# Patient Record
Sex: Female | Born: 1960 | Race: Black or African American | Hispanic: No | Marital: Single | Smoking: Current some day smoker
Health system: Southern US, Community
[De-identification: ages and names within clinical notes are randomized; demographics above are authoritative.]

## PROBLEM LIST (undated history)

## (undated) DIAGNOSIS — K589 Irritable bowel syndrome without diarrhea: Secondary | ICD-10-CM

## (undated) DIAGNOSIS — F32A Depression, unspecified: Secondary | ICD-10-CM

## (undated) DIAGNOSIS — I10 Essential (primary) hypertension: Secondary | ICD-10-CM

## (undated) DIAGNOSIS — F419 Anxiety disorder, unspecified: Secondary | ICD-10-CM

## (undated) DIAGNOSIS — F329 Major depressive disorder, single episode, unspecified: Secondary | ICD-10-CM

## (undated) DIAGNOSIS — F4310 Post-traumatic stress disorder, unspecified: Secondary | ICD-10-CM

## (undated) DIAGNOSIS — C801 Malignant (primary) neoplasm, unspecified: Secondary | ICD-10-CM

## (undated) DIAGNOSIS — E559 Vitamin D deficiency, unspecified: Secondary | ICD-10-CM

## (undated) HISTORY — DX: Irritable bowel syndrome, unspecified: K58.9

## (undated) HISTORY — DX: Post-traumatic stress disorder, unspecified: F43.10

## (undated) HISTORY — DX: Vitamin D deficiency, unspecified: E55.9

## (undated) HISTORY — PX: BREAST SURGERY: SHX581

## (undated) HISTORY — PX: CHOLECYSTECTOMY: SHX55

---

## 1997-12-15 ENCOUNTER — Inpatient Hospital Stay (HOSPITAL_COMMUNITY): Admission: EM | Admit: 1997-12-15 | Discharge: 1997-12-23 | Payer: Self-pay | Admitting: Emergency Medicine

## 2000-12-09 ENCOUNTER — Other Ambulatory Visit: Admission: RE | Admit: 2000-12-09 | Discharge: 2000-12-09 | Payer: Self-pay | Admitting: Obstetrics and Gynecology

## 2001-03-01 ENCOUNTER — Inpatient Hospital Stay (HOSPITAL_COMMUNITY): Admission: EM | Admit: 2001-03-01 | Discharge: 2001-03-08 | Payer: Self-pay | Admitting: *Deleted

## 2002-12-05 ENCOUNTER — Encounter: Payer: Self-pay | Admitting: Emergency Medicine

## 2002-12-05 ENCOUNTER — Emergency Department (HOSPITAL_COMMUNITY): Admission: EM | Admit: 2002-12-05 | Discharge: 2002-12-05 | Payer: Self-pay | Admitting: *Deleted

## 2003-01-16 ENCOUNTER — Ambulatory Visit (HOSPITAL_COMMUNITY): Admission: RE | Admit: 2003-01-16 | Discharge: 2003-01-16 | Payer: Self-pay | Admitting: *Deleted

## 2003-01-16 ENCOUNTER — Encounter: Payer: Self-pay | Admitting: *Deleted

## 2004-04-17 ENCOUNTER — Other Ambulatory Visit: Payer: Self-pay

## 2004-04-17 ENCOUNTER — Emergency Department: Payer: Self-pay | Admitting: Emergency Medicine

## 2004-06-19 ENCOUNTER — Emergency Department: Payer: Self-pay | Admitting: Emergency Medicine

## 2004-06-26 ENCOUNTER — Emergency Department: Payer: Self-pay | Admitting: Unknown Physician Specialty

## 2005-01-15 ENCOUNTER — Emergency Department: Payer: Self-pay | Admitting: Emergency Medicine

## 2005-07-06 ENCOUNTER — Emergency Department: Payer: Self-pay | Admitting: Unknown Physician Specialty

## 2007-06-19 ENCOUNTER — Emergency Department: Payer: Self-pay

## 2007-08-02 ENCOUNTER — Ambulatory Visit: Payer: Self-pay

## 2009-02-06 ENCOUNTER — Ambulatory Visit: Payer: Self-pay | Admitting: Pain Medicine

## 2011-09-24 ENCOUNTER — Emergency Department: Payer: Self-pay | Admitting: Emergency Medicine

## 2011-09-24 LAB — COMPREHENSIVE METABOLIC PANEL
Albumin: 4.1 g/dL (ref 3.4–5.0)
BUN: 15 mg/dL (ref 7–18)
Calcium, Total: 9.6 mg/dL (ref 8.5–10.1)
Chloride: 106 mmol/L (ref 98–107)
Co2: 25 mmol/L (ref 21–32)
EGFR (African American): >60
EGFR (Non-African Amer.): 59 — ABNORMAL LOW
Glucose: 87 mg/dL (ref 65–99)
Osmolality: 278 (ref 275–301)
Total Protein: 8.6 g/dL — ABNORMAL HIGH (ref 6.4–8.2)

## 2011-09-24 LAB — CBC
HCT: 40.8 % (ref 35.0–47.0)
HGB: 12.7 g/dL (ref 12.0–16.0)
MCV: 73 fL — ABNORMAL LOW (ref 80–100)

## 2011-09-24 LAB — SALICYLATE LEVEL: Salicylates, Serum: 1.9 mg/dL

## 2011-09-24 LAB — TSH: Thyroid Stimulating Horm: 12.3 u[IU]/mL — ABNORMAL HIGH

## 2011-09-24 LAB — ETHANOL: Ethanol %: <0.003 % (ref 0.000–0.080)

## 2011-09-24 LAB — ACETAMINOPHEN LEVEL: Acetaminophen: <2 ug/mL

## 2011-09-25 LAB — T4, FREE: Free Thyroxine: 0.84 ng/dL

## 2011-09-26 LAB — URINALYSIS, COMPLETE
Bacteria: NONE SEEN
Blood: NEGATIVE
Glucose,UR: NEGATIVE mg/dL (ref 0–75)
Ketone: NEGATIVE
Protein: NEGATIVE
Specific Gravity: 1.021 (ref 1.003–1.030)
Squamous Epithelial: 36
WBC UR: 8 /HPF (ref 0–5)

## 2011-09-26 LAB — DRUG SCREEN, URINE
Amphetamines, Ur Screen: NEGATIVE (ref ?–1000)
Benzodiazepine, Ur Scrn: NEGATIVE (ref ?–200)
Methadone, Ur Screen: NEGATIVE (ref ?–300)

## 2012-10-15 ENCOUNTER — Emergency Department: Payer: Self-pay | Admitting: Emergency Medicine

## 2013-06-10 ENCOUNTER — Emergency Department: Payer: Self-pay | Admitting: Internal Medicine

## 2013-08-30 ENCOUNTER — Emergency Department: Payer: Self-pay | Admitting: Emergency Medicine

## 2013-08-30 LAB — DRUG SCREEN, URINE
Amphetamines, Ur Screen: NEGATIVE (ref ?–1000)
Barbiturates, Ur Screen: NEGATIVE (ref ?–200)
Benzodiazepine, Ur Scrn: NEGATIVE (ref ?–200)
CANNABINOID 50 NG, UR ~~LOC~~: POSITIVE (ref ?–50)
Cocaine Metabolite,Ur ~~LOC~~: POSITIVE (ref ?–300)
MDMA (Ecstasy)Ur Screen: NEGATIVE (ref ?–500)
Methadone, Ur Screen: NEGATIVE (ref ?–300)
Opiate, Ur Screen: NEGATIVE (ref ?–300)
Phencyclidine (PCP) Ur S: NEGATIVE (ref ?–25)
Tricyclic, Ur Screen: NEGATIVE (ref ?–1000)

## 2013-08-30 LAB — URINALYSIS, COMPLETE
Bacteria: NONE SEEN
Bilirubin,UR: NEGATIVE
Blood: NEGATIVE
Glucose,UR: NEGATIVE mg/dL (ref 0–75)
Leukocyte Esterase: NEGATIVE
NITRITE: NEGATIVE
PH: 5 (ref 4.5–8.0)
Protein: NEGATIVE
RBC,UR: NONE SEEN /HPF (ref 0–5)
SPECIFIC GRAVITY: 1.028 (ref 1.003–1.030)
Squamous Epithelial: 4
WBC UR: <1 /HPF (ref 0–5)

## 2013-08-30 LAB — ACETAMINOPHEN LEVEL: Acetaminophen: <2 ug/mL

## 2013-08-30 LAB — COMPREHENSIVE METABOLIC PANEL
AST: 38 U/L — AB (ref 15–37)
Albumin: 3.9 g/dL (ref 3.4–5.0)
Alkaline Phosphatase: 59 U/L
Anion Gap: 3 — ABNORMAL LOW (ref 7–16)
BUN: 16 mg/dL (ref 7–18)
Bilirubin,Total: 0.5 mg/dL (ref 0.2–1.0)
CALCIUM: 9.3 mg/dL (ref 8.5–10.1)
CREATININE: 1.13 mg/dL (ref 0.60–1.30)
Chloride: 108 mmol/L — ABNORMAL HIGH (ref 98–107)
Co2: 28 mmol/L (ref 21–32)
EGFR (African American): >60
GFR CALC NON AF AMER: 55 — AB
Glucose: 92 mg/dL (ref 65–99)
Osmolality: 278 (ref 275–301)
Potassium: 4.3 mmol/L (ref 3.5–5.1)
SGPT (ALT): 29 U/L (ref 12–78)
Sodium: 139 mmol/L (ref 136–145)
Total Protein: 8.2 g/dL (ref 6.4–8.2)

## 2013-08-30 LAB — CBC
HCT: 37.6 % (ref 35.0–47.0)
HGB: 12 g/dL (ref 12.0–16.0)
MCH: 23.3 pg — ABNORMAL LOW (ref 26.0–34.0)
MCHC: 31.9 g/dL — AB (ref 32.0–36.0)
MCV: 73 fL — AB (ref 80–100)
PLATELETS: 233 10*3/uL (ref 150–440)
RBC: 5.15 10*6/uL (ref 3.80–5.20)
RDW: 15.5 % — AB (ref 11.5–14.5)
WBC: 6.3 10*3/uL (ref 3.6–11.0)

## 2013-08-30 LAB — CK TOTAL AND CKMB (NOT AT ARMC)
CK, Total: 511 U/L — ABNORMAL HIGH
CK-MB: 6.1 ng/mL — ABNORMAL HIGH (ref 0.5–3.6)

## 2013-08-30 LAB — ETHANOL

## 2013-08-30 LAB — D-DIMER(ARMC): D-DIMER: 725 ng/mL

## 2013-08-30 LAB — TSH: Thyroid Stimulating Horm: 5.5 u[IU]/mL — ABNORMAL HIGH

## 2013-08-30 LAB — LIPASE, BLOOD: Lipase: 105 U/L (ref 73–393)

## 2013-08-30 LAB — TROPONIN I: Troponin-I: <0.02 ng/mL

## 2013-08-30 LAB — SALICYLATE LEVEL: Salicylates, Serum: 2.5 mg/dL

## 2013-08-31 LAB — TROPONIN I

## 2013-12-19 LAB — CBC
HCT: 38.9 % (ref 35.0–47.0)
HGB: 12.2 g/dL (ref 12.0–16.0)
MCH: 22.5 pg — ABNORMAL LOW (ref 26.0–34.0)
MCHC: 31.3 g/dL — AB (ref 32.0–36.0)
MCV: 72 fL — ABNORMAL LOW (ref 80–100)
Platelet: 225 10*3/uL (ref 150–440)
RBC: 5.4 10*6/uL — AB (ref 3.80–5.20)
RDW: 14.1 % (ref 11.5–14.5)
WBC: 4.9 10*3/uL (ref 3.6–11.0)

## 2013-12-19 LAB — COMPREHENSIVE METABOLIC PANEL
ALBUMIN: 3.4 g/dL (ref 3.4–5.0)
ALT: 21 U/L
ANION GAP: 7 (ref 7–16)
AST: 27 U/L (ref 15–37)
Alkaline Phosphatase: 61 U/L
BUN: 14 mg/dL (ref 7–18)
Bilirubin,Total: 0.3 mg/dL (ref 0.2–1.0)
CO2: 26 mmol/L (ref 21–32)
CREATININE: 1.23 mg/dL (ref 0.60–1.30)
Calcium, Total: 9.2 mg/dL (ref 8.5–10.1)
Chloride: 108 mmol/L — ABNORMAL HIGH (ref 98–107)
EGFR (Non-African Amer.): 49 — ABNORMAL LOW
GFR CALC AF AMER: 57 — AB
GLUCOSE: 70 mg/dL (ref 65–99)
OSMOLALITY: 280 (ref 275–301)
Potassium: 3.7 mmol/L (ref 3.5–5.1)
Sodium: 141 mmol/L (ref 136–145)
Total Protein: 7.7 g/dL (ref 6.4–8.2)

## 2013-12-19 LAB — DRUG SCREEN, URINE
Amphetamines, Ur Screen: NEGATIVE (ref ?–1000)
Barbiturates, Ur Screen: NEGATIVE (ref ?–200)
Benzodiazepine, Ur Scrn: NEGATIVE (ref ?–200)
Cannabinoid 50 Ng, Ur ~~LOC~~: POSITIVE (ref ?–50)
Cocaine Metabolite,Ur ~~LOC~~: POSITIVE (ref ?–300)
MDMA (Ecstasy)Ur Screen: NEGATIVE (ref ?–500)
Methadone, Ur Screen: NEGATIVE (ref ?–300)
OPIATE, UR SCREEN: NEGATIVE (ref ?–300)
PHENCYCLIDINE (PCP) UR S: NEGATIVE (ref ?–25)
Tricyclic, Ur Screen: NEGATIVE (ref ?–1000)

## 2013-12-19 LAB — URINALYSIS, COMPLETE
BLOOD: NEGATIVE
Bilirubin,UR: NEGATIVE
Glucose,UR: 50 mg/dL (ref 0–75)
Ketone: NEGATIVE
Leukocyte Esterase: NEGATIVE
Nitrite: NEGATIVE
PROTEIN: NEGATIVE
Ph: 6 (ref 4.5–8.0)
RBC,UR: 4 /HPF (ref 0–5)
Specific Gravity: 1.021 (ref 1.003–1.030)
Squamous Epithelial: 2
WBC UR: 1 /HPF (ref 0–5)

## 2013-12-19 LAB — ETHANOL

## 2013-12-19 LAB — SALICYLATE LEVEL: Salicylates, Serum: 1.9 mg/dL

## 2013-12-19 LAB — ACETAMINOPHEN LEVEL

## 2013-12-20 ENCOUNTER — Inpatient Hospital Stay: Payer: Self-pay | Admitting: Psychiatry

## 2013-12-22 LAB — TSH: THYROID STIMULATING HORM: 2.97 u[IU]/mL

## 2013-12-22 LAB — HEMOGLOBIN A1C: Hemoglobin A1C: 7 % — ABNORMAL HIGH (ref 4.2–6.3)

## 2013-12-22 LAB — LIPID PANEL
Cholesterol: 160 mg/dL (ref 0–200)
HDL Cholesterol: 57 mg/dL (ref 40–60)
LDL CHOLESTEROL, CALC: 88 mg/dL (ref 0–100)
Triglycerides: 76 mg/dL (ref 0–200)
VLDL Cholesterol, Calc: 15 mg/dL (ref 5–40)

## 2014-02-21 ENCOUNTER — Emergency Department: Payer: Self-pay | Admitting: Emergency Medicine

## 2014-07-10 ENCOUNTER — Emergency Department: Payer: Self-pay | Admitting: Student

## 2014-09-07 NOTE — Consult Note (Signed)
Brief Consult Note: Diagnosis: PTSD.   Patient was seen by consultant.   Consult note dictated.   Discussed with Attending MD.   Comments: Psychiatry: PAtient seen. Chart reviewed. Denies any thought of hurting self Or of hurting boyfriend. Calm and lucid and appropriate current behavior. Boyfriend reports not believing she will hurt hhim . Has outpt treatment. Will release IVC.  Electronic Signatures: Clapacs, Madie Reno (MD)  (Signed 17-Apr-15 15:05)  Authored: Brief Consult Note   Last Updated: 17-Apr-15 15:05 by Gonzella Lex (MD)

## 2014-09-07 NOTE — Consult Note (Signed)
PATIENT NAME:  Helen Reid, FESTA MR#:  [id number] DATE OF BIRTH:  09-May-1961  DATE OF CONSULTATION:  12/20/2013  REFERRING PHYSICIAN:   CONSULTING PHYSICIAN:  Gonzella Lex, MD  IDENTIFYING INFORMATION AND REASON FOR CONSULT: This is a 54 year old woman who came into the Emergency Room reporting suicidal ideation and also having taken an overdose of an unknown number of pills. Consultation for appropriate disposition.   HISTORY OF PRESENT ILLNESS: History obtained from the patient and the chart and the collateral information from her home health aide. The patient tells me that she has been feeling terribly depressed for at least 5 days or so, been having suicidal ideation. She claims now that she tried to kill herself by taking a bunch of pills, but does not remember what they are. She apparently had not reported this before. She identifies multiple stresses, feeling like she is all alone, nobody cares about her, her children do not pay attention to her. It also sounds like she probably had a break-up with her boyfriend with whom she has a chronically tumultuous relationship. She complains about her chronic pain, which has been a long-standing issue. Claims that she is still taking all of her medication as prescribed. Still gets in home care on a regular basis.   PAST PSYCHIATRIC HISTORY: Long-standing psychiatric illness. Multiple hospitalizations in the past, but it has several years since then. She has had a couple of evaluations at our Emergency Room, however. Her diagnoses include multiple personality disorder and posttraumatic stress disorder, apparently. She does have a history of suicide attempts in the past. Has a history of aggressive behavior and threatening behavior in the past as well. Currently, she is taking trazodone 150 mg at night, Abilify 10 mg once a day, etodolac 400 mg once a day, fenofibrate 135 mg once a day, gabapentin 400 mg 3 times a day, levothyroxine 200 mcg per day.    ALLERGIES: ASPIRIN, DILANTIN, ERYTHROMYCIN AND PHENOBARBITAL.  HOME CARE: She is followed by a peer support and in-home caretaker with care coordinated by [facility].   PAST MEDICAL HISTORY: Hypothyroid and chronic pain. It looks like her chronic pain has been attributed vaguely to fibromyalgia or osteoarthritis in the past. She is not currently getting any pain medicine besides the etodolac.   SOCIAL HISTORY: The patient is not married, just broke up with her boyfriend. She has had a chaotic relationship with him in the past that has at times involved threatening behavior. She does have 4 children, but they do not stay in touch with her. She is living independently, but has in home care regularly, not working.   FAMILY HISTORY: None identified.   SUBSTANCE ABUSE HISTORY: The patient admits to frequent use of marijuana, minimizes this, does not seem to think it is a problem. Drinks relatively infrequently and denies other drug abuse.   REVIEW OF SYSTEMS: Depressed mood, hopelessness, suicidal ideation with ongoing wish to die. Denies hallucinations. Has diffuse pain all over, especially in her hands and knees.   CURRENT MEDICATIONS: Venlafaxine 75 mg once a day, Amitiza 24 mcg twice a day, trazodone 150 mg at night, Abilify 10 mg a day, etodolac 400 mg once a day, fenofibrate 135 mg once a day, gabapentin 400 mg 3 times a day, levothyroxine 200 mcg per day.   MENTAL STATUS EXAMINATION: Adequately groomed woman, looks her stated age. Today she was initially hostile again but warmed up and was gradually able to give some history. Speech is decreased in total amount and quiet,  but became more normal as we conversed. Affect tearful throughout, very sad. Psychomotor activity very slow and sluggish. Prefers to stay in bed curled up. Mood is depressed. Thoughts are lucid. No obvious delusions. Denies hallucinations. Endorses suicidal ideation. No homicidal intent right now, although she talks a lot about  hating people. The patient is alert and oriented x4. Short-and long term memory appear grossly intact. Normal intelligence.   LABORATORY RESULTS: Labs were done yesterday in the Emergency Room and included drug screen positive for cocaine and cannabis. Chemistry panel: No significant abnormalities. Alcohol level negative. CBC: Low MCV at 72, otherwise fairly unremarkable. Urinalysis borderline, but probably not infected.   ASSESSMENT: A 54 year old woman with post-traumatic stress disorder, multiple personality disorder, borderline and antisocial features. Currently very sad and depressed, tearful and endorsing suicidal ideation and in need of hospital treatment. I saw her yesterday in the ER and she was very angry and agitated then, but today she is able to cooperate with treatment planning.   TREATMENT PLAN: Admit to psychiatry. Reviewed medications. Continue current medicines as ordered. Reviewed lab studies. Her Tylenol level was negative so whatever it was that she supposedly took in overdose did not include acetaminophen. Encouraged the patient to engage herself in groups on the unit and talk with her treatment team downstairs.   DIAGNOSIS, PRINCIPAL AND PRIMARY:  AXIS I: Major depression, recurrent, severe.   SECONDARY DIAGNOSES: AXIS I:  1.  Posttraumatic stress disorder. 2.  Multiple personality disorder by history.  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  AXIS III: Hypothyroid, chronic pain, diffuse arthritis.  ____________________________ Gonzella Lex, MD jtc:sb D: 12/20/2013 15:54:12 ET T: 12/20/2013 16:13:47 ET JOB#: [id number]  cc: Gonzella Lex, MD, <Dictator> Gonzella Lex MD ELECTRONICALLY SIGNED 01/25/2014 16:54

## 2014-09-07 NOTE — H&P (Signed)
PATIENT NAME:  Helen Reid, Helen Reid MR#:  [id number] DATE OF BIRTH:  Sep 22, 1960  DATE OF ADMISSION:  12/20/2013  DATE OF EVALUATION: 12/21/2013  REFERRING PHYSICIAN: Emergency Room MD   ATTENDING PHYSICIAN: Rye Decoste B. Bary Leriche, MD   IDENTIFYING DATA: Helen Reid is a 54 year old female with history of bipolar depression.   CHIEF COMPLAINT: "I'm suicidal."   HISTORY OF PRESENT ILLNESS: Helen Reid has a long history of mental illness with multiple hospitalizations. She has been stable on her medications prescribed by Dr. Jacqualine Code at [facility]. However, in the past week, with new stressors, she became increasingly depressed, anxious, and suicidal. She was thinking of hanging herself. She works with [facility] support team and they recommended that she comes to the hospital. She struggles with multiple life problems, is separated from her 4 children and does not see them very much, but most importantly she terminated a relationship with no good boyfriend after 8 years. It was harder than she anticipated. She complains of poor sleep, decreased appetite, anhedonia, feeling of guilt, hopelessness, worthlessness, poor memory and concentration, low energy, social isolation, crying spells and now suicidal ideation. She denies excessive drinking or illicit substance use. She denies psychotic symptoms, denies symptoms suggestive of bipolar mania. Her anxiety is a little worse, but no panic attacks.   PAST PSYCHIATRIC HISTORY: She has been hospitalized multiple times since she was a child. There are several suicide attempts by overdose and cutting. No hanging so far. She has been tried on multiple medications and feels that the current combination has been working well, but maybe it needs to be adjusted given recent events.   FAMILY PSYCHIATRIC HISTORY: None reported.   PAST MEDICAL HISTORY: Obesity, constipation, osteoarthritis, dyslipidemia, hypothyroidism, diabetes, restless leg syndrome.   ALLERGIES: ASPIRIN,  DILANTIN, ERYTHROMYCIN, PENICILLIN, PHENOBARBITAL, PEANUTS, STRAWBERRIES.  MEDICATIONS ON ADMISSION: Colace 100 mg daily, etodolac 400 mg daily, fenofibrate 145 mg daily, Neurontin 400 mg 3 times daily, Synthroid 0.2 mg daily, senna 1 tablet daily, Abilify 10 mg daily, Amitiza 24 mcg daily, Effexor-XR 75 mg daily.   SOCIAL HISTORY: She lives independently. She has community support and a home health nurse coming to her house for 3 hours a day to help her with her ADLs. She is disabled. She has insurance.   REVIEW OF SYSTEMS: CONSTITUTIONAL: No fevers or chills. Positive for fatigue.  EYES: No double or blurred vision.  ENT: No hearing loss.  RESPIRATORY: No shortness of breath or cough.  CARDIOVASCULAR: No chest pain or orthopnea.  GASTROINTESTINAL: No abdominal pain, nausea, vomiting, or diarrhea.  GENITOURINARY: No incontinence or frequency.  ENDOCRINE: No heat or cold intolerance.  LYMPHATIC: No anemia or easy bruising.  INTEGUMENTARY: No acne or rash.  MUSCULOSKELETAL: No muscle or joint pain.  NEUROLOGIC: No tingling or weakness.  PSYCHIATRIC: See history of present illness for details.   PHYSICAL EXAMINATION: VITAL SIGNS: Blood pressure 108/55, pulse 54, respirations 16, temperature 98.3.  GENERAL: This is an obese female in no acute distress.  HEENT: The pupils are equal, round and reactive to light. Sclerae anicteric.  NECK: Supple. No thyromegaly.  LUNGS: Clear to auscultation. No dullness to percussion.  HEART: Regular rhythm and rate. No murmurs, rubs, or gallops.  ABDOMEN: Soft, nontender, nondistended. Positive bowel sounds.  MUSCULOSKELETAL: Normal muscle strength in all extremities.  SKIN: No rashes or bruises.  LYMPHATIC: No cervical adenopathy.  NEUROLOGIC: Cranial nerves II through XII are intact.   LABORATORY DATA: Chemistries are within normal limits. Blood alcohol level is zero. LFTs  within normal limits. Urine tox screen positive for cocaine and cannabinoids.  CBC within normal limits with MCV of 72. Urinalysis is not suggestive of urinary tract infection. Serum acetaminophen and salicylates are low.   MENTAL STATUS EXAMINATION ON ADMISSION: The patient is alert and oriented to person, place, time and situation. She is pleasant, polite and marginally cooperative. There is severe psychomotor retardation. No eye contact. She claims that she has multiple personality disorder, but allows me to speak to [place]. Her grooming is questionable, wearing hospital scrubs. Her speech is slow, poverty of speech. Mood is depressed with flat affect. Thought process is logical with its own logic. She endorses suicidal ideation, but is able to contract for safety. In the hospital, there is no homicidal ideation. She denies delusions or paranoia. There are no auditory or visual hallucinations. Her cognition is not easy to assess but seems normal. Registration and recall are normal. She is a fair historian. Long and short-term memory intact. She is of average intelligence and fund of knowledge. She has fair insight and judgment.   SUICIDE RISK ASSESSMENT: This is a patient with history of mood instability and multiple suicide attempts who came to the hospital suicidal again in the context of multiple social stressors and relationship problems.   INITIAL DIAGNOSES:  AXIS I:  1.  Bipolar I disorder, depressed. 2.  Cocaine abuse.  3.  Cannabis abuse. 4.  Nicotine dependence.  AXIS II: Personality disorder, not otherwise specified.  AXIS III: Diabetes, restless leg syndrome, osteoarthritis, constipation, hypothyroidism, dyslipidemia.  AXIS IV: Mental illness, primary support, relationship, family conflict.  AXIS V: Global assessment of functioning 25.   PLAN: The patient was admitted to [facility] unit for safety, stabilization and medication management. She was initially placed on suicide precautions and was closely monitored for any  unsafe behavior. She underwent full psychiatric and risk assessment. She received pharmacotherapy, individual and group psychotherapy, substance abuse counseling, and support from therapeutic milieu.  1.  Suicidal ideation: She is able to contract for safety.  2.  Mood: We will increase Abilify to 20 mg daily and add Tegretol for mood stabilization. Will increase Effexor-XR to 150, maybe to 25.  3.  Medical: We will continue all other medicines as in the community.  4.  Insomnia: We will add Ambien. 5.  Restless leg: We will start Requip.  6.  Disposition: She will be discharged back to her apartment with [facility] at follow-up.  ____________________________ Wardell Honour. Bary Leriche, MD jbp:sb D: 12/21/2013 16:10:43 ET T: 12/21/2013 16:47:03 ET JOB#: [id number]  cc: Emryn Flanery B. Bary Leriche, MD, <Dictator> Clovis Fredrickson MD ELECTRONICALLY SIGNED 12/29/2013 2:32

## 2014-09-07 NOTE — Consult Note (Signed)
PATIENT NAME:  Helen Reid, BIDDLE MR#:  [id number] DATE OF BIRTH:  1961/02/20  DATE OF CONSULTATION:  08/31/2013  REFERRING PHYSICIAN:   CONSULTING PHYSICIAN:  Gonzella Lex, MD  IDENTIFYING INFORMATION AND REASON FOR CONSULTATION: A 54 year old woman with chronic mental health issues who came to the Emergency Room initially complaining of chest pain, but then was put on IVC because of allegations that she had threatened her boyfriend. Consultation for appropriate treatment.   HISTORY OF PRESENT ILLNESS: Information obtained from the patient and the chart. The patient is said to have allegedly threatened to shoot her boyfriend yesterday when she went to see her therapist at [facility]. She has consistently denied making this comment since she has been here in the Emergency Room. To me she absolutely denied ever making any such complaint. She said she has been angry at him because she thinks that he stole her gun, but denies having any thought of wanting to hurt him. She says that her mood generally has been pretty good. Not overly depressed, not manic or enraged. She sleeps fine with her current medicine. She does not have any new physical symptoms. She denies hallucinations. Denies suicidal ideation. The patient gets follow-up treatment for her multiple personality disorder and chronic anxiety disorder at [facility] and takes medication.   PAST PSYCHIATRIC HISTORY: The patient has had a long history of psychiatric problem going back many years. She has had hospitalizations in the past, but it has been years since her last one. She denies any recent suicide attempts. She says she has been aggressive in the past to people who have been violent to her first, but has not been violent in a long time. She is currently being seen at [facility] by a therapist and also takes by her report Abilify, Neurontin, gabapentin, and Effexor prescribed by Dr. Jacqualine Code. She was seen in our Emergency Room once previously a couple of years ago and was  released at that time.   SUBSTANCE ABUSE HISTORY: She admits that she drinks occasionally but says that alcohol is not a regular problem. Uses marijuana on a fairly regular basis, which has been chronic. Denies any other substance abuse.   SOCIAL HISTORY: She lives with her boyfriend. She is on disability. Does not have very much else going on in her life most of the time.   FAMILY HISTORY: Positive for substance abuse.   PAST MEDICAL HISTORY: States that she has hypothyroidism. Does not identify any other medical problems.   REVIEW OF SYSTEMS: Currently states that her mood is a little bit irritated but not enraged. Denies depression. Denies suicidal or homicidal ideation. Denies hallucinations. Denies any new acute pain or gut symptoms. The whole rest of the 10 point review of systems is negative.   MENTAL STATUS EXAMINATION: Slightly disheveled woman, looks her stated age or younger, cooperative with the interview. Eye contact good. Psychomotor activity a little fidgety. Speech is loud at times but not pressured. Affect irritated but not hostile or threatening. Appropriately modulated. Mood stated as being good. Thoughts are lucid with no loosening of associations. No sign of delusional thinking. Denies auditory or visual hallucinations. Denies suicidal or homicidal ideation. Says that she understands the trouble she can get into from being violent with someone and says that she is not stupid enough to do something that would get her in jail. She is alert and oriented x4. Short and long-term memory intact to testing. Normal fund of knowledge.   LABORATORY RESULTS: Labs performed yesterday include elevated  TSH of 5.5. Lipase normal. Drug screen positive for cocaine and cannabis. Chemistry panel: Chloride elevated at 108, total CK elevated 511. Hematology panel: Low MCV but otherwise normal indices for the most part. Troponin is negative.   ASSESSMENT: A 54 year old woman with a history of chronic  anxiety symptoms, diagnosis of multiple personality disorder and PTSD, went to see her therapist today, made a comment that was interpreted as threatening to her boyfriend. Since then she has consistently denied it. Right now she does not appear to be manic or psychotic. Absolutely denies any intention to hurt anyone. Nursing contacted the boyfriend yesterday and he said he did not feel afraid of her and was not concerned that he was in any danger. At this point, she appears to be at her baseline mental state. She has been educated about the importance of following up with continued outpatient treatment, which she is already aware of. She no longer meets commitment criteria and can be discharged from the Emergency Room with follow-up at [facility].   DIAGNOSIS, PRINCIPAL AND PRIMARY:  AXIS I: Posttraumatic stress disorder.   SECONDARY DIAGNOSES: AXIS I: Dissociative identity disorder.  AXIS II: Deferred.  AXIS III: Hypothyroidism. AXIS IV: Moderate, chronic from illness.  AXIS V: Functioning at time of evaluation is 54. ____________________________ Gonzella Lex, MD jtc:sb D: 08/31/2013 15:12:32 ET T: 08/31/2013 17:19:01 ET JOB#: [id number]  cc: Gonzella Lex, MD, <Dictator> Gonzella Lex MD ELECTRONICALLY SIGNED 09/02/2013 13:18

## 2014-09-07 NOTE — Consult Note (Signed)
Brief Consult Note: Diagnosis: ptsd.   Patient was seen by consultant.   Recommend further assessment or treatment.   Comments: Psychiatry: Attempted to see patient. She at first would not talk and then abruptly became hotile and threatening. Not possible to safely evvaluate patient at this time. Will reevaluate once she is calmer. Will make sure outpt meds ordered.  Electronic Signatures: Gonzella Lex (MD)  (Signed 05-Aug-15 15:11)  Authored: Brief Consult Note   Last Updated: 05-Aug-15 15:11 by Gonzella Lex (MD)
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
## 2014-10-09 ENCOUNTER — Ambulatory Visit: Payer: Medicaid Other | Admitting: Anesthesiology

## 2014-10-17 ENCOUNTER — Ambulatory Visit (INDEPENDENT_AMBULATORY_CARE_PROVIDER_SITE_OTHER): Payer: Medicaid Other | Admitting: Family Medicine

## 2014-10-17 ENCOUNTER — Other Ambulatory Visit: Payer: Self-pay | Admitting: Family Medicine

## 2014-10-17 ENCOUNTER — Encounter: Payer: Self-pay | Admitting: Family Medicine

## 2014-10-17 VITALS — BP 118/70 | HR 69 | Temp 98.0°F | Resp 16 | Ht 64.0 in | Wt 161.6 lb

## 2014-10-17 DIAGNOSIS — K581 Irritable bowel syndrome with constipation: Secondary | ICD-10-CM | POA: Insufficient documentation

## 2014-10-17 DIAGNOSIS — R21 Rash and other nonspecific skin eruption: Secondary | ICD-10-CM | POA: Diagnosis not present

## 2014-10-17 DIAGNOSIS — J449 Chronic obstructive pulmonary disease, unspecified: Secondary | ICD-10-CM | POA: Insufficient documentation

## 2014-10-17 DIAGNOSIS — M2550 Pain in unspecified joint: Secondary | ICD-10-CM | POA: Insufficient documentation

## 2014-10-17 DIAGNOSIS — N182 Chronic kidney disease, stage 2 (mild): Secondary | ICD-10-CM | POA: Insufficient documentation

## 2014-10-17 DIAGNOSIS — E039 Hypothyroidism, unspecified: Secondary | ICD-10-CM | POA: Insufficient documentation

## 2014-10-17 DIAGNOSIS — E781 Pure hyperglyceridemia: Secondary | ICD-10-CM | POA: Insufficient documentation

## 2014-10-17 DIAGNOSIS — I351 Nonrheumatic aortic (valve) insufficiency: Secondary | ICD-10-CM | POA: Insufficient documentation

## 2014-10-17 MED ORDER — BACITRACIN-NEOMYCIN-POLYMYXIN 400-5-5000 EX OINT: 1.0000 "application " | TOPICAL_OINTMENT | Freq: Two times a day (BID) | CUTANEOUS | Status: DC
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
## 2014-10-28 ENCOUNTER — Telehealth: Payer: Self-pay | Admitting: Family Medicine

## 2014-10-28 DIAGNOSIS — R21 Rash and other nonspecific skin eruption: Secondary | ICD-10-CM

## 2014-10-28 MED ORDER — DOXYCYCLINE MONOHYDRATE 100 MG PO TABS: 100.0000 mg | ORAL_TABLET | Freq: Two times a day (BID) | ORAL | Status: DC

## 2014-10-28 MED ORDER — BACITRACIN-NEOMYCIN-POLYMYXIN 400-5-5000 EX OINT: 1.0000 "application " | TOPICAL_OINTMENT | Freq: Two times a day (BID) | CUTANEOUS | Status: DC

## 2014-10-28 NOTE — Telephone Encounter (Signed)
Patient has been notified about her prescriptions.

## 2014-10-28 NOTE — Telephone Encounter (Signed)
Sent in Doxycycline and Neosporin Rx but her insurance may not cover OTC medications like Neosporin and Benadryl which can be found OTC.

## 2014-12-09 ENCOUNTER — Telehealth: Payer: Self-pay | Admitting: Family Medicine

## 2014-12-09 NOTE — Telephone Encounter (Signed)
Referral to pain management placed 09/25/2014 in [employer] EMR. Advise patient to contact pain clinic directly to inquire about appointment status.

## 2014-12-09 NOTE — Telephone Encounter (Signed)
Checking status on referral for Pain Clinic. The pain clinic called the patient about 70months ago stating that the doctor had not completed his training and that someone would call her back. As of today she has not heard anything. Please advise

## 2014-12-16 ENCOUNTER — Telehealth: Payer: Self-pay | Admitting: Family Medicine

## 2015-01-01 NOTE — Telephone Encounter (Signed)
Erroneous Entry  

## 2015-02-04 ENCOUNTER — Encounter: Payer: Self-pay | Admitting: Emergency Medicine

## 2015-02-04 ENCOUNTER — Emergency Department
Admission: EM | Admit: 2015-02-04 | Discharge: 2015-02-04 | Disposition: A | Discharge status: Home / Self Care | Payer: Medicaid Other | Attending: Emergency Medicine | Admitting: Emergency Medicine

## 2015-02-04 DIAGNOSIS — Z79899 Other long term (current) drug therapy: Secondary | ICD-10-CM | POA: Insufficient documentation

## 2015-02-04 DIAGNOSIS — F419 Anxiety disorder, unspecified: Secondary | ICD-10-CM | POA: Insufficient documentation

## 2015-02-04 DIAGNOSIS — R51 Headache: Secondary | ICD-10-CM | POA: Insufficient documentation

## 2015-02-04 DIAGNOSIS — Z7952 Long term (current) use of systemic steroids: Secondary | ICD-10-CM | POA: Insufficient documentation

## 2015-02-04 DIAGNOSIS — K2970 Gastritis, unspecified, without bleeding: Secondary | ICD-10-CM | POA: Insufficient documentation

## 2015-02-04 DIAGNOSIS — Z880 Allergy status to penicillin: Secondary | ICD-10-CM | POA: Insufficient documentation

## 2015-02-04 DIAGNOSIS — Z720 Tobacco use: Secondary | ICD-10-CM | POA: Insufficient documentation

## 2015-02-04 DIAGNOSIS — R112 Nausea with vomiting, unspecified: Secondary | ICD-10-CM | POA: Diagnosis present

## 2015-02-04 DIAGNOSIS — I129 Hypertensive chronic kidney disease with stage 1 through stage 4 chronic kidney disease, or unspecified chronic kidney disease: Secondary | ICD-10-CM | POA: Diagnosis not present

## 2015-02-04 DIAGNOSIS — N182 Chronic kidney disease, stage 2 (mild): Secondary | ICD-10-CM | POA: Diagnosis not present

## 2015-02-04 HISTORY — DX: Depression, unspecified: F32.A

## 2015-02-04 HISTORY — DX: Essential (primary) hypertension: I10

## 2015-02-04 HISTORY — DX: Major depressive disorder, single episode, unspecified: F32.9

## 2015-02-04 HISTORY — DX: Anxiety disorder, unspecified: F41.9

## 2015-02-04 HISTORY — DX: Malignant (primary) neoplasm, unspecified: C80.1

## 2015-02-04 LAB — LIPASE, BLOOD: Lipase: 33 U/L (ref 22–51)

## 2015-02-04 LAB — BASIC METABOLIC PANEL
Anion gap: 6 (ref 5–15)
BUN: 14 mg/dL (ref 6–20)
CHLORIDE: 106 mmol/L (ref 101–111)
CO2: 30 mmol/L (ref 22–32)
CREATININE: 1.44 mg/dL — AB (ref 0.44–1.00)
Calcium: 9.3 mg/dL (ref 8.9–10.3)
GFR calc Af Amer: 46 mL/min — ABNORMAL LOW (ref >60)
GFR calc non Af Amer: 40 mL/min — ABNORMAL LOW (ref >60)
Glucose, Bld: 76 mg/dL (ref 65–99)
Potassium: 4.2 mmol/L (ref 3.5–5.1)
SODIUM: 142 mmol/L (ref 135–145)

## 2015-02-04 LAB — URINALYSIS COMPLETE WITH MICROSCOPIC (ARMC ONLY)
BILIRUBIN URINE: NEGATIVE
GLUCOSE, UA: NEGATIVE mg/dL
Hgb urine dipstick: NEGATIVE
KETONES UR: NEGATIVE mg/dL
Nitrite: NEGATIVE
Protein, ur: NEGATIVE mg/dL
Specific Gravity, Urine: 1.016 (ref 1.005–1.030)
pH: 6 (ref 5.0–8.0)

## 2015-02-04 LAB — CBC
HCT: 35.6 % (ref 35.0–47.0)
Hemoglobin: 11.3 g/dL — ABNORMAL LOW (ref 12.0–16.0)
MCH: 22.9 pg — AB (ref 26.0–34.0)
MCHC: 31.6 g/dL — ABNORMAL LOW (ref 32.0–36.0)
MCV: 72.6 fL — AB (ref 80.0–100.0)
PLATELETS: 176 10*3/uL (ref 150–440)
RBC: 4.91 MIL/uL (ref 3.80–5.20)
RDW: 16.9 % — AB (ref 11.5–14.5)
WBC: 3.9 10*3/uL (ref 3.6–11.0)

## 2015-02-04 MED ORDER — ONDANSETRON 4 MG PO TBDP
4.0000 mg | ORAL_TABLET | Freq: Once | ORAL | Status: AC | PRN
  Administered 2015-02-04: 4 mg via ORAL
  Filled 2015-02-04: qty 1

## 2015-02-04 MED ORDER — ONDANSETRON HCL 4 MG PO TABS: 4.0000 mg | ORAL_TABLET | Freq: Every day | ORAL | Status: DC | PRN

## 2015-02-04 NOTE — ED Notes (Signed)
Pt reports dizziness and vomiting today; reports hasn't been on medications for 3 months (anxiety, depression, hypertension).

## 2015-02-04 NOTE — ED Notes (Signed)
States she has had headaches body aches with nausea and vomiting for a "while" last time vomited was in lobby ..states her MD is at   [facility] but does not have transportation . Also has not take her meds for 2-3 mos. Also having some chest discomfort

## 2015-02-04 NOTE — ED Provider Notes (Signed)
[facility] Emergency Department Provider Note  ____________________________________________  Time seen: 12 PM  I have reviewed the triage vital signs and the nursing notes.   HISTORY  Chief Complaint Emesis and Dizziness    HPI Helen Reid is a 54 y.o. female who complains of "all over pain "anxiety and intermittent headache for 3 months and she has not been taking her medications for 3 months because she says she develops hypersensitivity to them. She denies fevers chills. She has had nausea today but reports she has nausea every day. Apparently she vomited in the waiting room. She asks me to figure out what is wrong with her. She denies chest pain she denies short of breath. It appears that most of her concerns are chronic unfortunately     Past Medical History  Diagnosis Date  . Hypertension   . Anxiety   . Depression   . Cancer     lymphoma, gallbladder, breast    Patient Active Problem List   Diagnosis Date Noted  . CAFL (chronic airflow limitation) 10/17/2014  . Chronic kidney disease (CKD), stage II (mild) 10/17/2014  . Hypertriglyceridemia 10/17/2014  . Adult hypothyroidism 10/17/2014  . Insomnia, persistent 10/17/2014  . Anemia, iron deficiency 10/17/2014  . Irritable bowel syndrome with constipation 10/17/2014  . Major depressive disorder, recurrent, with catatonic features 10/17/2014  . AI (aortic incompetence) 10/17/2014  . Arthralgia of multiple joints 10/17/2014  . Cutaneous eruption 10/17/2014    Past Surgical History  Procedure Laterality Date  . Cholecystectomy    . Breast surgery      Current Outpatient Rx  Name  Route  Sig  Dispense  Refill  . doxycycline (ADOXA) 100 MG tablet   Oral   Take 1 tablet (100 mg total) by mouth 2 (two) times daily.   14 tablet   0   . etodolac (LODINE) 400 MG tablet   Oral   Take 1 tablet by mouth every 6 (six) hours as needed.         . fenofibrate micronized (LOFIBRA) 134 MG  capsule   Oral   Take 1 capsule by mouth daily.         . ferrous sulfate 324 (65 FE) MG TBEC   Oral   Take 1 tablet by mouth daily.         Marland Kitchen gabapentin (NEURONTIN) 400 MG capsule   Oral   Take 1 capsule by mouth 3 (three) times daily.         . hydrocortisone cream 1 %   Topical   Apply 1 application topically 2 (two) times daily.         Marland Kitchen levothyroxine (SYNTHROID, LEVOTHROID) 200 MCG tablet   Oral   Take 1 tablet by mouth daily before breakfast.         . lubiprostone (AMITIZA) 24 MCG capsule   Oral   Take 1 tablet by mouth daily.         Marland Kitchen neomycin-bacitracin-polymyxin (NEOSPORIN) ointment   Topical   Apply 1 application topically every 12 (twelve) hours.   15 g   1   . venlafaxine XR (EFFEXOR-XR) 75 MG 24 hr capsule   Oral   Take 1 capsule by mouth daily.           Allergies Aspirin; Dilantin ; Macrolides and ketolides; Phenobarbital; and Penicillins  No family history on file.  Social History Social History  Substance Use Topics  . Smoking status: Current Some Day Smoker  Types: Cigarettes  . Smokeless tobacco: None  . Alcohol Use: No    Review of Systems  Constitutional: Negative for fever. Eyes: Negative for visual changes. ENT: Negative for sore throat Cardiovascular: Negative for chest pain. Respiratory: Negative for shortness of breath. Gastrointestinal: Positive for nausea vomiting Genitourinary: Negative for dysuria. Musculoskeletal: Negative for back pain. Positive for joint pain Skin: Negative for rash. Neurological: Positive for intermittent headaches Psychiatric: Anxiety, denies suicidal ideation    ____________________________________________   PHYSICAL EXAM:  VITAL SIGNS: ED Triage Vitals  Enc Vitals Group     BP 02/04/15 1101 130/63 mmHg     Pulse Rate 02/04/15 1101 50     Resp 02/04/15 1101 20     Temp 02/04/15 1101 97.6 F (36.4 C)     Temp Source 02/04/15 1101 Oral     SpO2 02/04/15 1101 100 %      Weight 02/04/15 1101 160 lb (72.576 kg)     Height 02/04/15 1101 5\' 6"  (1.676 m)     Head Cir --      Peak Flow --      Pain Score 02/04/15 1102 10     Pain Loc --      Pain Edu? --      Excl. in [place]? --      Constitutional: Alert and oriented. Well appearing and in no distress. Eyes: Conjunctivae are normal.  ENT   Head: Normocephalic and atraumatic.   Mouth/Throat: Mucous membranes are moist. Cardiovascular: Normal rate, bradycardia rhythm. Normal and symmetric distal pulses are present in all extremities. No murmurs, rubs, or gallops. Respiratory: Normal respiratory effort without tachypnea nor retractions. Breath sounds are clear and equal bilaterally.  Gastrointestinal: Soft and non-tender in all quadrants. No distention. There is no CVA tenderness. Genitourinary: deferred Musculoskeletal: Nontender with normal range of motion in all extremities. No lower extremity tenderness nor edema. Neurologic:  Normal speech and language. No gross focal neurologic deficits are appreciated. Skin:  Skin is warm, dry and intact. No rash noted. Psychiatric: Mood and affect are normal. Patient exhibits appropriate insight and judgment.  ____________________________________________    LABS (pertinent positives/negatives)  Labs Reviewed  BASIC METABOLIC PANEL - Abnormal; Notable for the following:    Creatinine, Ser 1.44 (*)    GFR calc non Af Amer 40 (*)    GFR calc Af Amer 46 (*)    All other components within normal limits  CBC - Abnormal; Notable for the following:    Hemoglobin 11.3 (*)    MCV 72.6 (*)    MCH 22.9 (*)    MCHC 31.6 (*)    RDW 16.9 (*)    All other components within normal limits  LIPASE, BLOOD  URINALYSIS COMPLETEWITH MICROSCOPIC ([facility] ONLY)    ____________________________________________   EKG  ED ECG REPORT I, Lavonia Drafts, the attending physician, personally viewed and interpreted this ECG.   Date: 02/04/2015  EKG Time: 11:09 AM  Rate: 53   Rhythm: sinus bradycardia  Axis: Normal  Intervals:none  ST&T Change: Normal   ____________________________________________    RADIOLOGY I have personally reviewed any xrays that were ordered on this patient: None  ____________________________________________   PROCEDURES  Procedure(s) performed: none  Critical Care performed: none  ____________________________________________   INITIAL IMPRESSION / ASSESSMENT AND PLAN / ED COURSE  Pertinent labs & imaging results that were available during my care of the patient were reviewed by me and considered in my medical decision making (see chart for details).  Patient overall  well-appearing benign exam. I suspect large component of anxiety to today's presentation to the emergency department however she is having nausea and vomiting so we will send blood work and give Zofran and reevaluate  ----------------------------------------- 1:14 PM on 02/04/2015 -----------------------------------------  Lab work is unremarkable except for mildly elevated creatinine. Patient is no longer vomiting after Zofran. I suspect she has a gastritis I have asked her to follow up with her primary physician. Discussed return precautions as well  ____________________________________________   FINAL CLINICAL IMPRESSION(S) / ED DIAGNOSES  Final diagnoses:  Gastritis     Lavonia Drafts, MD 02/04/15 1324

## 2015-02-04 NOTE — Discharge Instructions (Signed)
Gastritis, Adult °Gastritis is soreness and puffiness (inflammation) of the lining of the stomach. If you do not get help, gastritis can cause bleeding and sores (ulcers) in the stomach. °HOME CARE  °· Only take medicine as told by your doctor. °· If you were given antibiotic medicines, take them as told. Finish the medicines even if you start to feel better. °· Drink enough fluids to keep your pee (urine) clear or pale yellow. °· Avoid foods and drinks that make your problems worse. Foods you may want to avoid include: °¨ Caffeine or alcohol. °¨ Chocolate. °¨ Mint. °¨ Garlic and onions. °¨ Spicy foods. °¨ Citrus fruits, including oranges, lemons, or limes. °¨ Food containing tomatoes, including sauce, chili, salsa, and pizza. °¨ Fried and fatty foods. °· Eat small meals throughout the day instead of large meals. °GET HELP RIGHT AWAY IF:  °· You have black or dark red poop (stools). °· You throw up (vomit) blood. It may look like coffee grounds. °· You cannot keep fluids down. °· Your belly (abdominal) pain gets worse. °· You have a fever. °· You do not feel better after 1 week. °· You have any other questions or concerns. °MAKE SURE YOU:  °· Understand these instructions. °· Will watch your condition. °· Will get help right away if you are not doing well or get worse. °Document Released: 10/20/2007 Document Revised: 07/26/2011 Document Reviewed: 06/16/2011 °[employer]® Patient Information ©2015 [employer]. This information is not intended to replace advice given to you by your health care provider. Make sure you discuss any questions you have with your health care provider. ° °

## 2015-02-06 ENCOUNTER — Emergency Department
Admission: EM | Admit: 2015-02-06 | Discharge: 2015-02-06 | Disposition: A | Discharge status: Home / Self Care | Payer: Medicaid Other | Attending: Emergency Medicine | Admitting: Emergency Medicine

## 2015-02-06 ENCOUNTER — Encounter: Payer: Self-pay | Admitting: *Deleted

## 2015-02-06 ENCOUNTER — Emergency Department: Payer: Medicaid Other

## 2015-02-06 DIAGNOSIS — G8929 Other chronic pain: Secondary | ICD-10-CM | POA: Diagnosis not present

## 2015-02-06 DIAGNOSIS — Z792 Long term (current) use of antibiotics: Secondary | ICD-10-CM | POA: Diagnosis not present

## 2015-02-06 DIAGNOSIS — J069 Acute upper respiratory infection, unspecified: Secondary | ICD-10-CM | POA: Diagnosis not present

## 2015-02-06 DIAGNOSIS — M545 Low back pain: Secondary | ICD-10-CM | POA: Insufficient documentation

## 2015-02-06 DIAGNOSIS — J45901 Unspecified asthma with (acute) exacerbation: Secondary | ICD-10-CM | POA: Diagnosis not present

## 2015-02-06 DIAGNOSIS — E039 Hypothyroidism, unspecified: Secondary | ICD-10-CM | POA: Insufficient documentation

## 2015-02-06 DIAGNOSIS — M25551 Pain in right hip: Secondary | ICD-10-CM | POA: Insufficient documentation

## 2015-02-06 DIAGNOSIS — Z720 Tobacco use: Secondary | ICD-10-CM | POA: Insufficient documentation

## 2015-02-06 DIAGNOSIS — Z79899 Other long term (current) drug therapy: Secondary | ICD-10-CM | POA: Insufficient documentation

## 2015-02-06 DIAGNOSIS — I129 Hypertensive chronic kidney disease with stage 1 through stage 4 chronic kidney disease, or unspecified chronic kidney disease: Secondary | ICD-10-CM | POA: Insufficient documentation

## 2015-02-06 DIAGNOSIS — J40 Bronchitis, not specified as acute or chronic: Secondary | ICD-10-CM

## 2015-02-06 DIAGNOSIS — R05 Cough: Secondary | ICD-10-CM | POA: Diagnosis present

## 2015-02-06 DIAGNOSIS — N182 Chronic kidney disease, stage 2 (mild): Secondary | ICD-10-CM | POA: Insufficient documentation

## 2015-02-06 DIAGNOSIS — Z880 Allergy status to penicillin: Secondary | ICD-10-CM | POA: Diagnosis not present

## 2015-02-06 DIAGNOSIS — M25552 Pain in left hip: Secondary | ICD-10-CM | POA: Insufficient documentation

## 2015-02-06 LAB — CBC WITH DIFFERENTIAL/PLATELET
BASOS ABS: 0.1 10*3/uL (ref 0–0.1)
Basophils Relative: 1 %
Eosinophils Absolute: 0.1 10*3/uL (ref 0–0.7)
Eosinophils Relative: 1 %
HEMATOCRIT: 37.5 % (ref 35.0–47.0)
HEMOGLOBIN: 11.9 g/dL — AB (ref 12.0–16.0)
LYMPHS PCT: 16 %
Lymphs Abs: 1.1 10*3/uL (ref 1.0–3.6)
MCH: 22.5 pg — ABNORMAL LOW (ref 26.0–34.0)
MCHC: 31.8 g/dL — ABNORMAL LOW (ref 32.0–36.0)
MCV: 70.8 fL — AB (ref 80.0–100.0)
Monocytes Absolute: 0.3 10*3/uL (ref 0.2–0.9)
Monocytes Relative: 5 %
NEUTROS ABS: 5.1 10*3/uL (ref 1.4–6.5)
NEUTROS PCT: 77 %
PLATELETS: 188 10*3/uL (ref 150–440)
RBC: 5.3 MIL/uL — AB (ref 3.80–5.20)
RDW: 16.5 % — ABNORMAL HIGH (ref 11.5–14.5)
WBC: 6.6 10*3/uL (ref 3.6–11.0)

## 2015-02-06 LAB — BASIC METABOLIC PANEL
ANION GAP: 7 (ref 5–15)
BUN: 14 mg/dL (ref 6–20)
CO2: 29 mmol/L (ref 22–32)
Calcium: 9.5 mg/dL (ref 8.9–10.3)
Chloride: 103 mmol/L (ref 101–111)
Creatinine, Ser: 1.37 mg/dL — ABNORMAL HIGH (ref 0.44–1.00)
GFR calc Af Amer: 49 mL/min — ABNORMAL LOW (ref >60)
GFR, EST NON AFRICAN AMERICAN: 42 mL/min — AB (ref >60)
Glucose, Bld: 89 mg/dL (ref 65–99)
POTASSIUM: 4.2 mmol/L (ref 3.5–5.1)
SODIUM: 139 mmol/L (ref 135–145)

## 2015-02-06 LAB — TSH: TSH: 63.837 u[IU]/mL — ABNORMAL HIGH (ref 0.350–4.500)

## 2015-02-06 LAB — T4, FREE: Free T4: <0.25 ng/dL — ABNORMAL LOW (ref 0.61–1.12)

## 2015-02-06 MED ORDER — LEVOTHYROXINE SODIUM 200 MCG PO TABS: 200.0000 ug | ORAL_TABLET | Freq: Every day | ORAL | Status: DC

## 2015-02-06 MED ORDER — IPRATROPIUM-ALBUTEROL 0.5-2.5 (3) MG/3ML IN SOLN
3.0000 mL | Freq: Once | RESPIRATORY_TRACT | Status: AC
  Administered 2015-02-06: 3 mL via RESPIRATORY_TRACT
  Filled 2015-02-06 (×2): qty 3

## 2015-02-06 MED ORDER — GUAIFENESIN ER 600 MG PO TB12
600.0000 mg | ORAL_TABLET | Freq: Two times a day (BID) | ORAL | Status: DC
  Administered 2015-02-06: 600 mg via ORAL
  Filled 2015-02-06 (×2): qty 1

## 2015-02-06 MED ORDER — ACETAMINOPHEN 325 MG PO TABS
650.0000 mg | ORAL_TABLET | Freq: Once | ORAL | Status: AC
  Administered 2015-02-06: 650 mg via ORAL
  Filled 2015-02-06: qty 2

## 2015-02-06 NOTE — ED Provider Notes (Addendum)
[facility] Emergency Department Provider Note   ____________________________________________  Time seen: On arrival I have reviewed the triage vital signs and the triage nursing note.  HISTORY  Chief Complaint Generalized Body Aches; Cough; and Headache   Historian Patient  HPI Helen Reid is a 54 y.o. female who has a history of hypothyroid, from bronchitic asthma, chronic pain, hypertension, who is presenting to the emergency department for upper respiratory congestionassociated with cough with yellow phlegm, wheezing, sinus congestion, nasal congestion, mild global headache, for approximately 3 days. Patient states she was seen in the emergency department 2 days ago with similar symptoms but her upper respiratory symptoms have become much more pronounced. At that time she is complaining more about epigastric discomfort. The patient also has chronic hip pain. The patient has not been taking her medications recently says she says that she is "sensitive" to it. She's asking for check of her thyroid functions that she's been off of her thyroid medication.    Past Medical History  Diagnosis Date  . Hypertension   . Anxiety   . Depression   . Cancer     lymphoma, gallbladder, breast    Patient Active Problem List   Diagnosis Date Noted  . CAFL (chronic airflow limitation) 10/17/2014  . Chronic kidney disease (CKD), stage II (mild) 10/17/2014  . Hypertriglyceridemia 10/17/2014  . Adult hypothyroidism 10/17/2014  . Insomnia, persistent 10/17/2014  . Anemia, iron deficiency 10/17/2014  . Irritable bowel syndrome with constipation 10/17/2014  . Major depressive disorder, recurrent, with catatonic features 10/17/2014  . AI (aortic incompetence) 10/17/2014  . Arthralgia of multiple joints 10/17/2014  . Cutaneous eruption 10/17/2014    Past Surgical History  Procedure Laterality Date  . Cholecystectomy    . Breast surgery      Current Outpatient Rx   Name  Route  Sig  Dispense  Refill  . doxycycline (ADOXA) 100 MG tablet   Oral   Take 1 tablet (100 mg total) by mouth 2 (two) times daily.   14 tablet   0   . etodolac (LODINE) 400 MG tablet   Oral   Take 1 tablet by mouth every 6 (six) hours as needed.         . fenofibrate micronized (LOFIBRA) 134 MG capsule   Oral   Take 1 capsule by mouth daily.         . ferrous sulfate 324 (65 FE) MG TBEC   Oral   Take 1 tablet by mouth daily.         Marland Kitchen gabapentin (NEURONTIN) 400 MG capsule   Oral   Take 1 capsule by mouth 3 (three) times daily.         . hydrocortisone cream 1 %   Topical   Apply 1 application topically 2 (two) times daily.         Marland Kitchen levothyroxine (SYNTHROID, LEVOTHROID) 200 MCG tablet   Oral   Take 1 tablet (200 mcg total) by mouth daily before breakfast.   30 tablet   0   . lubiprostone (AMITIZA) 24 MCG capsule   Oral   Take 1 tablet by mouth daily.         Marland Kitchen neomycin-bacitracin-polymyxin (NEOSPORIN) ointment   Topical   Apply 1 application topically every 12 (twelve) hours.   15 g   1   . ondansetron (ZOFRAN) 4 MG tablet   Oral   Take 1 tablet (4 mg total) by mouth daily as needed for nausea or  vomiting.   20 tablet   1   . venlafaxine XR (EFFEXOR-XR) 75 MG 24 hr capsule   Oral   Take 1 capsule by mouth daily.           Allergies Aspirin; Dilantin ; Macrolides and ketolides; Phenobarbital; and Penicillins  No family history on file.  Social History Social History  Substance Use Topics  . Smoking status: Current Some Day Smoker    Types: Cigarettes  . Smokeless tobacco: None  . Alcohol Use: No    Review of Systems  Constitutional: Negative for fever. Eyes: Negative for visual changes. ENT: Positive for sinus and nasal congestion.. Cardiovascular: Negative for chest pain. Respiratory: Positive for shortness of breath. Gastrointestinal: Negative for vomiting and diarrhea. Genitourinary: Negative for  dysuria. Musculoskeletal: Positive for chronic back pain, lower. Positive for chronic hip pain bilaterally. Skin: Negative for rash. Neurological: Positive for headache. 10 point Review of Systems otherwise negative ____________________________________________   PHYSICAL EXAM:  VITAL SIGNS: ED Triage Vitals  Enc Vitals Group     BP 02/06/15 1551 109/98 mmHg     Pulse --      Resp 02/06/15 1551 18     Temp 02/06/15 1551 98.3 F (36.8 C)     Temp Source 02/06/15 1551 Oral     SpO2 02/06/15 1551 99 %     Weight 02/06/15 1610 160 lb (72.576 kg)     Height 02/06/15 1551 5\' 8"  (1.727 m)     Head Cir --      Peak Flow --      Pain Score --      Pain Loc --      Pain Edu? --      Excl. in [place]? --      Constitutional: Alert and oriented. Well appearing and in no distress. Patient at times tearful. Eyes: Conjunctivae are normal. PERRL. Normal extraocular movements. ENT   Head: Normocephalic and atraumatic.   Nose: Moderate congestion and rhinorrhea which is clear.   Mouth/Throat: Mucous membranes are moist.   Neck: No stridor. Cardiovascular/Chest: Normal rate, regular rhythm.  No murmurs, rubs, or gallops. Respiratory: Normal respiratory effort without tachypnea or retractions. Moderate wheezing on an exhalation bilateral posteriorly. Gastrointestinal: Soft. No distention, no guarding, no rebound. Nontender   Genitourinary/rectal:Deferred Musculoskeletal: Normal range of motion in all extremities. No joint effusions.  No lower extremity tenderness.  No edema. Nontender tenderness to range of motion in both hips. Neurologic:  Normal speech and language. No gross or focal neurologic deficits are appreciated. Skin:  Skin is warm, dry and intact. No rash noted. Psychiatric: Patient does seem to have a bit of a labile mood, at times crying. However she is having no acute psychosis. She is denying depression, or any suicidal or homicidal  thoughts.  ____________________________________________   EKG I, Lisa Roca, MD, the attending physician have personally viewed and interpreted all ECGs.  77 bpm. Sinus rhythm with occasional PVC. Narrow QRS. Normal axis. Nonspecific ST-T wave. ____________________________________________  LABS (pertinent positives/negatives)  Basic metabolic panel significant for creatinine 1.37 White blood cell count 6.6 with no left shift. Hemoglobin 11.9, platelet count 188 TSH 63.837  ____________________________________________  RADIOLOGY All Xrays were viewed by me. Imaging interpreted by Radiologist.  Chest x-ray PA and lateral: No active disease in the chest __________________________________________  PROCEDURES  Procedure(s) performed: None  Critical Care performed: None  ____________________________________________   ED COURSE / ASSESSMENT AND PLAN  CONSULTATIONS: None  Pertinent labs & imaging results that  were available during my care of the patient were reviewed by me and considered in my medical decision making (see chart for details).   At this time the patient has clinically an upper respiratory infection which I suspect is viral. She has stable vital signs. DuoNeb did help with the wheezing, nontender prescribe her an albuterol inhaler. She's not having symptoms of myxedema coma, however she does appear to be hypothyroid based on her elevated TSH.  She is to start back her thyroid replacement, and a refill prescription of her prior dose was provided.. She is to follow with primary care doctor within 1 week.  Her chest x-ray is clear, and I do not suspect an acute bacterial infection.  Patient was also somewhat agitated that I did not take x-rays of her hips. Hip pain seems to be chronic, and there is no trauma. I do not suspect an acute bony abnormality and I do not see the utility in x-ray imaging today.   Patient / Family / Caregiver informed of clinical  course, medical decision-making process, and agree with plan.   I discussed return precautions, follow-up instructions, and discharged instructions with patient and/or family.  ___________________________________________   FINAL CLINICAL IMPRESSION(S) / ED DIAGNOSES   Final diagnoses:  Upper respiratory infection  Bronchitis  Hypothyroidism, unspecified hypothyroidism type       Lisa Roca, MD 02/06/15 1901  Lisa Roca, MD 02/06/15 7026

## 2015-02-06 NOTE — Discharge Instructions (Signed)
You were evaluated for body aches, upper respiratory and sinus congestion, coughing and wheezing, which I suspect is due to an upper respiratory virus/bronchitis. Take over-the-counter Tylenol and ibuprofen for muscle aches, over-the-counter Y Fenesin for cough, and you're being prescribed an albuterol inhaler for wheezing.  Drink plenty of fluids.  Return to the emergency room for any worsening condition including confusion or altered mental status, fever, trouble breathing or shortness of breath, chest pain, vomiting, or concern for dehydration such as not making urine or dry mouth. You need to follow up with a primary care provider, and you're referred to follow-up with the [facility], or your own primary care provider.   Upper Respiratory Infection, Adult An upper respiratory infection (URI) is also known as the common cold. It is often caused by a type of germ (virus). Colds are easily spread (contagious). You can pass it to others by kissing, coughing, sneezing, or drinking out of the same glass. Usually, you get better in 1 or 2 weeks.  HOME CARE   Only take medicine as told by your doctor.  Use a warm mist humidifier or breathe in steam from a hot shower.  Drink enough water and fluids to keep your pee (urine) clear or pale yellow.  Get plenty of rest.  Return to work when your temperature is back to normal or as told by your doctor. You may use a face mask and wash your hands to stop your cold from spreading. GET HELP RIGHT AWAY IF:   After the first few days, you feel you are getting worse.  You have questions about your medicine.  You have chills, shortness of breath, or brown or red spit (mucus).  You have yellow or brown snot (nasal discharge) or pain in the face, especially when you bend forward.  You have a fever, puffy (swollen) neck, pain when you swallow, or white spots in the back of your throat.  You have a bad headache, ear pain, sinus pain, or chest  pain.  You have a high-pitched whistling sound when you breathe in and out (wheezing).  You have a lasting cough or cough up blood.  You have sore muscles or a stiff neck. MAKE SURE YOU:   Understand these instructions.  Will watch your condition.  Will get help right away if you are not doing well or get worse. Document Released: 10/20/2007 Document Revised: 07/26/2011 Document Reviewed: 08/08/2013 [facility] Patient Information 2015 [place], [place]. This information is not intended to replace advice given to you by your health care provider. Make sure you discuss any questions you have with your health care provider.   Acute Bronchitis Bronchitis is inflammation of the airways that extend from the windpipe into the lungs (bronchi). The inflammation often causes mucus to develop. This leads to a cough, which is the most common symptom of bronchitis.  In acute bronchitis, the condition usually develops suddenly and goes away over time, usually in a couple weeks. Smoking, allergies, and asthma can make bronchitis worse. Repeated episodes of bronchitis may cause further lung problems.  CAUSES Acute bronchitis is most often caused by the same virus that causes a cold. The virus can spread from person to person (contagious) through coughing, sneezing, and touching contaminated objects. SIGNS AND SYMPTOMS   Cough.   Fever.   Coughing up mucus.   Body aches.   Chest congestion.   Chills.   Shortness of breath.   Sore throat.  DIAGNOSIS  Acute bronchitis is usually diagnosed through  a physical exam. Your health care provider will also ask you questions about your medical history. Tests, such as chest X-rays, are sometimes done to rule out other conditions.  TREATMENT  Acute bronchitis usually goes away in a couple weeks. Oftentimes, no medical treatment is necessary. Medicines are sometimes given for relief of fever or cough. Antibiotic medicines are usually not needed but  may be prescribed in certain situations. In some cases, an inhaler may be recommended to help reduce shortness of breath and control the cough. A cool mist vaporizer may also be used to help thin bronchial secretions and make it easier to clear the chest.  HOME CARE INSTRUCTIONS  Get plenty of rest.   Drink enough fluids to keep your urine clear or pale yellow (unless you have a medical condition that requires fluid restriction). Increasing fluids may help thin your respiratory secretions (sputum) and reduce chest congestion, and it will prevent dehydration.   Take medicines only as directed by your health care provider.  If you were prescribed an antibiotic medicine, finish it all even if you start to feel better.  Avoid smoking and secondhand smoke. Exposure to cigarette smoke or irritating chemicals will make bronchitis worse. If you are a smoker, consider using nicotine gum or skin patches to help control withdrawal symptoms. Quitting smoking will help your lungs heal faster.   Reduce the chances of another bout of acute bronchitis by washing your hands frequently, avoiding people with cold symptoms, and trying not to touch your hands to your mouth, nose, or eyes.   Keep all follow-up visits as directed by your health care provider.  SEEK MEDICAL CARE IF: Your symptoms do not improve after 1 week of treatment.  SEEK IMMEDIATE MEDICAL CARE IF:  You develop an increased fever or chills.   You have chest pain.   You have severe shortness of breath.  You have bloody sputum.   You develop dehydration.  You faint or repeatedly feel like you are going to pass out.  You develop repeated vomiting.  You develop a severe headache. MAKE SURE YOU:   Understand these instructions.  Will watch your condition.  Will get help right away if you are not doing well or get worse. Document Released: 06/10/2004 Document Revised: 09/17/2013 Document Reviewed: 10/24/2012 [facility]  Patient Information 2015 [place], [place]. This information is not intended to replace advice given to you by your health care provider. Make sure you discuss any questions you have with your health care provider.

## 2015-02-06 NOTE — ED Notes (Signed)
Pt has called for her ride home and has asked to stay in exam room until her ride arrives to ED.

## 2015-02-06 NOTE — ED Notes (Signed)
Pt seen this week for same, diagnosed with gastritis. States continues to have generalized body aches with cough, congestion, headache.

## 2015-02-10 ENCOUNTER — Telehealth: Payer: Self-pay

## 2015-02-10 NOTE — Telephone Encounter (Signed)
Tried to contact [place] from [employer] but she was not in, so I spoke with [place]. She was asking for a current medication list for this patient. The requested information was printed and faxed to [phone].

## 2015-02-24 ENCOUNTER — Telehealth: Payer: Self-pay

## 2015-02-24 NOTE — Telephone Encounter (Signed)
Patient called stating she was returning our call. I informed her that i looked into her filed and I could not see where anyone from our office tried to reach her and that it was probably the automated system calling to confirm her appt on Thursday. She said ok.

## 2015-02-27 ENCOUNTER — Ambulatory Visit (INDEPENDENT_AMBULATORY_CARE_PROVIDER_SITE_OTHER): Payer: Medicaid Other | Admitting: Family Medicine

## 2015-02-27 ENCOUNTER — Telehealth: Payer: Self-pay | Admitting: Family Medicine

## 2015-02-27 ENCOUNTER — Encounter: Payer: Self-pay | Admitting: Family Medicine

## 2015-02-27 VITALS — BP 112/88 | HR 62 | Temp 98.2°F | Resp 14 | Wt 151.1 lb

## 2015-02-27 DIAGNOSIS — R21 Rash and other nonspecific skin eruption: Secondary | ICD-10-CM

## 2015-02-27 DIAGNOSIS — E038 Other specified hypothyroidism: Secondary | ICD-10-CM

## 2015-02-27 DIAGNOSIS — F3342 Major depressive disorder, recurrent, in full remission: Secondary | ICD-10-CM

## 2015-02-27 DIAGNOSIS — E034 Atrophy of thyroid (acquired): Secondary | ICD-10-CM | POA: Diagnosis not present

## 2015-02-27 MED ORDER — LEVOTHYROXINE SODIUM 200 MCG PO TABS: 200.0000 ug | ORAL_TABLET | Freq: Every day | ORAL | Status: DC

## 2015-02-27 NOTE — Telephone Encounter (Signed)
Pt would like a call back

## 2015-02-27 NOTE — Progress Notes (Signed)
Name: Helen Reid   MRN: [id number]    DOB: 1961/01/19   Date:02/27/2015       Progress Note  Subjective  Chief Complaint  Chief Complaint  Patient presents with  . Follow-up    patient is here for an ER follow-up   . Medication Refill    patient is not taking any medication but the levothyroxine.   . Skin Problem    patient has a questionable area in the center of her back that is painful.    HPI  ER follow up, diagnosed with having URI. Was out of her thyroid medication at the time but now is back on it. Feeling better. Has recurrent skin lesions which has subsided in frequency, just has one lesion on her back she wants me to look at. Did see dermatologist, was allergic reaction, maybe to her medications. She has self discontinued all medications except thyroid. Requesting refill today. Requesting letter stating that she is capable of managing her finances. Denies manic episodes, illegal or illicit drug use.  Past Medical History  Diagnosis Date  . Hypertension   . Anxiety   . Depression   . Cancer [facility])     lymphoma, gallbladder, breast    Patient Active Problem List   Diagnosis Date Noted  . CAFL (chronic airflow limitation) ([place]) 10/17/2014  . Chronic kidney disease (CKD), stage II (mild) 10/17/2014  . Hypertriglyceridemia 10/17/2014  . Adult hypothyroidism 10/17/2014  . Insomnia, persistent 10/17/2014  . Anemia, iron deficiency 10/17/2014  . Irritable bowel syndrome with constipation 10/17/2014  . Major depressive disorder, recurrent, with catatonic features ([place]) 10/17/2014  . AI (aortic incompetence) 10/17/2014  . Arthralgia of multiple joints 10/17/2014  . Cutaneous eruption 10/17/2014    Social History  Substance Use Topics  . Smoking status: Current Some Day Smoker    Types: Cigarettes  . Smokeless tobacco: Not on file  . Alcohol Use: No     Current outpatient prescriptions:  .  levothyroxine (SYNTHROID, LEVOTHROID) 200 MCG tablet, Take 1 tablet (200  mcg total) by mouth daily before breakfast., Disp: 30 tablet, Rfl: 0 .  etodolac (LODINE) 400 MG tablet, Take 1 tablet by mouth every 6 (six) hours as needed., Disp: , Rfl:  .  fenofibrate micronized (LOFIBRA) 134 MG capsule, Take 1 capsule by mouth daily., Disp: , Rfl:  .  ferrous sulfate 324 (65 FE) MG TBEC, Take 1 tablet by mouth daily., Disp: , Rfl:  .  gabapentin (NEURONTIN) 400 MG capsule, Take 1 capsule by mouth 3 (three) times daily., Disp: , Rfl:  .  hydrocortisone cream 1 %, Apply 1 application topically 2 (two) times daily., Disp: , Rfl:  .  lubiprostone (AMITIZA) 24 MCG capsule, Take 1 tablet by mouth daily., Disp: , Rfl:  .  neomycin-bacitracin-polymyxin (NEOSPORIN) ointment, Apply 1 application topically every 12 (twelve) hours. (Patient not taking: Reported on 02/27/2015), Disp: 15 g, Rfl: 1 .  ondansetron (ZOFRAN) 4 MG tablet, Take 1 tablet (4 mg total) by mouth daily as needed for nausea or vomiting. (Patient not taking: Reported on 02/27/2015), Disp: 20 tablet, Rfl: 1 .  venlafaxine XR (EFFEXOR-XR) 75 MG 24 hr capsule, Take 1 capsule by mouth daily., Disp: , Rfl:   Allergies  Allergen Reactions  . Aspirin Swelling  . Dilantin  [Phenytoin] Nausea And Vomiting  . Macrolides And Ketolides Swelling  . Phenobarbital Nausea And Vomiting  . Penicillins Rash    Review of Systems  Positive for nearly resolved rash as mentioned  in HPI, otherwise all systems reviewed and are negative.  Objective  BP 112/88 mmHg  Pulse 62  Temp(Src) 98.2 F (36.8 C) (Oral)  Resp 14  Wt 151 lb 1.6 oz (68.539 kg)  SpO2 98%  Body mass index is 22.98 kg/(m^2).   Physical Exam  Constitutional: Patient appears well-developed and well-nourished, well dressed. In no distress.   Cardiovascular: Normal rate, regular rhythm and normal heart sounds.  No murmur heard.  Pulmonary/Chest: Effort normal and breath sounds normal. No respiratory distress. Musculoskeletal: Normal range of motion bilateral  UE and LE, no joint effusions. Peripheral vascular: Bilateral LE no edema. Neurological: CN II-XII grossly intact with no focal deficits. Alert and oriented to person, place, and time. Coordination, balance, strength, speech and gait are normal.  Skin: Skin is warm and dry. Well healed flesh colored 8mm lesion on right midline back with good granulation tissue, no erythema or discharge. Psychiatric: Patient has a stable mood and affect. Behavior is normal in office today. Judgment and thought content normal in office today.    Assessment & Plan  1. Hypothyroidism due to acquired atrophy of thyroid TSH in 01/2015 high, but she wasn't taking her meds at the time.  - levothyroxine (SYNTHROID, LEVOTHROID) 200 MCG tablet; Take 1 tablet (200 mcg total) by mouth daily before breakfast.  Dispense: 90 tablet; Refill: 1  2. Cutaneous eruption Healing.   3. Major depressive disorder, recurrent, in full remission ([place]) Stable.

## 2015-02-27 NOTE — Telephone Encounter (Signed)
Tried to contact this patient to see how we can be of assistance to her but there was no answer. I was not able to leave a message due to there was no voicemail.

## 2015-05-01 ENCOUNTER — Emergency Department: Payer: Medicaid Other

## 2015-05-01 ENCOUNTER — Telehealth: Payer: Self-pay

## 2015-05-01 ENCOUNTER — Encounter: Payer: Self-pay | Admitting: Emergency Medicine

## 2015-05-01 ENCOUNTER — Emergency Department
Admission: EM | Admit: 2015-05-01 | Discharge: 2015-05-01 | Disposition: A | Discharge status: Home / Self Care | Payer: Medicaid Other | Attending: Emergency Medicine | Admitting: Emergency Medicine

## 2015-05-01 DIAGNOSIS — N182 Chronic kidney disease, stage 2 (mild): Secondary | ICD-10-CM | POA: Diagnosis not present

## 2015-05-01 DIAGNOSIS — F17210 Nicotine dependence, cigarettes, uncomplicated: Secondary | ICD-10-CM | POA: Diagnosis not present

## 2015-05-01 DIAGNOSIS — Z79899 Other long term (current) drug therapy: Secondary | ICD-10-CM | POA: Diagnosis not present

## 2015-05-01 DIAGNOSIS — Z880 Allergy status to penicillin: Secondary | ICD-10-CM | POA: Diagnosis not present

## 2015-05-01 DIAGNOSIS — I129 Hypertensive chronic kidney disease with stage 1 through stage 4 chronic kidney disease, or unspecified chronic kidney disease: Secondary | ICD-10-CM | POA: Insufficient documentation

## 2015-05-01 DIAGNOSIS — M25561 Pain in right knee: Secondary | ICD-10-CM | POA: Diagnosis not present

## 2015-05-01 MED ORDER — KETOROLAC TROMETHAMINE 60 MG/2ML IM SOLN
60.0000 mg | Freq: Once | INTRAMUSCULAR | Status: AC
  Administered 2015-05-01: 60 mg via INTRAMUSCULAR
  Filled 2015-05-01: qty 2

## 2015-05-01 MED ORDER — HYDROCODONE-ACETAMINOPHEN 5-325 MG PO TABS
2.0000 | ORAL_TABLET | Freq: Once | ORAL | Status: AC
Start: 2015-05-01 — End: 2015-05-01
  Administered 2015-05-01: 2 via ORAL
  Filled 2015-05-01: qty 2

## 2015-05-01 MED ORDER — OXYCODONE-ACETAMINOPHEN 5-325 MG PO TABS: 1.0000 | ORAL_TABLET | ORAL | Status: DC | PRN

## 2015-05-01 NOTE — ED Provider Notes (Signed)
[facility] Emergency Department Provider Note  ____________________________________________  Time seen: Approximately 11:02 AM  I have reviewed the triage vital signs and the nursing notes.   HISTORY  Chief Complaint Knee Pain    HPI Griffin Takata is a 54 y.o. female presents for evaluation of right knee pain for years. Patient states that she desires referral to orthopedic/pain management clinic. Currently taking over-the-counter medications with no relief.   Past Medical History  Diagnosis Date  . Hypertension   . Anxiety   . Depression   . Cancer [facility])     lymphoma, gallbladder, breast    Patient Active Problem List   Diagnosis Date Noted  . CAFL (chronic airflow limitation) ([place]) 10/17/2014  . Chronic kidney disease (CKD), stage II (mild) 10/17/2014  . Hypertriglyceridemia 10/17/2014  . Adult hypothyroidism 10/17/2014  . Insomnia, persistent 10/17/2014  . Anemia, iron deficiency 10/17/2014  . Irritable bowel syndrome with constipation 10/17/2014  . Major depressive disorder, recurrent, with catatonic features ([place]) 10/17/2014  . AI (aortic incompetence) 10/17/2014  . Arthralgia of multiple joints 10/17/2014  . Cutaneous eruption 10/17/2014    Past Surgical History  Procedure Laterality Date  . Cholecystectomy    . Breast surgery      Current Outpatient Rx  Name  Route  Sig  Dispense  Refill  . hydrocortisone cream 1 %   Topical   Apply 1 application topically 2 (two) times daily.         Marland Kitchen levothyroxine (SYNTHROID, LEVOTHROID) 200 MCG tablet   Oral   Take 1 tablet (200 mcg total) by mouth daily before breakfast.   90 tablet   1     Patient would like medication mailed to her home.   . neomycin-bacitracin-polymyxin (NEOSPORIN) ointment   Topical   Apply 1 application topically every 12 (twelve) hours. Patient not taking: Reported on 02/27/2015   15 g   1     Allergies Aspirin; Dilantin ; Macrolides and ketolides;  Phenobarbital; and Penicillins  No family history on file.  Social History Social History  Substance Use Topics  . Smoking status: Current Some Day Smoker    Types: Cigarettes  . Smokeless tobacco: None  . Alcohol Use: No    Review of Systems Constitutional: No fever/chills Eyes: No visual changes. ENT: No sore throat. Cardiovascular: Denies chest pain. Respiratory: Denies shortness of breath. Gastrointestinal: No abdominal pain.  No nausea, no vomiting.  No diarrhea.  No constipation. Genitourinary: Negative for dysuria. Musculoskeletal: Positive for pain to the right knee. Skin: Negative for rash. Neurological: Negative for headaches, focal weakness or numbness.  10-point ROS otherwise negative.  ____________________________________________   PHYSICAL EXAM:  VITAL SIGNS: ED Triage Vitals  Enc Vitals Group     BP 05/01/15 1056 119/63 mmHg     Pulse Rate 05/01/15 1056 53     Resp 05/01/15 1056 16     Temp 05/01/15 1056 97.5 F (36.4 C)     Temp Source 05/01/15 1056 Oral     SpO2 05/01/15 1056 100 %     Weight --      Height 05/01/15 1056 5\' 5"  (1.651 m)     Head Cir --      Peak Flow --      Pain Score 05/01/15 1057 10     Pain Loc --      Pain Edu? --      Excl. in [place]? --     Constitutional: Alert and oriented. Well appearing  and in no acute distress. Cardiovascular: Normal rate, regular rhythm. Grossly normal heart sounds.  Good peripheral circulation. Respiratory: Normal respiratory effort.  No retractions. Lungs CTAB. Musculoskeletal: No lower extremity tenderness nor edema.  No joint effusions. Neurologic:  Normal speech and language. No gross focal neurologic deficits are appreciated. No gait instability. Skin:  Skin is warm, dry and intact. No rash noted. Psychiatric: Mood and affect are normal. Speech and behavior are normal.  ____________________________________________   LABS (all labs ordered are listed, but only abnormal results are  displayed)  Labs Reviewed - No data to display ____________________________________________  RADIOLOGY  FINDINGS: There is no evidence of fracture, dislocation, or joint effusion. There is no evidence of arthropathy or other focal bone abnormality. Soft tissues are unremarkable.  IMPRESSION: No acute abnormality noted. No change from the prior exam. ____________________________________________   PROCEDURES  Procedure(s) performed: None  Critical Care performed: No  ____________________________________________   INITIAL IMPRESSION / ASSESSMENT AND PLAN / ED COURSE  Pertinent labs & imaging results that were available during my care of the patient were reviewed by me and considered in my medical decision making (see chart for details).  Review of [place] controlled substance registry reviews no prior prescriptions. Rx given for Percocet 5/25 #15 patient follow-up PCP orthopedics or pain management as requested. ____________________________________________   FINAL CLINICAL IMPRESSION(S) / ED DIAGNOSES  Final diagnoses:  Knee pain, acute, right      Arlyss Repress, PA-C 05/01/15 1220  Carrie Mew, MD 05/01/15 [phone]

## 2015-05-01 NOTE — Telephone Encounter (Signed)
Pharmacist Marisue Ivan) called stating that this patient came through the drive through window wanting to know why she has not gotten her Levothyroxine. She demanded to speak to a pharmacist and when [place] went outside to speak to her, she left. Izora Gala informed me that they have tried to deliver her meds on several occasions but there was no answer at the door and no one picked up the phone.   Izora Gala, stated that she wanted to let us know of her actions and that she has not been taking her medications as she should. She stated that her medication will not be filled until she has actually requested all of her meds and after she speaks directly to her.   I thanked her for informing us of this and stated that I will pass this information along to Dr. Nadine Counts.

## 2015-05-01 NOTE — Discharge Instructions (Signed)
Heat Therapy Heat therapy can help ease sore, stiff, injured, and tight muscles and joints. Heat relaxes your muscles, which may help ease your pain.  RISKS AND COMPLICATIONS If you have any of the following conditions, do not use heat therapy unless your health care provider has approved:  Poor circulation.  Healing wounds or scarred skin in the area being treated.  Diabetes, heart disease, or high blood pressure.  Not being able to feel (numbness) the area being treated.  Unusual swelling of the area being treated.  Active infections.  Blood clots.  Cancer.  Inability to communicate pain. This may include young children and people who have problems with their brain function (dementia).  Pregnancy. Heat therapy should only be used on old, pre-existing, or long-lasting (chronic) injuries. Do not use heat therapy on new injuries unless directed by your health care provider. HOW TO USE HEAT THERAPY There are several different kinds of heat therapy, including:  Moist heat pack.  Warm water bath.  Hot water bottle.  Electric heating pad.  Heated gel pack.  Heated wrap.  Electric heating pad. Use the heat therapy method suggested by your health care provider. Follow your health care provider's instructions on when and how to use heat therapy. GENERAL HEAT THERAPY RECOMMENDATIONS  Do not sleep while using heat therapy. Only use heat therapy while you are awake.  Your skin may turn pink while using heat therapy. Do not use heat therapy if your skin turns red.  Do not use heat therapy if you have new pain.  High heat or long exposure to heat can cause burns. Be careful when using heat therapy to avoid burning your skin.  Do not use heat therapy on areas of your skin that are already irritated, such as with a rash or sunburn. SEEK MEDICAL CARE IF:  You have blisters, redness, swelling, or numbness.  You have new pain.  Your pain is worse. MAKE SURE  YOU:  Understand these instructions.  Will watch your condition.  Will get help right away if you are not doing well or get worse.   This information is not intended to replace advice given to you by your health care provider. Make sure you discuss any questions you have with your health care provider.   Document Released: 07/26/2011 Document Revised: 05/24/2014 Document Reviewed: 06/26/2013 Elsevier Interactive Patient Education 2016 [place].  Cryotherapy Cryotherapy means treatment with cold. Ice or gel packs can be used to reduce both pain and swelling. Ice is the most helpful within the first 24 to 48 hours after an injury or flare-up from overusing a muscle or joint. Sprains, strains, spasms, burning pain, shooting pain, and aches can all be eased with ice. Ice can also be used when recovering from surgery. Ice is effective, has very few side effects, and is safe for most people to use. PRECAUTIONS  Ice is not a safe treatment option for people with:  Raynaud phenomenon. This is a condition affecting small blood vessels in the extremities. Exposure to cold may cause your problems to return.  Cold hypersensitivity. There are many forms of cold hypersensitivity, including:  Cold urticaria. Red, itchy hives appear on the skin when the tissues begin to warm after being iced.  Cold erythema. This is a red, itchy rash caused by exposure to cold.  Cold hemoglobinuria. Red blood cells break down when the tissues begin to warm after being iced. The hemoglobin that carry oxygen are passed into the urine because they cannot  combine with blood proteins fast enough.  Numbness or altered sensitivity in the area being iced. If you have any of the following conditions, do not use ice until you have discussed cryotherapy with your caregiver:  Heart conditions, such as arrhythmia, angina, or chronic heart disease.  High blood pressure.  Healing wounds or open skin in the area being  iced.  Current infections.  Rheumatoid arthritis.  Poor circulation.  Diabetes. Ice slows the blood flow in the region it is applied. This is beneficial when trying to stop inflamed tissues from spreading irritating chemicals to surrounding tissues. However, if you expose your skin to cold temperatures for too long or without the proper protection, you can damage your skin or nerves. Watch for signs of skin damage due to cold. HOME CARE INSTRUCTIONS Follow these tips to use ice and cold packs safely.  Place a dry or damp towel between the ice and skin. A damp towel will cool the skin more quickly, so you may need to shorten the time that the ice is used.  For a more rapid response, add gentle compression to the ice.  Ice for no more than 10 to 20 minutes at a time. The bonier the area you are icing, the less time it will take to get the benefits of ice.  Check your skin after 5 minutes to make sure there are no signs of a poor response to cold or skin damage.  Rest 20 minutes or more between uses.  Once your skin is numb, you can end your treatment. You can test numbness by very lightly touching your skin. The touch should be so light that you do not see the skin dimple from the pressure of your fingertip. When using ice, most people will feel these normal sensations in this order: cold, burning, aching, and numbness.  Do not use ice on someone who cannot communicate their responses to pain, such as small children or people with dementia. HOW TO MAKE AN ICE PACK Ice packs are the most common way to use ice therapy. Other methods include ice massage, ice baths, and cryosprays. Muscle creams that cause a cold, tingly feeling do not offer the same benefits that ice offers and should not be used as a substitute unless recommended by your caregiver. To make an ice pack, do one of the following:  Place crushed ice or a bag of frozen vegetables in a sealable plastic bag. Squeeze out the excess  air. Place this bag inside another plastic bag. Slide the bag into a pillowcase or place a damp towel between your skin and the bag.  Mix 3 parts water with 1 part rubbing alcohol. Freeze the mixture in a sealable plastic bag. When you remove the mixture from the freezer, it will be slushy. Squeeze out the excess air. Place this bag inside another plastic bag. Slide the bag into a pillowcase or place a damp towel between your skin and the bag. SEEK MEDICAL CARE IF:  You develop white spots on your skin. This may give the skin a blotchy (mottled) appearance.  Your skin turns blue or pale.  Your skin becomes waxy or hard.  Your swelling gets worse. MAKE SURE YOU:   Understand these instructions.  Will watch your condition.  Will get help right away if you are not doing well or get worse.   This information is not intended to replace advice given to you by your health care provider. Make sure you discuss any questions you  have with your health care provider.   Document Released: 12/28/2010 Document Revised: 05/24/2014 Document Reviewed: 12/28/2010 Elsevier Interactive Patient Education 2016 [employer]  Knee Pain Knee pain is a very common symptom and can have many causes. Knee pain often goes away when you follow your health care provider's instructions for relieving pain and discomfort at home. However, knee pain can develop into a condition that needs treatment. Some conditions may include:  Arthritis caused by wear and tear (osteoarthritis).  Arthritis caused by swelling and irritation (rheumatoid arthritis or gout).  A cyst or growth in your knee.  An infection in your knee joint.  An injury that will not heal.  Damage, swelling, or irritation of the tissues that support your knee (torn ligaments or tendinitis). If your knee pain continues, additional tests may be ordered to diagnose your condition. Tests may include X-rays or other imaging studies of your knee. You may  also need to have fluid removed from your knee. Treatment for ongoing knee pain depends on the cause, but treatment may include:  Medicines to relieve pain or swelling.  Steroid injections in your knee.  Physical therapy.  Surgery. HOME CARE INSTRUCTIONS  Take medicines only as directed by your health care provider.  Rest your knee and keep it raised (elevated) while you are resting.  Do not do things that cause or worsen pain.  Avoid high-impact activities or exercises, such as running, jumping rope, or doing jumping jacks.  Apply ice to the knee area:  Put ice in a plastic bag.  Place a towel between your skin and the bag.  Leave the ice on for 20 minutes, 2-3 times a day.  Ask your health care provider if you should wear an elastic knee support.  Keep a pillow under your knee when you sleep.  Lose weight if you are overweight. Extra weight can put pressure on your knee.  Do not use any tobacco products, including cigarettes, chewing tobacco, or electronic cigarettes. If you need help quitting, ask your health care provider. Smoking may slow the healing of any bone and joint problems that you may have. SEEK MEDICAL CARE IF:  Your knee pain continues, changes, or gets worse.  You have a fever along with knee pain.  Your knee buckles or locks up.  Your knee becomes more swollen. SEEK IMMEDIATE MEDICAL CARE IF:   Your knee joint feels hot to the touch.  You have chest pain or trouble breathing.   This information is not intended to replace advice given to you by your health care provider. Make sure you discuss any questions you have with your health care provider.   Document Released: 02/28/2007 Document Revised: 05/24/2014 Document Reviewed: 12/17/2013 Elsevier Interactive Patient Education [employer].

## 2015-05-01 NOTE — ED Notes (Signed)
Pt presents with right knee pain for years. Wants to be referred to pain clinic.

## 2015-05-05 NOTE — Telephone Encounter (Signed)
Noted  

## 2015-05-21 ENCOUNTER — Emergency Department
Admission: EM | Admit: 2015-05-21 | Discharge: 2015-05-23 | Disposition: A | Discharge status: Other Acute Inpatient Hospital | Payer: Medicaid Other | Attending: Emergency Medicine | Admitting: Emergency Medicine

## 2015-05-21 ENCOUNTER — Encounter: Payer: Self-pay | Admitting: Emergency Medicine

## 2015-05-21 DIAGNOSIS — Z880 Allergy status to penicillin: Secondary | ICD-10-CM | POA: Diagnosis not present

## 2015-05-21 DIAGNOSIS — F17210 Nicotine dependence, cigarettes, uncomplicated: Secondary | ICD-10-CM | POA: Insufficient documentation

## 2015-05-21 DIAGNOSIS — I129 Hypertensive chronic kidney disease with stage 1 through stage 4 chronic kidney disease, or unspecified chronic kidney disease: Secondary | ICD-10-CM | POA: Insufficient documentation

## 2015-05-21 DIAGNOSIS — R451 Restlessness and agitation: Secondary | ICD-10-CM | POA: Insufficient documentation

## 2015-05-21 DIAGNOSIS — R45851 Suicidal ideations: Secondary | ICD-10-CM | POA: Diagnosis present

## 2015-05-21 DIAGNOSIS — Z79899 Other long term (current) drug therapy: Secondary | ICD-10-CM | POA: Diagnosis not present

## 2015-05-21 DIAGNOSIS — N182 Chronic kidney disease, stage 2 (mild): Secondary | ICD-10-CM | POA: Insufficient documentation

## 2015-05-21 DIAGNOSIS — Z792 Long term (current) use of antibiotics: Secondary | ICD-10-CM | POA: Diagnosis not present

## 2015-05-21 DIAGNOSIS — F1210 Cannabis abuse, uncomplicated: Secondary | ICD-10-CM | POA: Diagnosis not present

## 2015-05-21 DIAGNOSIS — F3160 Bipolar disorder, current episode mixed, unspecified: Secondary | ICD-10-CM | POA: Diagnosis not present

## 2015-05-21 DIAGNOSIS — F1410 Cocaine abuse, uncomplicated: Secondary | ICD-10-CM | POA: Diagnosis not present

## 2015-05-21 DIAGNOSIS — R400 Somnolence: Secondary | ICD-10-CM | POA: Diagnosis not present

## 2015-05-21 LAB — COMPREHENSIVE METABOLIC PANEL
ALBUMIN: 4.4 g/dL (ref 3.5–5.0)
ALK PHOS: 44 U/L (ref 38–126)
ALT: 16 U/L (ref 14–54)
ANION GAP: 4 — AB (ref 5–15)
AST: 23 U/L (ref 15–41)
BILIRUBIN TOTAL: 1.4 mg/dL — AB (ref 0.3–1.2)
BUN: 13 mg/dL (ref 6–20)
CALCIUM: 9.4 mg/dL (ref 8.9–10.3)
CO2: 30 mmol/L (ref 22–32)
Chloride: 105 mmol/L (ref 101–111)
Creatinine, Ser: 1.38 mg/dL — ABNORMAL HIGH (ref 0.44–1.00)
GFR, EST AFRICAN AMERICAN: 49 mL/min — AB (ref >60)
GFR, EST NON AFRICAN AMERICAN: 42 mL/min — AB (ref >60)
Glucose, Bld: 95 mg/dL (ref 65–99)
POTASSIUM: 3.4 mmol/L — AB (ref 3.5–5.1)
Sodium: 139 mmol/L (ref 135–145)
TOTAL PROTEIN: 7.5 g/dL (ref 6.5–8.1)

## 2015-05-21 LAB — CBC
HEMATOCRIT: 39.7 % (ref 35.0–47.0)
HEMOGLOBIN: 12.6 g/dL (ref 12.0–16.0)
MCH: 22.9 pg — ABNORMAL LOW (ref 26.0–34.0)
MCHC: 31.8 g/dL — ABNORMAL LOW (ref 32.0–36.0)
MCV: 71.8 fL — AB (ref 80.0–100.0)
Platelets: 186 10*3/uL (ref 150–440)
RBC: 5.52 MIL/uL — AB (ref 3.80–5.20)
RDW: 13.4 % (ref 11.5–14.5)
WBC: 5.7 10*3/uL (ref 3.6–11.0)

## 2015-05-21 LAB — SALICYLATE LEVEL: Salicylate Lvl: <4 mg/dL (ref 2.8–30.0)

## 2015-05-21 LAB — ETHANOL

## 2015-05-21 LAB — ACETAMINOPHEN LEVEL

## 2015-05-21 MED ORDER — LORAZEPAM 2 MG/ML IJ SOLN: 1.0000 mg | Freq: Once | INTRAMUSCULAR | Status: DC

## 2015-05-21 MED ORDER — LORAZEPAM 2 MG/ML IJ SOLN
INTRAMUSCULAR | Status: AC
  Filled 2015-05-21: qty 1

## 2015-05-21 MED ORDER — LORAZEPAM 2 MG/ML IJ SOLN
2.0000 mg | Freq: Once | INTRAMUSCULAR | Status: AC
  Administered 2015-05-21: 2 mg via INTRAMUSCULAR

## 2015-05-21 MED ORDER — HALOPERIDOL LACTATE 5 MG/ML IJ SOLN
INTRAMUSCULAR | Status: AC
  Filled 2015-05-21: qty 1

## 2015-05-21 NOTE — ED Notes (Signed)
CSW Jaslynn at bedside at this time

## 2015-05-21 NOTE — ED Notes (Signed)
Pt here with mobile crisis rep today presenting with thoughts of SI. And she wants to be checked for TB, has had a cough with some blood in her sputum.  Pt here voluntary.

## 2015-05-21 NOTE — ED Notes (Addendum)
Pt moved to room 23. Pt very upset and crying. Pt deescalated and changed into behavioral scrubs. Pt refusing to take jewelry out until her sister arrives. Pt still with personal socks on and jewelry. Pt agreed to IM ativan to calm down.
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
## 2015-05-21 NOTE — ED Provider Notes (Addendum)
[facility] Emergency Department Provider Note  Time seen: 2:50 PM  I have reviewed the triage vital signs and the nursing notes.   HISTORY  Chief Complaint Suicidal    HPI Helen Reid is a 55 y.o. female with a past medical history of hypertension, anxiety, depression, who presents the emergency department for suicidal ideation. Patient presented to the waiting room of the emergency department stating that she is going to kill herself by overdosing and jumping into traffic. Patient extremely disruptive, and then attempted to run out of the emergency department. Patient was restrained and immediately involuntarily committed by my colleague Dr. Edd Fabian. Patient brought to the emergency department, patient is extremely agitated cursing, being combative. Patient received IM sedation upon arrival.     Past Medical History  Diagnosis Date  . Hypertension   . Anxiety   . Depression   . Cancer [facility])     lymphoma, gallbladder, breast    Patient Active Problem List   Diagnosis Date Noted  . CAFL (chronic airflow limitation) ([place]) 10/17/2014  . Chronic kidney disease (CKD), stage II (mild) 10/17/2014  . Hypertriglyceridemia 10/17/2014  . Adult hypothyroidism 10/17/2014  . Insomnia, persistent 10/17/2014  . Anemia, iron deficiency 10/17/2014  . Irritable bowel syndrome with constipation 10/17/2014  . 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 AI (aortic incompetence) 10/17/2014  . Arthralgia of multiple joints 10/17/2014  . Cutaneous eruption 10/17/2014    Past Surgical History  Procedure Laterality Date  . Cholecystectomy    . Breast surgery      Current Outpatient Rx  Name  Route  Sig  Dispense  Refill  . hydrocortisone cream 1 %   Topical   Apply 1 application topically 2 (two) times daily.         Marland Kitchen levothyroxine (SYNTHROID, LEVOTHROID) 200 MCG tablet   Oral   Take 1 tablet (200 mcg total) by mouth  daily before breakfast.   90 tablet   1     Patient would like medication mailed to her home.   . neomycin-bacitracin-polymyxin (NEOSPORIN) ointment   Topical   Apply 1 application topically every 12 (twelve) hours. Patient not taking: Reported on 02/27/2015   15 g   1   . oxyCODONE-acetaminophen (ROXICET) 5-325 MG tablet   Oral   Take 1-2 tablets by mouth every 4 (four) hours as needed for severe pain.   15 tablet   0     Allergies Aspirin; Dilantin ; Macrolides and ketolides; Phenobarbital; and Penicillins  No family history on file.  Social History Social History  Substance Use Topics  . Smoking status: Current Some Day Smoker    Types: Cigarettes  . Smokeless tobacco: None  . Alcohol Use: No    Review of Systems Unable to obtain review of systems due to acute agitation, combativeness, patient not answering questions.  ____________________________________________   PHYSICAL EXAM:  VITAL SIGNS: ED Triage Vitals  Enc Vitals Group     BP 05/21/15 1256 154/94 mmHg     Pulse Rate 05/21/15 1256 63     Resp 05/21/15 1256 18     Temp 05/21/15 1256 98.3 F (36.8 C)     Temp Source 05/21/15 1256 Oral     SpO2 05/21/15 1256 100 %     Weight 05/21/15 1256 150 lb (68.04 kg)     Height 05/21/15 1256 5\' 6"  (1.676 m)     Head Cir --      Peak  Flow --      Pain Score --      Pain Loc --      Pain Edu? --      Excl. in [place]? --     Constitutional: Alert, agitated, combative. ENT   Head: Normocephalic and atraumatic.   Mouth/Throat: Mucous membranes are moist. Cardiovascular: Normal rate, regular rhythm. No murmurs, rubs, or gallops. After sedation. Respiratory: Normal respiratory effort without tachypnea nor retractions. Breath sounds are clear (after sedation)  Gastrointestinal: Soft, no distention. Musculoskeletal: Nontender with normal range of motion in all extremities. Atraumatic appearing Neurologic:  Moves all extremities, no gross deficits.  Initially quite agitated now somnolent after sedation. Skin:  Skin is warm, dry  Psychiatric: Mood and affect are normal. Speech and behavior are normal.   ____________________________________________     INITIAL IMPRESSION / ASSESSMENT AND PLAN / ED COURSE  Pertinent labs & imaging results that were available during my care of the patient were reviewed by me and considered in my medical decision making (see chart for details).  Patient presented to the emergency department very agitated, stating she was going to overdose and jump in the traffic to kill herself. Patient involuntarily committed and brought back to the emergency department. Patient received IM sedation. Initially quite agitated, combative, after IM sedation quite somnolent. After sedation was able to examine the patient's heart lungs abdomen and extremities. Patient will awaken to painful stimuli only answer simple questions before falling back asleep. We will have the patient evaluated by psychiatry when she is more alert. Labs are pending at this time.  ____________________________________________   FINAL CLINICAL IMPRESSION(S) / ED DIAGNOSES  Agitation Suicidal ideation   Harvest Dark, MD 05/21/15 1457  Harvest Dark, MD 05/21/15 [phone]

## 2015-05-21 NOTE — Consult Note (Signed)
  Psychiatry: 55 year old woman with a history of psychiatric problems and multiple prior hospitalizations. According to the chart she was making suicidal statements in the waiting room. She has now been placed under involuntary commitment papers. Evidently patient was becoming very agitated and was given 2 mg of Ativan IM.  I came to see the patient just now and found her lying in a stretcher in a hospital room with her eyes closed. She was moving her legs but did not open her eyes or make any indication that she heard me when I spoke her name multiple times loudly in the room and introduced myself.  Possibly sedated possibly simply unwilling to speak with me possibly a combination of those. I will continue to follow-up. Chart reviewed and labs reviewed. Drug screen not back yet.

## 2015-05-21 NOTE — ED Notes (Signed)
Pt resting quietly in room, agreed to giving blood for blood work. Will continue to monitor.

## 2015-05-21 NOTE — ED Notes (Signed)
Pt ambulatory to bathroom at this time with no concerns.

## 2015-05-21 NOTE — Progress Notes (Signed)
TTS has attempted to assess the pt for a second time. Pt was unable to be aroused. TTS did consult with pts nurse, pt received 2 mgs of ativan @ 1330 and appears to be sedated.   05/21/2015 Con Memos, MS, [place], [facility]CA Therapeutic Triage Specialist

## 2015-05-21 NOTE — ED Notes (Signed)
CSW Elmyra Ricks at bedside at this time

## 2015-05-21 NOTE — Progress Notes (Signed)
Per Dr. Weber Cooks' report the pt is unable to be assessed at this time due to presentation and AMS. Pt unresponsive and unable to be stimulated at this time. TTS will attempt to reassess pt at a more appropriate time.   05/21/2015 Con Memos, MS, [place], [facility] Therapeutic Triage Specialist

## 2015-05-21 NOTE — ED Notes (Signed)
Pt sister called at this time, pt speaking to sister now

## 2015-05-21 NOTE — ED Notes (Signed)
Sister present at bedside at this time via [employer] officer, pt gave all jewelry at this time. Sister also requesting all of pt's belongings that pt came in with, two bags of belongings given to sister at this time. Pt made aware and verbalized understanding at this time. Pt calm and cooperative at this time

## 2015-05-21 NOTE — BHH Counselor (Signed)
Patient continues to be either possibly sedated or unwilling to speak with me.  TTS will continue to follow-up
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
## 2015-05-22 DIAGNOSIS — F3160 Bipolar disorder, current episode mixed, unspecified: Secondary | ICD-10-CM | POA: Diagnosis not present

## 2015-05-22 LAB — URINALYSIS COMPLETE WITH MICROSCOPIC (ARMC ONLY)
Bacteria, UA: NONE SEEN
Bilirubin Urine: NEGATIVE
Glucose, UA: NEGATIVE mg/dL
HGB URINE DIPSTICK: NEGATIVE
KETONES UR: NEGATIVE mg/dL
NITRITE: NEGATIVE
PH: 6 (ref 5.0–8.0)
PROTEIN: NEGATIVE mg/dL
SPECIFIC GRAVITY, URINE: 1.004 — AB (ref 1.005–1.030)

## 2015-05-22 LAB — URINE DRUG SCREEN, QUALITATIVE (ARMC ONLY)
Amphetamines, Ur Screen: NOT DETECTED
Barbiturates, Ur Screen: NOT DETECTED
Benzodiazepine, Ur Scrn: NOT DETECTED
COCAINE METABOLITE, UR ~~LOC~~: POSITIVE — AB
Cannabinoid 50 Ng, Ur ~~LOC~~: POSITIVE — AB
MDMA (ECSTASY) UR SCREEN: NOT DETECTED
Methadone Scn, Ur: NOT DETECTED
Opiate, Ur Screen: NOT DETECTED
PHENCYCLIDINE (PCP) UR S: NOT DETECTED
TRICYCLIC, UR SCREEN: NOT DETECTED

## 2015-05-22 MED ORDER — ZIPRASIDONE MESYLATE 20 MG IM SOLR
20.0000 mg | Freq: Once | INTRAMUSCULAR | Status: AC
  Administered 2015-05-22: 20 mg via INTRAMUSCULAR

## 2015-05-22 MED ORDER — ARIPIPRAZOLE 10 MG PO TABS: 20.0000 mg | ORAL_TABLET | Freq: Every day | ORAL | Status: DC

## 2015-05-22 MED ORDER — CARBAMAZEPINE 200 MG PO TABS
200.0000 mg | ORAL_TABLET | Freq: Three times a day (TID) | ORAL | Status: DC
  Filled 2015-05-22: qty 1

## 2015-05-22 MED ORDER — LORAZEPAM 1 MG PO TABS
1.0000 mg | ORAL_TABLET | Freq: Once | ORAL | Status: AC
Start: 2015-05-22 — End: 2015-05-22
  Administered 2015-05-22: 1 mg via ORAL
  Filled 2015-05-22: qty 1

## 2015-05-22 MED ORDER — ZIPRASIDONE MESYLATE 20 MG IM SOLR
20.0000 mg | INTRAMUSCULAR | Status: DC
  Administered 2015-05-22: 20 mg via INTRAMUSCULAR

## 2015-05-22 MED ORDER — ZIPRASIDONE MESYLATE 20 MG IM SOLR
INTRAMUSCULAR | Status: AC
  Administered 2015-05-22: 20 mg via INTRAMUSCULAR
  Filled 2015-05-22: qty 20

## 2015-05-22 NOTE — ED Notes (Signed)
Patient and her sister do not want her to start meds

## 2015-05-22 NOTE — ED Notes (Signed)
BEHAVIORAL HEALTH ROUNDING Patient sleeping: Yes.   Patient alert and oriented: yes Behavior appropriate: Yes.  ; If no, describe:  Nutrition and fluids offered: Yes  Toileting and hygiene offered: Yes  Sitter present: no Law enforcement present: Yes  

## 2015-05-22 NOTE — ED Notes (Signed)
BEHAVIORAL HEALTH ROUNDING Patient sleeping: Yes.   Patient alert and oriented: yes Behavior appropriate: No.; If no, describe: arguementative Nutrition and fluids offered: Yes  Toileting and hygiene offered: Yes  Sitter present: no Law enforcement present: Yes
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
## 2015-05-22 NOTE — ED Notes (Signed)
Report received from Helen Reid., RN. Pt. Alert and oriented in no distress denies SI, HI, AVH and pain.  Pt. Instructed to come to me with problems or concerns.Will continue to monitor for safety via security cameras and Q 15 minute checks.

## 2015-05-22 NOTE — ED Notes (Signed)
Attempted to discuss pt status , but she would not answer me or acknowledged my presence
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
## 2015-05-22 NOTE — ED Notes (Signed)
Pt accepted med for [employer]

## 2015-05-22 NOTE — ED Notes (Signed)
Patient brought to emergency room under IVC when she admitted to suicidal ideation and a plan to overdose on pills or run in front of a car. Patient also abuses cocaine and marijuana. Patient currently was not able to contract for safety and cannot describe the source of her suicidal ideation. Denies hallucinations and HI. Patient has an irritable affect and is not complaint with medication. Patient wishes to be treated at [facility]

## 2015-05-22 NOTE — ED Notes (Signed)
BEHAVIORAL HEALTH ROUNDING Patient sleeping: Yes.   Patient alert and oriented: yes Behavior appropriate: Yes.  ; If no, describe: calm at this time Nutrition and fluids offered: Yes  Toileting and hygiene offered: Yes  Sitter present: no Law enforcement present: Yes

## 2015-05-22 NOTE — ED Notes (Signed)
ENVIRONMENTAL ASSESSMENT Potentially harmful objects out of patient reach: Yes Personal belongings secured: Yes Patient dressed in hospital provided attire only: Yes Plastic bags out of patient reach: Yes Patient care equipment (cords, cables, call bells, lines, and drains) shortened, removed, or accounted for: Yes Equipment and supplies removed from bottom of stretcher: Yes Potentially toxic materials out of patient reach: Yes Sharps container removed or out of patient reach: Yes  Patient currently in room resting. Maintained on 15 minute checks and observation by security camera for safety.

## 2015-05-22 NOTE — ED Provider Notes (Signed)
-----------------------------------------   6:41 AM on 05/22/2015 -----------------------------------------   Blood pressure 154/94, pulse 63, temperature 98.3 F (36.8 C), temperature source Oral, resp. rate 18, height 5\' 6"  (1.676 m), weight 150 lb (68.04 kg), SpO2 100 %.  Patient was given Ativan per her request overnight.  Calm and cooperative at this time.  Disposition is pending per Psychiatry/Behavioral Medicine team recommendations.     Paulette Blanch, MD 05/22/15 [phone]

## 2015-05-22 NOTE — ED Notes (Signed)

## 2015-05-22 NOTE — ED Notes (Signed)
Patient assigned to appropriate care area. Patient oriented to unit/care area: Informed that, for their safety, care areas are designed for safety and monitored by security cameras at all times; and visiting hours explained to patient. Patient verbalizes understanding, and verbal contract for safety obtained. 

## 2015-05-22 NOTE — ED Notes (Signed)
Pt. Noted in room. No complaints or concerns voiced. No distress or abnormal behavior noted. Will continue to monitor with security cameras. Q 15 minute rounds continue. 

## 2015-05-23 ENCOUNTER — Inpatient Hospital Stay
Admission: EM | Admit: 2015-05-23 | Discharge: 2015-05-29 | DRG: 885 | Disposition: A | Discharge status: Home / Self Care | Payer: Medicaid Other | Source: Intra-hospital | Attending: Psychiatry | Admitting: Psychiatry

## 2015-05-23 DIAGNOSIS — Z9049 Acquired absence of other specified parts of digestive tract: Secondary | ICD-10-CM | POA: Diagnosis not present

## 2015-05-23 DIAGNOSIS — Z9889 Other specified postprocedural states: Secondary | ICD-10-CM

## 2015-05-23 DIAGNOSIS — M1990 Unspecified osteoarthritis, unspecified site: Secondary | ICD-10-CM | POA: Diagnosis present

## 2015-05-23 DIAGNOSIS — F17210 Nicotine dependence, cigarettes, uncomplicated: Secondary | ICD-10-CM | POA: Diagnosis present

## 2015-05-23 DIAGNOSIS — M25559 Pain in unspecified hip: Secondary | ICD-10-CM | POA: Diagnosis present

## 2015-05-23 DIAGNOSIS — I959 Hypotension, unspecified: Secondary | ICD-10-CM | POA: Diagnosis present

## 2015-05-23 DIAGNOSIS — Z9119 Patient's noncompliance with other medical treatment and regimen: Secondary | ICD-10-CM | POA: Diagnosis not present

## 2015-05-23 DIAGNOSIS — F1220 Cannabis dependence, uncomplicated: Secondary | ICD-10-CM | POA: Diagnosis present

## 2015-05-23 DIAGNOSIS — E039 Hypothyroidism, unspecified: Secondary | ICD-10-CM | POA: Diagnosis present

## 2015-05-23 DIAGNOSIS — F515 Nightmare disorder: Secondary | ICD-10-CM | POA: Diagnosis present

## 2015-05-23 DIAGNOSIS — Z91410 Personal history of adult physical and sexual abuse: Secondary | ICD-10-CM

## 2015-05-23 DIAGNOSIS — Z853 Personal history of malignant neoplasm of breast: Secondary | ICD-10-CM | POA: Diagnosis not present

## 2015-05-23 DIAGNOSIS — R45851 Suicidal ideations: Secondary | ICD-10-CM | POA: Diagnosis present

## 2015-05-23 DIAGNOSIS — Z8589 Personal history of malignant neoplasm of other organs and systems: Secondary | ICD-10-CM

## 2015-05-23 DIAGNOSIS — Z888 Allergy status to other drugs, medicaments and biological substances status: Secondary | ICD-10-CM

## 2015-05-23 DIAGNOSIS — F331 Major depressive disorder, recurrent, moderate: Secondary | ICD-10-CM | POA: Diagnosis present

## 2015-05-23 DIAGNOSIS — F3160 Bipolar disorder, current episode mixed, unspecified: Secondary | ICD-10-CM | POA: Diagnosis present

## 2015-05-23 DIAGNOSIS — F419 Anxiety disorder, unspecified: Secondary | ICD-10-CM | POA: Diagnosis present

## 2015-05-23 DIAGNOSIS — F17200 Nicotine dependence, unspecified, uncomplicated: Secondary | ICD-10-CM

## 2015-05-23 DIAGNOSIS — F3164 Bipolar disorder, current episode mixed, severe, with psychotic features: Secondary | ICD-10-CM | POA: Diagnosis not present

## 2015-05-23 DIAGNOSIS — Z8572 Personal history of non-Hodgkin lymphomas: Secondary | ICD-10-CM | POA: Diagnosis not present

## 2015-05-23 DIAGNOSIS — K589 Irritable bowel syndrome without diarrhea: Secondary | ICD-10-CM | POA: Diagnosis present

## 2015-05-23 DIAGNOSIS — F1590 Other stimulant use, unspecified, uncomplicated: Secondary | ICD-10-CM

## 2015-05-23 DIAGNOSIS — F603 Borderline personality disorder: Secondary | ICD-10-CM

## 2015-05-23 DIAGNOSIS — Z880 Allergy status to penicillin: Secondary | ICD-10-CM

## 2015-05-23 DIAGNOSIS — K581 Irritable bowel syndrome with constipation: Secondary | ICD-10-CM | POA: Diagnosis present

## 2015-05-23 DIAGNOSIS — F1410 Cocaine abuse, uncomplicated: Secondary | ICD-10-CM | POA: Diagnosis present

## 2015-05-23 DIAGNOSIS — I129 Hypertensive chronic kidney disease with stage 1 through stage 4 chronic kidney disease, or unspecified chronic kidney disease: Secondary | ICD-10-CM | POA: Diagnosis present

## 2015-05-23 DIAGNOSIS — F4310 Post-traumatic stress disorder, unspecified: Secondary | ICD-10-CM

## 2015-05-23 DIAGNOSIS — N182 Chronic kidney disease, stage 2 (mild): Secondary | ICD-10-CM | POA: Diagnosis present

## 2015-05-23 DIAGNOSIS — J449 Chronic obstructive pulmonary disease, unspecified: Secondary | ICD-10-CM | POA: Diagnosis present

## 2015-05-23 DIAGNOSIS — A150 Tuberculosis of lung: Secondary | ICD-10-CM

## 2015-05-23 DIAGNOSIS — E781 Pure hyperglyceridemia: Secondary | ICD-10-CM | POA: Diagnosis present

## 2015-05-23 DIAGNOSIS — M2550 Pain in unspecified joint: Secondary | ICD-10-CM | POA: Diagnosis present

## 2015-05-23 MED ORDER — ALUM & MAG HYDROXIDE-SIMETH 200-200-20 MG/5ML PO SUSP: 30.0000 mL | ORAL | Status: DC | PRN

## 2015-05-23 MED ORDER — ARIPIPRAZOLE 10 MG PO TABS
20.0000 mg | ORAL_TABLET | Freq: Every day | ORAL | Status: DC
  Filled 2015-05-23: qty 2

## 2015-05-23 MED ORDER — ACETAMINOPHEN 325 MG PO TABS: 650.0000 mg | ORAL_TABLET | Freq: Four times a day (QID) | ORAL | Status: DC | PRN

## 2015-05-23 MED ORDER — MAGNESIUM HYDROXIDE 400 MG/5ML PO SUSP: 30.0000 mL | Freq: Every day | ORAL | Status: DC | PRN

## 2015-05-23 MED ORDER — CARBAMAZEPINE 200 MG PO TABS
200.0000 mg | ORAL_TABLET | Freq: Three times a day (TID) | ORAL | Status: DC
  Administered 2015-05-25: 200 mg via ORAL
  Filled 2015-05-23 (×3): qty 1

## 2015-05-23 MED ORDER — NICOTINE 21 MG/24HR TD PT24
21.0000 mg | MEDICATED_PATCH | Freq: Every day | TRANSDERMAL | Status: DC
  Filled 2015-05-23 (×2): qty 1

## 2015-05-23 NOTE — ED Notes (Signed)
Patient sister Kenney Houseman called and spoke with nurse and stated that she would like to know the treatment plan once it is finalized by psychiatrist. She voiced concern about her sister's tearfulness and depression and stated that patient was tearful throughout their whole phone conversation. Patient will be re-evaluated by psychiatrist.

## 2015-05-23 NOTE — ED Notes (Signed)
Pt. Noted in room resting quietly;. No complaints or concerns voiced. No distress or abnormal behavior noted. Will continue to monitor with security cameras. Q 15 minute rounds continue. 

## 2015-05-23 NOTE — ED Notes (Signed)
Patient resting quietly in room. No noted distress or abnormal behaviors noted. Will continue 15 minute checks and observation by security camera for safety. 

## 2015-05-23 NOTE — ED Notes (Signed)
Patient received breakfast tray 

## 2015-05-23 NOTE — ED Notes (Signed)
Patient refuses vital signs to be assessed.

## 2015-05-23 NOTE — ED Provider Notes (Signed)
-----------------------------------------   6:24 AM on 05/23/2015 -----------------------------------------   Blood pressure 141/80, pulse 56, temperature 98.7 F (37.1 C), temperature source Oral, resp. rate 16, height 5\' 6"  (1.676 m), weight 150 lb (68.04 kg), SpO2 98 %.  The patient had no acute events since last update.  Calm and cooperative at this time.  Disposition is pending per Psychiatry/Behavioral Medicine team recommendations.     Paulette Blanch, MD 05/23/15 [phone]
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
## 2015-05-23 NOTE — ED Notes (Signed)
Patient received lunch tray. Patient currently has her head covered in the bed and has a sad and depressed affect and voice. Patient would not forward more information. Would continue to monitor. Maintained on 15 minute checks and observation by security camera for safety.

## 2015-05-23 NOTE — ED Notes (Signed)
Patient is tearful after speaking with sister on the phone. Patient says she feels very depressed. When asked to explained further, patient says she did not want to speak more at this time. Nurse offered to patient time and space to discuss feelings further whenever patient feels comfortable.

## 2015-05-23 NOTE — ED Notes (Signed)
Pt remains asleep not eating breakfast

## 2015-05-23 NOTE — ED Notes (Signed)
ENVIRONMENTAL ASSESSMENT Potentially harmful objects out of patient reach: Yes Personal belongings secured: Yes Patient dressed in hospital provided attire only: Yes Plastic bags out of patient reach: Yes Patient care equipment (cords, cables, call bells, lines, and drains) shortened, removed, or accounted for: Yes Equipment and supplies removed from bottom of stretcher: Yes Potentially toxic materials out of patient reach: Yes Sharps container removed or out of patient reach: Yes  Patient in bed sleeping. No signs of distress noted. Maintained on 15 minute checks and observation by security camera for safety.

## 2015-05-23 NOTE — ED Notes (Signed)
Patient is currently sleeping in bed. Patient shows no signs of acute distress. Maintained on 15 minute checks and observation by security camera for safety.

## 2015-05-23 NOTE — ED Notes (Signed)
Patient denies SI/HI/AVH and pain. Patient does not voice complaints at this time. Patient is guarded and forwards little information but is not agitated or restless at this time. Maintained on 15 minute checks and observation by security camera for safety.

## 2015-05-23 NOTE — ED Notes (Signed)
Pt. Noted in room resting quietly;. No complaints or concerns voiced ; refused p.m. Medication;. No distress or abnormal behavior noted. Will continue to monitor with security cameras. Q 15 minute rounds continue.

## 2015-05-23 NOTE — ED Notes (Signed)
Patient in room resting. Patient has no requests or complaints at this time. Will continue to monitor. Maintained on 15 minute checks and observation by security camera for safety.

## 2015-05-23 NOTE — Progress Notes (Signed)
Patient ID: Helen Reid, female   DOB: [birth year], 55 y.o.   MRN: [id number]  Pt admitted from [facility], tearful on arrival to unit, skin/contraband search done, skin intact no contraband found, scar to right abdomen, oriented to unit and pt's room, emotional support provided, pt requests to be able to take a shower and lay down, provided pt with toiletry items and towels.

## 2015-05-23 NOTE — ED Notes (Signed)
Patient continues to cry in room. Nurse provided emotional support. Maintained on 15 minute checks and observation by security camera for safety.

## 2015-05-23 NOTE — ED Notes (Signed)
Patient to be transferred to [facility] inpatient unit. Patient is tearful and is unable to state whether she has SI and does not contract for safety. Will transport to unit in police custody with all belongings. Patient consents to admission.

## 2015-05-23 NOTE — ED Notes (Signed)
Patient received dinner tray 

## 2015-05-23 NOTE — ED Notes (Signed)
Patient is currently in room resting. No signs of distress noted. Will continue to monitor for safety. Maintained on 15 minute checks and observation by security camera for safety.

## 2015-05-24 DIAGNOSIS — F3164 Bipolar disorder, current episode mixed, severe, with psychotic features: Secondary | ICD-10-CM

## 2015-05-24 MED ORDER — OLANZAPINE 5 MG PO TBDP: 7.5000 mg | ORAL_TABLET | Freq: Three times a day (TID) | ORAL | Status: DC | PRN

## 2015-05-24 MED ORDER — LORAZEPAM 1 MG PO TABS
1.0000 mg | ORAL_TABLET | Freq: Two times a day (BID) | ORAL | Status: DC | PRN
  Administered 2015-05-24 – 2015-05-27 (×5): 1 mg via ORAL
  Filled 2015-05-24 (×5): qty 1

## 2015-05-24 MED ORDER — OLANZAPINE 10 MG IM SOLR
7.5000 mg | Freq: Three times a day (TID) | INTRAMUSCULAR | Status: DC | PRN
Start: 2015-05-24 — End: 2015-05-26
  Filled 2015-05-24: qty 10

## 2015-05-24 MED ORDER — HALOPERIDOL LACTATE 5 MG/ML IJ SOLN: 5.0000 mg | Freq: Four times a day (QID) | INTRAMUSCULAR | Status: DC | PRN

## 2015-05-24 MED ORDER — HALOPERIDOL LACTATE 5 MG/ML IJ SOLN
INTRAMUSCULAR | Status: AC
  Filled 2015-05-24: qty 1

## 2015-05-24 MED ORDER — DIPHENHYDRAMINE HCL 25 MG PO CAPS
25.0000 mg | ORAL_CAPSULE | Freq: Two times a day (BID) | ORAL | Status: DC | PRN
  Administered 2015-05-26 – 2015-05-29 (×4): 25 mg via ORAL
  Filled 2015-05-24 (×4): qty 1

## 2015-05-24 MED ORDER — DIPHENHYDRAMINE HCL 50 MG/ML IJ SOLN
25.0000 mg | Freq: Two times a day (BID) | INTRAMUSCULAR | Status: DC | PRN
  Administered 2015-05-24: 25 mg via INTRAMUSCULAR
  Filled 2015-05-24: qty 1

## 2015-05-24 MED ORDER — TRAMADOL HCL 50 MG PO TABS
50.0000 mg | ORAL_TABLET | Freq: Three times a day (TID) | ORAL | Status: DC | PRN
  Administered 2015-05-24 – 2015-05-25 (×2): 50 mg via ORAL
  Filled 2015-05-24 (×2): qty 1

## 2015-05-24 NOTE — Plan of Care (Signed)
Problem: Aggression Towards others,Towards Self, and or Destruction Goal: STG-Patient will comply with prescribed medication regimen (Patient will comply with prescribed medication regimen) Outcome: Not Progressing Pt loud, argumentative and verbally aggressive. Refusing medications.

## 2015-05-24 NOTE — Progress Notes (Signed)
D: Pt covered with blanket in bed upon approach. Pt did not make eye contact with Probation officer. Pt was told what medications were ordered at 10am. Pt stated she was allergic to those meds and was not going to take them. Pt unwilling to answer many questions. Pt rated her pain a 20/10 in her hips.  Pt refused tylenol.   A: Writer offered emotional support. Encouraged group participation. Q 15 min checks continue.   R: Pt not compliant with medications. Pt did not attend groups.

## 2015-05-24 NOTE — Progress Notes (Signed)
D: Observed pt in room pacing. Patient alert. Patient refused to take part in assessment. Pt is very loud, angry, verbally aggressive, uncoopperative and argumentative. Pt stated "what do you want.Marland KitchenMarland KitchenI'm in pain." Pt would refuse to answer pain assessment questions, and got angry when Tylenol was mentioned. Pt demanded Probation officer leave room and Designer, fashion/clothing. Pt refused scheduled medications. Pt isolative to room. A: Offered active listening and support. Attempted to provide therapeutic communication. Removed myself from the patients room as not to escalate the situation. Checked back in on patient in 15 minutes. R: On reassessment, pt remains loud, angry, uncooperative and verbally aggressive. Pt stated "Get out of my room..you better get out of my room." Pt continues to refuse to take part in assessment. Will continue Q15 min. Checks. Safety maintained.

## 2015-05-24 NOTE — [facility] Group Notes (Signed)
[facility] LCSW Group Therapy  05/24/2015 1:58 PM  Type of Therapy:  Group Therapy  Participation Level:  Did Not Attend  Modes of Intervention:  Discussion, Education, Socialization and Support  Summary of Progress/Problems: Balance in life: Patients will discuss the concept of balance and how it looks and feels to be unbalanced. Pt will identify areas in their life that is unbalanced and ways to become more balanced.    Kaydin Karbowski L Delara Shepheard MSW, LCSWA  05/24/2015, 1:58 PM  
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
## 2015-05-24 NOTE — Progress Notes (Signed)
Pt slightly more amenable to assessment this morning. Patient denies SI and AVH. 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 Pt stated she will keep her distance from other patients when she is irritable, so as not to get into an altercation. Pt still loud, argumentative, verbally aggressive and irritable.
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
## 2015-05-25 MED ORDER — PRAZOSIN HCL 1 MG PO CAPS
1.0000 mg | ORAL_CAPSULE | Freq: Every day | ORAL | Status: DC
  Administered 2015-05-25: 1 mg via ORAL
  Filled 2015-05-25: qty 1
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
## 2015-05-25 NOTE — Progress Notes (Signed)
40 D: patient in the dayroom, visiting with her sister who appears to be supportive.  Patient is alert and oriented, pleasant and cooperative. States that she had a good day. Denies suicidal and homicidal thoughts. Denies hallucinations. No sign of distress noted.  A: patient was encouraged to communicate her needs and feelings  as they come. Emotional support offered and staff continue to monitor: One to one observations continue. R: Patient safety is maintained on the unit.
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
## 2015-05-25 NOTE — Plan of Care (Signed)
Problem: Aggression Towards others,Towards Self, and or Destruction Goal: STG-Patient will comply with prescribed medication regimen (Patient will comply with prescribed medication regimen)  Outcome: Not Progressing Pt still refuses Tegretol.

## 2015-05-25 NOTE — Progress Notes (Signed)
D: Pt calm this morning. Passive SI, contracts for safety. Remains on a 1:1. Denies HI and AVH. Rated pain a 20/10 in her hips. Refused her scheduled medications (pt did not raise voice or speak harshly) .Pt asked for ativan because she felt she was become irritable. RN administered medication as ordered.  Pt also requesting her thyroid level be checked. Writer informed pt an order was already in place for lab to do this blood draw. Pt was sleepy, didn't offer much to this Probation officer.   A: Writer offered emotional support, encouraged participation in unit programming. Q 15 min checks continue. 1:1 sitter continues for safety.  R: Pt less verbally aggressive. Remains unwilling to take scheduled medications.

## 2015-05-25 NOTE — Plan of Care (Signed)
Problem: Aggression Towards others,Towards Self, and or Destruction Goal: LTG - No aggression,physical/verbal/destruction prior to D/C (Patient will have no episodes of physical or verbal aggression or property destruction towards self or others for _____ day (s) prior to discharge.)  Outcome: Progressing No aggressive behavior noted. Cooperative

## 2015-05-25 NOTE — BHH Group Notes (Signed)
[place] LCSW Group Therapy  05/25/2015 2:26 PM  Type of Therapy:  Group Therapy  Participation Level:  Did Not Attend  Modes of Intervention:  Discussion, Education, Socialization and Support  Summary of Progress/Problems: Todays topic: Grudges  Patients will be encouraged to discuss their thoughts, feelings, and behaviors as to why one holds on to grudges and reasons why people have grudges. Patients will process the impact of grudges on their daily lives and identify thoughts and feelings related to holding grudges. Patients will identify feelings and thoughts related to what life would look like without grudges.   [place] MSW, [place]  05/25/2015, 2:26 PM

## 2015-05-25 NOTE — Plan of Care (Signed)
Problem: Diagnosis: Increased Risk For Suicide Attempt Goal: LTG-Patient Will Report Improved Mood and Deny Suicidal LTG (by discharge) Patient will report improved mood and deny suicidal ideation.  Outcome: Progressing Patient currently denies suicidal thoughts and willing to communicate change in feeling.

## 2015-05-26 ENCOUNTER — Encounter: Payer: Self-pay | Admitting: Psychiatry

## 2015-05-26 DIAGNOSIS — F17200 Nicotine dependence, unspecified, uncomplicated: Secondary | ICD-10-CM

## 2015-05-26 DIAGNOSIS — F331 Major depressive disorder, recurrent, moderate: Principal | ICD-10-CM

## 2015-05-26 DIAGNOSIS — F1220 Cannabis dependence, uncomplicated: Secondary | ICD-10-CM

## 2015-05-26 DIAGNOSIS — F1590 Other stimulant use, unspecified, uncomplicated: Secondary | ICD-10-CM

## 2015-05-26 LAB — THYROID PANEL WITH TSH
FREE THYROXINE INDEX: 0.7 — AB (ref 1.2–4.9)
T3 Uptake Ratio: 29 % (ref 24–39)
T4, Total: 2.3 ug/dL — ABNORMAL LOW (ref 4.5–12.0)
TSH: 57.79 u[IU]/mL — ABNORMAL HIGH (ref 0.450–4.500)

## 2015-05-26 MED ORDER — NICOTINE 14 MG/24HR TD PT24: 14.0000 mg | MEDICATED_PATCH | Freq: Every day | TRANSDERMAL | Status: DC

## 2015-05-26 MED ORDER — TRAMADOL HCL 50 MG PO TABS
50.0000 mg | ORAL_TABLET | Freq: Three times a day (TID) | ORAL | Status: DC | PRN
  Administered 2015-05-27 (×2): 50 mg via ORAL
  Filled 2015-05-26 (×2): qty 1

## 2015-05-26 MED ORDER — GABAPENTIN 400 MG PO CAPS
400.0000 mg | ORAL_CAPSULE | Freq: Two times a day (BID) | ORAL | Status: DC
Start: 2015-05-26 — End: 2015-05-28
  Filled 2015-05-26: qty 1

## 2015-05-26 MED ORDER — GABAPENTIN 800 MG PO TABS
400.0000 mg | ORAL_TABLET | Freq: Two times a day (BID) | ORAL | Status: DC
Start: 2015-05-26 — End: 2015-05-26
  Filled 2015-05-26 (×2): qty 0.5

## 2015-05-26 MED ORDER — FENOFIBRATE 54 MG PO TABS
135.0000 mg | ORAL_TABLET | Freq: Every day | ORAL | Status: DC
Start: 2015-05-26 — End: 2015-05-27
  Filled 2015-05-26: qty 2.5
  Filled 2015-05-26: qty 3
  Filled 2015-05-26: qty 2.5

## 2015-05-26 MED ORDER — LUBIPROSTONE 24 MCG PO CAPS
24.0000 ug | ORAL_CAPSULE | Freq: Two times a day (BID) | ORAL | Status: DC
  Administered 2015-05-27 – 2015-05-29 (×5): 24 ug via ORAL
  Filled 2015-05-26 (×9): qty 1

## 2015-05-26 MED ORDER — LIDOCAINE 5 % EX PTCH
1.0000 | MEDICATED_PATCH | CUTANEOUS | Status: DC
  Administered 2015-05-26 – 2015-05-28 (×3): 1 via TRANSDERMAL
  Filled 2015-05-26 (×5): qty 1

## 2015-05-26 MED ORDER — ETODOLAC 200 MG PO CAPS
400.0000 mg | ORAL_CAPSULE | Freq: Three times a day (TID) | ORAL | Status: DC | PRN
  Filled 2015-05-26: qty 2

## 2015-05-26 MED ORDER — LEVOTHYROXINE SODIUM 200 MCG PO TABS
200.0000 ug | ORAL_TABLET | Freq: Every day | ORAL | Status: DC
  Administered 2015-05-27 – 2015-05-29 (×3): 200 ug via ORAL
  Filled 2015-05-26 (×4): qty 1
  Filled 2015-05-26: qty 8

## 2015-05-26 NOTE — Progress Notes (Signed)
0300 D. Patient slept throughout the night but woke up around 3 am  Asking for iced water.  A: Patient had water and remained calm. Returned to bed [phone] and has been sleeping. Remains on 1:1 observation for safety precautions. R: Patient remains safe on the unit, staff monitoring.

## 2015-05-26 NOTE — Progress Notes (Signed)
Recreation Therapy Notes  INPATIENT RECREATION THERAPY ASSESSMENT  Patient Details Name: Olina Grevious MRN: [id number] DOB: Jun 19, 1960 Today's Date: 05/26/2015  Patient Stressors: Family, Death, Other (Comment) (Daughter and brother are triggers; Fiance passed away a couple years ago; supposed to have a nurse to come in 50 hours a week, but she doesn't)  Coping Skills:   Isolate, Substance Abuse, Avoidance, Self-Injury, Art/Dance, Music, Sports, Other (Comment) (Write)  Personal Challenges: Anger, Concentration, Relationships, Self-Esteem/Confidence, Social Interaction, Stress Management, Time Management, Trusting Others  Leisure Interests (2+):  Individual - Other (Comment) (Target practice, throwing knives)  Awareness of Community Resources:  No  Community Resources:     Current Use:    If no, Barriers?:    Patient Strengths:  Children, creativity  Patient Identified Areas of Improvement:  Pain management  Current Recreation Participation:  Interior decorating  Patient Goal for Hospitalization:  To get out  [place] of Residence:  [place] of Residence:  [place]   Current SI (including self-harm):   ("Every now and then")  Current HI:   ("Every now and then")  Consent to Intern Participation: N/A   Leonette Monarch, LRT/CTRS 05/26/2015, 4:49 PM
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
## 2015-05-26 NOTE — BHH Group Notes (Signed)
[facility] LCSW Group Therapy  05/26/2015 11:05 AM  Type of Therapy:  Group Therapy  Participation Level:  Did Not Attend   August Saucer, MSW, LCSW 05/26/2015, 11:05 AM

## 2015-05-26 NOTE — Progress Notes (Signed)
Recreation Therapy Notes  Date: 01.09.17 Time: 3:00 pm Location: Craft Room  Group Topic: Self-expression  Goal Area(s) Addresses:  Patient will identify one color per emotion listed on wheel. Patient will verbalize benefit of using art as a means of self-expression. Patient will verbalize one emotion experienced during session. Patient will be educated on other forms of self-expression.  Behavioral Response: Did not attend  Intervention: Emotion Wheel  Activity: Patients were given a worksheet with 7 emotions on it and instructed to pick a color for each emotion.  Education: LRT educated patients on different forms of self-expression.  Education Outcome: Patient did not attend group.  Clinical Observations/Feedback: Patient did not attend group.  Leonette Monarch, LRT/CTRS 05/26/2015 4:24 PM

## 2015-05-26 NOTE — BHH Group Notes (Signed)
[place] Group Notes:  (Nursing/MHT/Case Management/Adjunct)  Date:  05/26/2015  Time:  12:31 PM  Type of Therapy:  Psychoeducational Skills  Participation Level:  Did Not Attend  Celso Amy 05/26/2015, 12:31 PM

## 2015-05-26 NOTE — Progress Notes (Addendum)
D: Patient denies HI/AVH. Patient endorses passive SI but agrees to contract.   Patient affect is flat and her mood is labile.  Patient did NOT attend evening group. Patient remained in her room for the majority of the shift. A: Support and encouragement offered. Patient refused her scheduled medications.  MD made aware. 1:1 observation continued for patient safety. R: Patient receptive. Patient remains safe on the unit.

## 2015-05-26 NOTE — Progress Notes (Signed)
Patient denies SI/HI/AVH, no needs voiced, no distress. Patient is flat with labile mood, minimal interaction with peers and staff. RN will continue to offer evening meds, though pt has been refusing them. Support and encouragement offered. Safety maintained, 1:1 continued for safety.

## 2015-05-26 NOTE — [facility] Group Notes (Signed)
[facility] Group Notes:  (Nursing/MHT/Case Management/Adjunct)  Date:  05/26/2015  Time:  3:29 AM  Type of Therapy:  Group Therapy  Participation Level:  Did Not Attend    Summary of Progress/Problems:  Helen Reid 05/26/2015, 3:29 AM

## 2015-05-27 ENCOUNTER — Ambulatory Visit: Payer: Medicaid Other | Admitting: Family Medicine

## 2015-05-27 LAB — HEMOGLOBIN A1C: HEMOGLOBIN A1C: 5.4 % (ref 4.0–6.0)

## 2015-05-27 LAB — LIPID PANEL
CHOL/HDL RATIO: 3.7 ratio
Cholesterol: 233 mg/dL — ABNORMAL HIGH (ref 0–200)
HDL: 63 mg/dL (ref >40)
LDL CALC: 154 mg/dL — AB (ref 0–99)
Triglycerides: 80 mg/dL (ref <150)
VLDL: 16 mg/dL (ref 0–40)

## 2015-05-27 MED ORDER — PRAZOSIN HCL 1 MG PO CAPS
1.0000 mg | ORAL_CAPSULE | Freq: Every day | ORAL | Status: DC
  Administered 2015-05-27: 1 mg via ORAL
  Filled 2015-05-27: qty 1

## 2015-05-27 MED ORDER — LORAZEPAM 0.5 MG PO TABS
0.5000 mg | ORAL_TABLET | Freq: Two times a day (BID) | ORAL | Status: DC | PRN
Start: 2015-05-27 — End: 2015-05-29
  Administered 2015-05-27 – 2015-05-28 (×3): 0.5 mg via ORAL
  Filled 2015-05-27 (×3): qty 1

## 2015-05-27 NOTE — Plan of Care (Signed)
Problem: Ineffective individual coping Goal: LTG: Patient will report a decrease in negative feelings Outcome: Not Progressing Working on Radiographer, therapeutic

## 2015-05-27 NOTE — [facility] Suicide Risk Assessment (Signed)
[place] INPATIENT:  Family/Significant Other Suicide Prevention Education  Suicide Prevention Education: Patient Refusal for Family/Significant Other Suicide Prevention Education: The patient Helen Reid has refused to provide written consent for family/significant other to be provided Family/Significant Other Suicide Prevention Education during admission and/or prior to discharge.  Physician notified.  CSW completed with pt.  [name] Bexley Mclester 05/27/2015, 5:55 PM
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
## 2015-05-27 NOTE — Progress Notes (Signed)
Pt stated that she was nauseated, RN gave pt a ginger ale as patient requested. RN asked if patient would like any medication, but pt denied, preferring the ginger ale. Patient stated she just wanted to be left alone.
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
## 2015-05-27 NOTE — Progress Notes (Signed)
Recreation Therapy Notes  Date: 01.10.17 Time: 3:00 pm Location: Craft Room  Group Topic: Goal Setting  Goal Area(s) Addresses:  Patient will write down one goal. Patient will write at least one encouraging statement.  Behavioral Response: Did not attend  Intervention: Step By Step  Activity: Patients were given a foot and instructed to write a goal towards their recovery on the inside of the foot and write positive statements on the outside.  Education: LRT educated patients on ways they can stay focused on their goals.  Education Outcome: Patient did not attend group.  Clinical Observations/Feedback: Patient did not attend group.  Leonette Monarch, LRT/CTRS 05/27/2015 4:11 PM

## 2015-05-27 NOTE — Progress Notes (Signed)
Initial Nutrition Assessment    INTERVENTION:   Meals and Snacks: appetite appears good at present, encourage menu completion to best meet pt preferences  NUTRITION DIAGNOSIS:   Reassess on follow-up  GOAL:   Patient will meet greater than or equal to 90% of their needs  MONITOR:    (Energy Intake, Anthropometrics, Digestive System)  REASON FOR ASSESSMENT:   Malnutrition Screening Tool    ASSESSMENT:    Pt admitted with major depressive disorder, recurrent, moderate  Past Medical History  Diagnosis Date  . Hypertension   . Anxiety   . Depression   . Cancer [facility])     lymphoma, gallbladder, breast     Diet Order:  Diet regular Room service appropriate?: Yes; Fluid consistency:: Thin   Energy Intake: recorded po intake mostly 90-100% of meals  Electrolyte and Renal Profile:  Recent Labs Lab 05/21/15 1430  BUN 13  CREATININE 1.38*  NA 139  K 3.4*   Glucose Profile: No results for input(s): GLUCAP in the last 72 hours. Meds: reviewed  Height:   Ht Readings from Last 1 Encounters:  05/23/15 5\' 6"  (1.676 m)    Weight:   Wt Readings from Last 1 Encounters:  05/23/15 143 lb (64.864 kg)    Filed Weights   05/23/15 1838  Weight: 143 lb (64.864 kg)   Wt Readings from Last 10 Encounters:  05/23/15 143 lb (64.864 kg)  05/21/15 150 lb (68.04 kg)  02/27/15 151 lb 1.6 oz (68.539 kg)  02/06/15 160 lb (72.576 kg)  02/04/15 160 lb (72.576 kg)  10/17/14 161 lb 9.6 oz (73.301 kg)     BMI:  Body mass index is 23.09 kg/(m^2).  LOW Care Level  Kerman Passey MS, [place], LDN [phone] Pager  [phone] Weekend/On-Call Pager

## 2015-05-27 NOTE — BHH Group Notes (Signed)
[facility] Group Notes:  (Nursing/MHT/Case Management/Adjunct)  Date:  05/27/2015  Time:  4:54 PM  Type of Therapy:  Psychoeducational Skills  Participation Level:  Did Not Attend   Adela Lank [facility] 05/27/2015, 4:54 PM

## 2015-05-28 ENCOUNTER — Inpatient Hospital Stay: Payer: Medicaid Other

## 2015-05-28 MED ORDER — LEVOTHYROXINE SODIUM 200 MCG PO TABS: 200.0000 ug | ORAL_TABLET | Freq: Every day | ORAL | Status: DC

## 2015-05-28 MED ORDER — DIPHENHYDRAMINE HCL 25 MG PO CAPS: 25.0000 mg | ORAL_CAPSULE | Freq: Every evening | ORAL | Status: DC | PRN

## 2015-05-28 MED ORDER — LUBIPROSTONE 24 MCG PO CAPS: 24.0000 ug | ORAL_CAPSULE | Freq: Two times a day (BID) | ORAL | Status: DC

## 2015-05-28 MED ORDER — PRAZOSIN HCL 1 MG PO CAPS: 1.0000 mg | ORAL_CAPSULE | Freq: Every day | ORAL | Status: DC

## 2015-05-28 MED ORDER — ETODOLAC 200 MG PO CAPS: 400.0000 mg | ORAL_CAPSULE | Freq: Three times a day (TID) | ORAL | Status: DC | PRN

## 2015-05-28 MED ORDER — LIDOCAINE 5 % EX PTCH: 1.0000 | MEDICATED_PATCH | CUTANEOUS | Status: DC

## 2015-05-28 NOTE — Progress Notes (Signed)
Recreation Therapy Notes  At approximately 1:30 pm, LRT attempted treatment session, patient was sleeping.  Leonette Monarch, LRT/CTRS 05/28/2015 5:25 PM

## 2015-05-28 NOTE — [facility] Group Notes (Signed)
[facility] LCSW Aftercare Discharge Planning Group Note   05/28/2015 4:40 PM  Participation Quality:  Did not attend   [place] MSW, [place]

## 2015-05-28 NOTE — Progress Notes (Signed)
Recreation Therapy Notes  Date: 01.11.17 Time: 3:00 pm Location: Craft Room  Group Topic: Self-esteem  Goal Area(s) Addresses:  Patient will write at least one positive trait. Patient will verbalize benefit of having a healthy self-esteem.  Behavioral Response: Did not attend  Intervention: I Am  Activity: Patients were given a worksheet with the letter I and instructed to write as many positive traits inside the letter as they could.  Education: LRT educated patients on ways they can increase their self-esteem.  Education Outcome: Patient did not attend group.   Clinical Observations/Feedback: Patient did not attend group.  Leonette Monarch, LRT/CTRS 05/28/2015 4:37 PM

## 2015-05-28 NOTE — B[facility] Group Notes (Signed)
[place] Group Notes:  (Nursing/MHT/Case Management/Adjunct)  Date:  05/28/2015  Time:  9:16 PM  Type of Therapy:  Evening Wrap-up Group  Participation Level:  Did Not Attend  Participation Quality:  N/A  Affect:  N/A  Cognitive:  N/A  Insight:  None  Engagement in Group:  N/A  Modes of Intervention:  Discussion  Summary of Progress/Problems:  Levonne Spiller 05/28/2015, 9:16 PM
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
## 2015-05-28 NOTE — Plan of Care (Signed)
Problem: Ineffective individual coping Goal: STG: Patient will remain free from self harm Outcome: Progressing Patient has remained free from harm since admission

## 2015-05-28 NOTE — Progress Notes (Addendum)
D: Patient stated slept poor last night .Stated appetite isfair and energy level  Is normal. Stated concentration is good . Stated on Depression scale10 hopeless 10 and anxiety 10 .( low 0-10 high) Voice  Of suicidal  l ideations  .  No auditory hallucinations  No pain concerns . Appropriate ADL'S. Interacting with peers and staff. Patient very needy Upset that she feels no one is helping her . Patient is not participating with unit programing . Remains in bed all day . Periods of tearfulness . Upset about her possible discharge  tomorrow A: Encourage patient participation with unit programming . Instruction  Given on  Medication , verbalize understanding. R: Voice no other concerns. Staff continue to monitor

## 2015-05-28 NOTE — BHH Group Notes (Signed)
[place] LCSW Group Therapy  05/28/2015 5:26 PM  Type of Therapy:  Group Therapy  Participation Level:  Did Not Attend  Modes of Intervention:  Discussion, Education, Socialization and Support  Summary of Progress/Problems: Emotional Regulation: Patients will identify both negative and positive emotions. They will discuss emotions they have difficulty regulating and how they impact their lives. Patients will be asked to identify healthy coping skills to combat unhealthy reactions to negative emotions.     [place] MSW, [place]  05/28/2015, 5:26 PM

## 2015-05-28 NOTE — Progress Notes (Signed)
D: Patient is very labile. When trying to speak to her she walked out of the room during the conversation. When asked if she could tell me what brought her here she asked, "why?" She then proceeded to tell me that she was leaving tomorrow so there was no reason for me to know. When I asked her to stay in the room to answer a few questions she stated, "sis, I was about to go to bed". She denies SI/HI/AVH. She stated pain but then said I'm fine.  A: Medication was given with education. Encouragement was provided.  R: Patient refused Neurontin. Safety maintained with 15 min checks.

## 2015-05-28 NOTE — Plan of Care (Signed)
Problem: Ineffective individual coping Goal: LTG: Patient will report a decrease in negative feelings Outcome: Not Progressing Periods of tearfulness no group participation.

## 2015-05-29 DIAGNOSIS — F4310 Post-traumatic stress disorder, unspecified: Secondary | ICD-10-CM

## 2015-05-29 DIAGNOSIS — F603 Borderline personality disorder: Secondary | ICD-10-CM

## 2015-05-29 NOTE — Progress Notes (Signed)
D: Pt isolated to room majority of shift. Did not attend group. Minipress held due to low BP and pulse. Irritable during interaction. Denies SI. Compliant with evening meds. PRN ativan and benadryl given.  A: Encourage patient participation with unit programming. Medications given as prescribed. Q15 minute checks maintained for safety.  R: Pt forwards little, irritable. Affect depressed. Denies SI. Slept.6.45 hrs. Staff will continue to monitor.

## 2015-05-29 NOTE — Progress Notes (Signed)
Patient discharged home. DC instructions provided and explained. Medications reviewed. Rx given. All questions answered. Pt stable at discharge. Denies SI, HI, AVH. Belongings returned.

## 2015-05-29 NOTE — Plan of Care (Signed)
Problem: [facility] Participation in Recreation Therapeutic Interventions Goal: STG-Patient will demonstrate improved self esteem by identif STG: Self-Esteem - Within 3 treatment sessions, patient will verbalize at least 5 positive affirmation statements in one treatment session to increase self-esteem post d/c.  Outcome: Adequate for Discharge Treatment Session 1; Completed 0 out of 1: At approximately 12:05 pm, LRT met with patient in patient room. Patient verbalized 3 positive affirmations. Patient refused to say the other because she said she did not need to. LRT encouraged patient to continue saying positive affirmation statements. Intervention Used: I Am statements  Leonette Monarch, LRT/CTRS 01.12.17 1:59 pm Goal: STG-Patient will identify at least five coping skills for ** STG: Coping Skills - Within 3 treatment sessions, patient will verbalize at least 5 coping skills for anger in one treatment session to increase anger management skills post d/c.  Outcome: Completed/Met Date Met:  05/29/15 Treatment Session 1; Completed 1 out of 2: At approximately 12:05 pm, LRT met with patient in patient room. Patient verbalized 5 coping skills for anger. LRT educated patient on triggers, how the body can respond to anger, and ways she can remind herself to use her coping skills. Intervention Used: Coping Skills worksheet  Leonette Monarch, LRT/CTRS 01.12.17 2:01 pm Goal: STG-Other Recreation Therapy Goal (Specify) STG: Stress Management - Within 3 treatment sessions, patient will verbalize understanding of the stress management techniques in one treatment session to increase stress management skills post d/c.  Outcome: Completed/Met Date Met:  05/29/15 Treatment Session 1; Completed 1 out of 1: At approximately 12:05 pm, LRT met with patient in patient room. Patient did not want LRT to explain the techniques. LRT provided patient with stress management handouts. LRT encouraged patient to read over and  practice the stress management techniques. Intervention Used: Stress Management handouts  Leonette Monarch, LRT/CTRS 01.12.17 2:03 pm
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
## 2015-05-29 NOTE — Progress Notes (Addendum)
  [facility] Adult Case Management Discharge Plan :  Will you be returning to the same living situation after discharge:  Yes,  pt will be returning home to her apartment At discharge, do you have transportation home?: Yes,  pt will be picked up by her sister Do you have the ability to pay for your medications: Yes,  pt will be provided with prescriptions at discharge  Release of information consent forms completed and in the chart;  Patient's signature needed at discharge.  Patient to Follow up at: Follow-up Information    Please follow up.   Contact information:    ACT Team:       Follow up with [facility].   Why:  Please call Thayer Headings to confirm her arriving for your appointment, at your home on January 17th at 2pm that you have scheduled for an intake and assessment upon discharge, for medication managment and therapy   Contact information:   [place], [place] [address] Ph: [phone] Fax: [phone]      Next level of care provider has access to [facility] and Suicide Prevention discussed: Yes,  with pt  Have you used any form of tobacco in the last 30 days? (Cigarettes, Smokeless Tobacco, Cigars, and/or Pipes): Patient Refused Screening  Has patient been referred to the Quitline?: Patient refused referral  Patient has been referred for addiction treatment: Pt. refused referral  Helen Reid 05/29/2015, 12:10 PM

## 2015-05-29 NOTE — Progress Notes (Signed)
Recreation Therapy Notes  INPATIENT RECREATION TR PLAN  Patient Details Name: Temica Righetti MRN: [id number] DOB: January 06, 1961 Today's Date: 05/29/2015  Rec Therapy Plan Is patient appropriate for Therapeutic Recreation?: Yes Treatment times per week: At least once a week TR Treatment/Interventions: 1:1 session, Group participation (Comment) (Appropriate participation in daily recreation therapy tx)  Discharge Criteria Pt will be discharged from therapy if:: Treatment goals are met, Discharged Treatment plan/goals/alternatives discussed and agreed upon by:: Patient/family  Discharge Summary Short term goals set: See Care Plan Short term goals met: Adequate for discharge, Complete Progress toward goals comments: One-to-one attended One-to-one attended: Self-esteem, anger management, stress management Reason goals not met: Patient refused Therapeutic equipment acquired: None Reason patient discharged from therapy: Discharge from hospital Pt/family agrees with progress & goals achieved: Yes Date patient discharged from therapy: 05/29/15   Leonette Monarch, LRT/CTRS 05/29/2015, 4:50 PM

## 2015-05-29 NOTE — BHH Group Notes (Signed)
[place] Group Notes:  (Nursing/MHT/Case Management/Adjunct)  Date:  05/29/2015  Time:  2:14 PM  Type of Therapy:  Psychoeducational Skills  Participation Level:  Active  Participation Quality:  Monopolizing and Redirectable  Affect:  Excited  Cognitive:  Appropriate  Insight:  Appropriate  Engagement in Group:  Distracting and Engaged  Modes of Intervention:  Discussion and Education  Summary of Progress/Problems:  Drake Leach 05/29/2015, 2:14 PM
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
## 2015-05-29 NOTE — [facility] Group Notes (Signed)
[facility] Group Notes:  (Nursing/MHT/Case Management/Adjunct)  Date:  05/29/2015  Time:  9:05 AM  Type of Therapy:  Community Meeting   Participation Level:  Active  Participation Quality:  Appropriate and Sharing  Affect:  Appropriate  Cognitive:  Alert, Appropriate and Oriented  Insight:  Appropriate  Engagement in Group:  Engaged  Modes of Intervention:  Discussion  Summary of Progress/Problems:  Helen Reid [name] 05/29/2015, 9:05 AM
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
## 2015-05-30 ENCOUNTER — Ambulatory Visit: Payer: Medicaid Other | Admitting: Family Medicine

## 2015-07-07 ENCOUNTER — Encounter: Payer: Self-pay | Admitting: Emergency Medicine

## 2015-07-07 ENCOUNTER — Emergency Department
Admission: EM | Admit: 2015-07-07 | Discharge: 2015-07-07 | Disposition: A | Discharge status: Home / Self Care | Payer: Medicaid Other | Attending: Emergency Medicine | Admitting: Emergency Medicine

## 2015-07-07 DIAGNOSIS — Z79899 Other long term (current) drug therapy: Secondary | ICD-10-CM | POA: Diagnosis not present

## 2015-07-07 DIAGNOSIS — N182 Chronic kidney disease, stage 2 (mild): Secondary | ICD-10-CM | POA: Diagnosis not present

## 2015-07-07 DIAGNOSIS — Z880 Allergy status to penicillin: Secondary | ICD-10-CM | POA: Insufficient documentation

## 2015-07-07 DIAGNOSIS — F603 Borderline personality disorder: Secondary | ICD-10-CM | POA: Diagnosis present

## 2015-07-07 DIAGNOSIS — F17210 Nicotine dependence, cigarettes, uncomplicated: Secondary | ICD-10-CM | POA: Insufficient documentation

## 2015-07-07 DIAGNOSIS — F1220 Cannabis dependence, uncomplicated: Secondary | ICD-10-CM | POA: Diagnosis present

## 2015-07-07 DIAGNOSIS — E039 Hypothyroidism, unspecified: Secondary | ICD-10-CM | POA: Diagnosis present

## 2015-07-07 DIAGNOSIS — F4310 Post-traumatic stress disorder, unspecified: Secondary | ICD-10-CM | POA: Diagnosis not present

## 2015-07-07 DIAGNOSIS — I129 Hypertensive chronic kidney disease with stage 1 through stage 4 chronic kidney disease, or unspecified chronic kidney disease: Secondary | ICD-10-CM | POA: Diagnosis not present

## 2015-07-07 DIAGNOSIS — R45850 Homicidal ideations: Secondary | ICD-10-CM | POA: Diagnosis present

## 2015-07-07 LAB — SALICYLATE LEVEL: Salicylate Lvl: <4 mg/dL (ref 2.8–30.0)

## 2015-07-07 LAB — COMPREHENSIVE METABOLIC PANEL
ALBUMIN: 4.1 g/dL (ref 3.5–5.0)
ALT: 20 U/L (ref 14–54)
AST: 21 U/L (ref 15–41)
Alkaline Phosphatase: 50 U/L (ref 38–126)
Anion gap: 5 (ref 5–15)
BILIRUBIN TOTAL: 0.7 mg/dL (ref 0.3–1.2)
BUN: 19 mg/dL (ref 6–20)
CALCIUM: 9.4 mg/dL (ref 8.9–10.3)
CO2: 26 mmol/L (ref 22–32)
Chloride: 108 mmol/L (ref 101–111)
Creatinine, Ser: 0.89 mg/dL (ref 0.44–1.00)
GFR calc Af Amer: >60 mL/min (ref >60)
GFR calc non Af Amer: >60 mL/min (ref >60)
GLUCOSE: 104 mg/dL — AB (ref 65–99)
Potassium: 3.7 mmol/L (ref 3.5–5.1)
Sodium: 139 mmol/L (ref 135–145)
TOTAL PROTEIN: 7.3 g/dL (ref 6.5–8.1)

## 2015-07-07 LAB — ETHANOL: Alcohol, Ethyl (B): <5 mg/dL (ref <5)

## 2015-07-07 LAB — CBC
HCT: 36.9 % (ref 35.0–47.0)
HEMOGLOBIN: 11.7 g/dL — AB (ref 12.0–16.0)
MCH: 23.1 pg — ABNORMAL LOW (ref 26.0–34.0)
MCHC: 31.7 g/dL — ABNORMAL LOW (ref 32.0–36.0)
MCV: 72.8 fL — ABNORMAL LOW (ref 80.0–100.0)
PLATELETS: 182 10*3/uL (ref 150–440)
RBC: 5.08 MIL/uL (ref 3.80–5.20)
RDW: 14.4 % (ref 11.5–14.5)
WBC: 4.6 10*3/uL (ref 3.6–11.0)

## 2015-07-07 LAB — ACETAMINOPHEN LEVEL: Acetaminophen (Tylenol), Serum: <10 ug/mL — ABNORMAL LOW (ref 10–30)

## 2015-07-07 MED ORDER — LUBIPROSTONE 24 MCG PO CAPS
24.0000 ug | ORAL_CAPSULE | Freq: Two times a day (BID) | ORAL | Status: DC
  Filled 2015-07-07: qty 1

## 2015-07-07 MED ORDER — PRAZOSIN HCL 1 MG PO CAPS: 1.0000 mg | ORAL_CAPSULE | Freq: Every day | ORAL | Status: DC

## 2015-07-07 MED ORDER — PRAZOSIN HCL 1 MG PO CAPS
1.0000 mg | ORAL_CAPSULE | Freq: Every day | ORAL | Status: DC
  Filled 2015-07-07: qty 1

## 2015-07-07 MED ORDER — LEVOTHYROXINE SODIUM 200 MCG PO TABS: 200.0000 ug | ORAL_TABLET | Freq: Every day | ORAL | Status: DC

## 2015-07-07 MED ORDER — LEVOTHYROXINE SODIUM 200 MCG PO TABS
200.0000 ug | ORAL_TABLET | Freq: Every day | ORAL | Status: DC
  Filled 2015-07-07: qty 1

## 2015-07-07 MED ORDER — DIPHENHYDRAMINE HCL 25 MG PO CAPS: 25.0000 mg | ORAL_CAPSULE | Freq: Every evening | ORAL | Status: DC | PRN

## 2015-07-07 MED ORDER — LUBIPROSTONE 24 MCG PO CAPS: 24.0000 ug | ORAL_CAPSULE | Freq: Two times a day (BID) | ORAL | Status: DC

## 2015-07-07 NOTE — ED Notes (Signed)
Pt unable to obtain ride.

## 2015-07-07 NOTE — Discharge Instructions (Signed)
Posttraumatic Stress Disorder Posttraumatic stress disorder (PTSD) is a mental disorder. It occurs after a traumatic event in your life. The traumatic events that cause PTSD are outside the range of normal human experience. Examples of these events include war, automobile accidents, natural disasters, rape, domestic violence, and violent crimes. Most people who experience these types of events are able to heal on their own. Those who do not heal develop PTSD. PTSD can happen to anyone at any age. However, people with a history of childhood abuse are at increased risk for developing PTSD.  SYMPTOMS  The traumatic event that causes PTSD must be a threat to life, cause serious injury, or involve sexual violence. The traumatic event is usually experienced directly by the person who develops PTSD. Sometimes PTSD occurs in people who witness traumas that occur to others or who hear about a trauma that occurs to a close family member or friend. The following behaviors are characteristic of people with PTSD:  People with PTSD re-experience the traumatic event in one or more of the following ways (intrusion symptoms):  Recurrent, unwanted distressing memories while awake.  Recurrent distressing dreams.  Sensations similar to those felt when the event originally occurred (flashbacks).   Intense or prolonged emotional distress, triggered by reminders of the trauma. This may include fear, horror, intense sadness, or anger.  Marked physical reactions, triggered by reminders of the trauma. This may include racing heart, shortness of breath, sweating, and shaking.  People with PTSD avoid thoughts, conversations, people, or activities that remind them of the traumatic event (avoidance symptoms).  People with PTSD have negative changes in their thinking and mood after the traumatic event. These changes include:  Inability to remember one or more significant aspects of the traumatic event (memory  gaps).  Exaggerated negative perceptions about themselves or others, such as believing that they are bad people or that no one can be trusted.  Unrealistic assignment of blame to themselves or others for the traumatic event.  Persistent negative emotional state, such as fear, horror, anger, sadness, guilt, or shame.  Markedly decreased interest or participation in significant activities.  A loss of connection with other people.  Inability to experience positive emotions, such as happiness or love.  People with PTSD are more sensitive to their environment and react more easily than others (hyperarousal-overreactivity symptoms). These symptoms include:  Irritability, with angry outbursts toward other people or objects. The outbursts are easily triggered and may be verbal or physical.  Careless or self-destructive behavior. This may include reckless driving or drug use.  A feeling of being on edge, with increased alertness (hypervigilance).  Exaggerated reactions to stimuli, such as being easily startled.   Difficulty concentrating.  Difficulty sleeping. PTSD symptoms may start soon after a frightening event or months or years later. They last at least 1 month or longer and can affect one or more areas of functioning, such as social or occupational functioning.  DIAGNOSIS  PTSD is diagnosed through an assessment by a mental health professional. You will be asked questions about the traumatic events in your life. You will also be asked about how these events have changed your thoughts, mood, behavior, and ability to function on a daily basis. You may be asked about your use of alcohol or drugs, which can make PTSD symptoms worse. TREATMENT  Unlike many mental disorders, which require lifelong management, PTSD is a curable condition. The goal of PTSD treatment is to neutralize the negative effects of the traumatic event on daily   functioning, not erase the memory of the event. The  following treatments may be prescribed to reach this goal:  Medicines. Certain medicines can reduce some PTSD symptoms. Intrusion symptoms and hyperarousal-overactivity symptoms respond best to medicines.  Counseling (talk therapy). Talk therapy with a mental health professional who is experienced in treating PTSD can help. Talk therapy can provide education, emotional support, and coping skills. Certain types of talk therapy that specifically target the traumatic events are the most effective treatment for PTSD:  Prolonged exposure therapy, which involves remembering and processing the traumatic event with a therapist in a safe environment until it no longer creates a negative emotional response.  Eye movement desensitization and reprocessing therapy, which involves the use of repetitive physical stimulation of the senses that alternates between the right and left sides of the body. It is believed that this therapy facilitates communication between the two sides of the brain. This communication helps the mind to integrate the fragmented memories of the traumatic event into a whole story that makes sense and no longer creates a negative emotional response. Most people with PTSD benefit from a combination of these treatments.    This information is not intended to replace advice given to you by your health care provider. Make sure you discuss any questions you have with your health care provider.   Document Released: 01/26/2001 Document Revised: 05/24/2014 Document Reviewed: 07/20/2012 Elsevier Interactive Patient Education [employer].   Please resume your medications as directed. Please continue to follow-up with your therapist return as needed

## 2015-07-07 NOTE — ED Notes (Signed)
Patient's belongings secured.   [employer] stud earrings, 2 clip earring, 5 rings, 7 bracelets, 1 watch Clothing  Belongings placed in patient belonging bag and witnessed by Linus Orn EDT and Probation officer.
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
## 2015-07-07 NOTE — ED Notes (Signed)
Patient seen at [facility] today and placed under IVC for homicidal ideation with intent to kill.
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
## 2015-08-21 ENCOUNTER — Encounter: Payer: Self-pay | Admitting: Family Medicine

## 2015-08-21 ENCOUNTER — Ambulatory Visit (INDEPENDENT_AMBULATORY_CARE_PROVIDER_SITE_OTHER): Payer: Medicaid Other | Admitting: Family Medicine

## 2015-08-21 VITALS — BP 132/74 | HR 60 | Temp 98.1°F | Resp 16 | Ht 65.0 in | Wt 140.6 lb

## 2015-08-21 DIAGNOSIS — E038 Other specified hypothyroidism: Secondary | ICD-10-CM | POA: Diagnosis not present

## 2015-08-21 DIAGNOSIS — E034 Atrophy of thyroid (acquired): Secondary | ICD-10-CM | POA: Diagnosis not present

## 2015-08-21 DIAGNOSIS — M2550 Pain in unspecified joint: Secondary | ICD-10-CM

## 2015-08-21 DIAGNOSIS — F17200 Nicotine dependence, unspecified, uncomplicated: Secondary | ICD-10-CM

## 2015-08-21 DIAGNOSIS — D649 Anemia, unspecified: Secondary | ICD-10-CM | POA: Diagnosis not present

## 2015-08-21 DIAGNOSIS — N182 Chronic kidney disease, stage 2 (mild): Secondary | ICD-10-CM | POA: Diagnosis not present

## 2015-08-21 DIAGNOSIS — L309 Dermatitis, unspecified: Secondary | ICD-10-CM

## 2015-08-21 MED ORDER — PRAZOSIN HCL 1 MG PO CAPS: 1.0000 mg | ORAL_CAPSULE | Freq: Every day | ORAL | Status: DC

## 2015-08-21 MED ORDER — LEVOTHYROXINE SODIUM 100 MCG PO TABS: 100.0000 ug | ORAL_TABLET | Freq: Every day | ORAL | Status: DC

## 2015-08-21 MED ORDER — LIDOCAINE 5 % EX OINT: 1.0000 "application " | TOPICAL_OINTMENT | Freq: Two times a day (BID) | CUTANEOUS | Status: DC | PRN

## 2015-08-21 MED ORDER — LEVOTHYROXINE SODIUM 200 MCG PO TABS: 200.0000 ug | ORAL_TABLET | Freq: Every day | ORAL | Status: DC

## 2015-08-21 MED ORDER — DIPHENHYDRAMINE HCL 25 MG PO CAPS: 25.0000 mg | ORAL_CAPSULE | Freq: Every evening | ORAL | Status: DC | PRN

## 2015-08-21 MED ORDER — ETODOLAC 200 MG PO CAPS: 400.0000 mg | ORAL_CAPSULE | Freq: Two times a day (BID) | ORAL | Status: DC | PRN

## 2015-08-21 NOTE — Progress Notes (Signed)
BP 132/74 mmHg  Pulse 60  Temp(Src) 98.1 F (36.7 C) (Oral)  Resp 16  Ht 5\' 5"  (1.651 m)  Wt 140 lb 9.6 oz (63.776 kg)  BMI 23.40 kg/m2  SpO2 96%   Subjective:    Patient ID: Helen Reid, female    DOB: 26-Jan-1961, 55 y.o.   MRN: [id number]  HPI: Helen Reid is a 55 y.o. female  Chief Complaint  Patient presents with  . Medication Refill    levothyroxine 200 mcg / prazosin 1 mg / diphenhydramine 25 mg    Patient is new to me; her regular primary has left this practice  She has a rash on the side of the neck; been there for a year; saw dermatology for this; getting worse; the dermatologist left and she hasn't heard about any f/u  She needs medication for her thyroid; has had issues with her thyroid for years and years; stable dose; lots of energy; checked last TSH, last was very high; last dose was weeks ago though, as she says she has been out of medicine  She has joint pain from OA and fibromyalgia; uses lidocaine and mixes that cream in with aloe vera gel and olive oil or coconut oil and puts on her hands; also uses cayenne pepper topically  Anemic; sickle trait; no rectal bleeding lately, just every once in a while; aloe vera for that too; I do not see a colonoscopy in her health maintenance tab  Relevant past medical, surgical, family and social history reviewed and updated as indicated. Interim medical history since last visit reviewed. Allergies and medications reviewed  Review of Systems Per HPI unless specifically indicated above     Objective:    BP 132/74 mmHg  Pulse 60  Temp(Src) 98.1 F (36.7 C) (Oral)  Resp 16  Ht 5\' 5"  (1.651 m)  Wt 140 lb 9.6 oz (63.776 kg)  BMI 23.40 kg/m2  SpO2 96%  Wt Readings from Last 3 Encounters:  09/02/15 143 lb (64.864 kg)  08/21/15 140 lb 9.6 oz (63.776 kg)  07/07/15 148 lb (67.132 kg)    Physical Exam  Constitutional: She appears well-developed and well-nourished.  HENT:  Head: Normocephalic and atraumatic.    Eyes: EOM are normal. No scleral icterus.  Cardiovascular: Normal rate and regular rhythm.   Pulmonary/Chest: Effort normal and breath sounds normal.  Neurological: She is alert.  Psychiatric: Her mood appears not anxious. She is not slowed and not withdrawn. Cognition and memory are not impaired. She does not exhibit a depressed mood.  Talkative but redirectable; good eye contact She is attentive.      Assessment & Plan:   Problem List Items Addressed This Visit      Endocrine   Adult hypothyroidism - Primary    S/p radiation; need to check TSH and adjust thyroid if needed; will keep in mind she has been out of medicine      Relevant Orders   TSH (Completed)     Musculoskeletal and Integument   Dermatitis    Refer back to dermatologist      Relevant Orders   Ambulatory referral to Dermatology     Genitourinary   Chronic kidney disease (CKD), stage II (mild)    Check creatinine; improved last check      Relevant Orders   Basic metabolic panel (Completed)     Other   Arthralgia of multiple joints    Suggested turmeric, topicals; I am not going to prescribe narcotics for this patient  Tobacco use disorder    I am here to help her quit if/when she is ready      Anemia    Patient does not want a colonoscopy but is open to having cologuard testing      Relevant Orders   Cologuard (Completed)   Ferritin (Completed)   Iron Binding Cap (TIBC) (Completed)   Pain, joint, multiple sites   Relevant Orders   VITAMIN D 25 Hydroxy (Vit-D Deficiency, Fractures) (Completed)      Follow up plan: Return in about 1 month (around 09/20/2015) for follow-up.  An after-visit summary was printed and given to the patient at [place].  Please see the patient instructions which may contain other information and recommendations beyond what is mentioned above in the assessment and plan.  Orders Placed This Encounter  Procedures  . Cologuard  . TSH  . Basic metabolic panel   . Ferritin  . Iron Binding Cap (TIBC)  . VITAMIN D 25 Hydroxy (Vit-D Deficiency, Fractures)  . Ambulatory referral to Dermatology

## 2015-08-21 NOTE — Patient Instructions (Addendum)
Try turmeric as a natural anti-inflammatory (for pain and arthritis). It comes in capsules where you buy aspirin and fish oil, but also as a spice where you buy pepper and garlic powder. Use the topical lidocaine on your hands as needed We'll get your labs today If you have not heard anything from my staff in a week about any orders/referrals/studies from today, please contact us here to follow-up [phone] Return in one month for follow-up I've ordered cologuard testing to screen for colon cancer

## 2015-08-21 NOTE — Assessment & Plan Note (Addendum)
S/p radiation; need to check TSH and adjust thyroid if needed; will keep in mind she has been out of medicine

## 2015-08-21 NOTE — Assessment & Plan Note (Signed)
Refer back to dermatologist 

## 2015-08-21 NOTE — Assessment & Plan Note (Signed)
Check creatinine; improved last check

## 2015-08-22 LAB — VITAMIN D 25 HYDROXY (VIT D DEFICIENCY, FRACTURES): Vit D, 25-Hydroxy: 21 ng/mL — ABNORMAL LOW (ref 30.0–100.0)

## 2015-08-22 LAB — BASIC METABOLIC PANEL
BUN / CREAT RATIO: 10 (ref 9–23)
BUN: 13 mg/dL (ref 6–24)
CHLORIDE: 100 mmol/L (ref 96–106)
CO2: 24 mmol/L (ref 18–29)
Calcium: 9.9 mg/dL (ref 8.7–10.2)
Creatinine, Ser: 1.25 mg/dL — ABNORMAL HIGH (ref 0.57–1.00)
GFR calc non Af Amer: 48 mL/min/{1.73_m2} — ABNORMAL LOW (ref >59)
GFR, EST AFRICAN AMERICAN: 56 mL/min/{1.73_m2} — AB (ref >59)
GLUCOSE: 90 mg/dL (ref 65–99)
POTASSIUM: 4.4 mmol/L (ref 3.5–5.2)
SODIUM: 140 mmol/L (ref 134–144)

## 2015-08-22 LAB — IRON AND TIBC
IRON: 111 ug/dL (ref 27–159)
Iron Saturation: 33 % (ref 15–55)
Total Iron Binding Capacity: 337 ug/dL (ref 250–450)
UIBC: 226 ug/dL (ref 131–425)

## 2015-08-22 LAB — TSH: TSH: 75.43 u[IU]/mL — AB (ref 0.450–4.500)

## 2015-08-22 LAB — FERRITIN: FERRITIN: 120 ng/mL (ref 15–150)

## 2015-08-25 ENCOUNTER — Telehealth: Payer: Self-pay | Admitting: Family Medicine

## 2015-08-25 NOTE — Telephone Encounter (Signed)
Lab results Abnormal TSH Low vitamin D Renal insuffiency

## 2015-08-29 ENCOUNTER — Other Ambulatory Visit: Payer: Self-pay | Admitting: Family Medicine

## 2015-08-29 MED ORDER — VITAMIN D (ERGOCALCIFEROL) 1.25 MG (50000 UNIT) PO CAPS: 50000.0000 [IU] | ORAL_CAPSULE | ORAL | Status: DC

## 2015-08-29 MED ORDER — DICLOFENAC SODIUM 1 % TD GEL: TRANSDERMAL | Status: DC

## 2015-08-29 MED ORDER — LEVOTHYROXINE SODIUM 200 MCG PO TABS: ORAL_TABLET | ORAL | Status: DC

## 2015-08-29 NOTE — Telephone Encounter (Signed)
I called pt She says the pharmacy did not give her the lower dose 100 mcg thyroid medicine; just the 200 mcg; I cautioned her to take just half of a thyroid pill daily for 2 weeks, then go to one whole pill  Stop etodolac altogether; renal function not as good as before; stay hydrated I never got a message about her sister calling; pt says sister called, they scheduled her for an appt 4/18, but I was not aware she was having leg pain, falling She says leg swollen, right knee, in the joint feet and toes, fluid, sharp pains; advised if possibility of blood clot, go now to the ER [place] twice Vit D low, start Rx I'll see her next week

## 2015-08-29 NOTE — Telephone Encounter (Signed)
I called, but it sounded like I woke her up; she said she had been resting; I'll call her back in a few hours

## 2015-09-02 ENCOUNTER — Ambulatory Visit (INDEPENDENT_AMBULATORY_CARE_PROVIDER_SITE_OTHER): Payer: Medicaid Other | Admitting: Family Medicine

## 2015-09-02 ENCOUNTER — Encounter: Payer: Self-pay | Admitting: Family Medicine

## 2015-09-02 ENCOUNTER — Ambulatory Visit
Admission: RE | Admit: 2015-09-02 | Discharge: 2015-09-02 | Disposition: A | Discharge status: Home / Self Care | Payer: Medicaid Other | Source: Ambulatory Visit | Attending: Family Medicine | Admitting: Family Medicine

## 2015-09-02 VITALS — BP 130/68 | HR 48 | Temp 98.0°F | Resp 14 | Ht 65.0 in | Wt 143.0 lb

## 2015-09-02 DIAGNOSIS — Z23 Encounter for immunization: Secondary | ICD-10-CM | POA: Diagnosis not present

## 2015-09-02 DIAGNOSIS — R001 Bradycardia, unspecified: Secondary | ICD-10-CM | POA: Diagnosis not present

## 2015-09-02 DIAGNOSIS — M79604 Pain in right leg: Secondary | ICD-10-CM

## 2015-09-02 DIAGNOSIS — E034 Atrophy of thyroid (acquired): Secondary | ICD-10-CM

## 2015-09-02 DIAGNOSIS — F603 Borderline personality disorder: Secondary | ICD-10-CM | POA: Diagnosis not present

## 2015-09-02 DIAGNOSIS — M79661 Pain in right lower leg: Secondary | ICD-10-CM

## 2015-09-02 DIAGNOSIS — S91332A Puncture wound without foreign body, left foot, initial encounter: Secondary | ICD-10-CM | POA: Diagnosis not present

## 2015-09-02 DIAGNOSIS — M7989 Other specified soft tissue disorders: Secondary | ICD-10-CM | POA: Insufficient documentation

## 2015-09-02 DIAGNOSIS — E038 Other specified hypothyroidism: Secondary | ICD-10-CM | POA: Diagnosis not present

## 2015-09-02 DIAGNOSIS — E559 Vitamin D deficiency, unspecified: Secondary | ICD-10-CM | POA: Diagnosis not present

## 2015-09-02 DIAGNOSIS — F4310 Post-traumatic stress disorder, unspecified: Secondary | ICD-10-CM

## 2015-09-02 NOTE — Assessment & Plan Note (Signed)
With dissociative identity disorder; seeing therapist, but no psychiatrist; [place]

## 2015-09-02 NOTE — Patient Instructions (Addendum)
I do encourage you to quit smoking Call [phone] to sign up for smoking cessation classes You can call 1-800-QUIT-NOW to talk with a smoking cessation coach  1 Peter 4:10 "Each of you should use whatever gift you have received to serve others, as faithful stewards of God's grace in its various forms."  Go from here to the [place] entrance and they will scan your leg  You received the vaccine to protect against tetanus and diphtheria and pertussis today; the tetanus and diphtheria portions will provide protection up to ten years, and the pertussis component will give you protection against whooping cough for life
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
## 2015-09-02 NOTE — Assessment & Plan Note (Signed)
Explained that this may be from her thyroid; reviewed old notes from office and ER; one pulse in ER was 50; hopefully this will improve as her TSH comes down

## 2015-09-02 NOTE — Assessment & Plan Note (Signed)
Given today.

## 2015-09-07 LAB — COLOGUARD: COLOGUARD: POSITIVE

## 2015-09-09 ENCOUNTER — Telehealth: Payer: Self-pay

## 2015-09-09 ENCOUNTER — Telehealth: Payer: Self-pay | Admitting: Family Medicine

## 2015-09-09 DIAGNOSIS — K639 Disease of intestine, unspecified: Secondary | ICD-10-CM | POA: Insufficient documentation

## 2015-09-09 NOTE — Telephone Encounter (Signed)
Patient's Cologuard test was positive; she will need to see gen surg for colonoscopy, follow-up I tried to reach patient; was unable Referral entered ---------------------- Please call pt Let her know we really appreciate her returning the screening stool test It was positive for abnormal cells, so we'd like her to see a specialist to do a colonoscopy

## 2015-09-09 NOTE — Telephone Encounter (Signed)
Pt called about having a nurse come out to her house?  I think she was needing referral to home health?

## 2015-09-09 NOTE — Telephone Encounter (Signed)
Pt.notified

## 2015-09-10 ENCOUNTER — Other Ambulatory Visit: Payer: Self-pay | Admitting: Family Medicine

## 2015-09-10 DIAGNOSIS — K639 Disease of intestine, unspecified: Secondary | ICD-10-CM

## 2015-09-10 NOTE — Assessment & Plan Note (Signed)
Refer to surg 

## 2015-09-10 NOTE — Telephone Encounter (Signed)
Ok I will call her thats what she was here for at her last visit a face to face for home health is what they require? Patient states she needs a nurse for daily activities such a showering and dressing she states she is constant pain and needs help?

## 2015-09-10 NOTE — Telephone Encounter (Signed)
I don't recall [place] talking about this What is her specific need? What is she hoping that home health will be able to do for her? We just need to document for the medical record what her difficulty or symptoms are to best help her Also, if she does qualify, is there an agency she uses or would like to use? Thank you

## 2015-09-13 NOTE — Assessment & Plan Note (Signed)
I am here to help her quit if/when she is ready

## 2015-09-13 NOTE — Assessment & Plan Note (Signed)
Patient does not want a colonoscopy but is open to having cologuard testing

## 2015-09-13 NOTE — Assessment & Plan Note (Signed)
Suggested turmeric, topicals; I am not going to prescribe narcotics for this patient

## 2015-09-15 ENCOUNTER — Ambulatory Visit: Payer: Self-pay | Admitting: General Surgery

## 2015-09-16 ENCOUNTER — Encounter: Payer: Self-pay | Admitting: *Deleted

## 2015-09-16 ENCOUNTER — Other Ambulatory Visit: Payer: Self-pay | Admitting: Family Medicine

## 2015-09-16 NOTE — Telephone Encounter (Signed)
Pt would like a call back from Dr Sanda Klein.

## 2015-09-16 NOTE — Telephone Encounter (Signed)
Pt called back states she missed her appt with [facility] yesterday due to medicaid ride issues.  They are supposed to call her back in 2 days to get her reestablished with a ride and then she will call and reschedule the appt.  Also states she needs new rx for vitamin d cannot afford otc

## 2015-09-17 MED ORDER — VITAMIN D (ERGOCALCIFEROL) 1.25 MG (50000 UNIT) PO CAPS: 50000.0000 [IU] | ORAL_CAPSULE | ORAL | Status: DC

## 2015-09-17 NOTE — Telephone Encounter (Signed)
She was only to take it once a week short-term; I sent that already I'll just have her take 50k once a month at this point

## 2015-09-22 ENCOUNTER — Ambulatory Visit (INDEPENDENT_AMBULATORY_CARE_PROVIDER_SITE_OTHER): Payer: Medicaid Other | Admitting: Family Medicine

## 2015-09-22 ENCOUNTER — Encounter: Payer: Self-pay | Admitting: Family Medicine

## 2015-09-22 VITALS — BP 138/84 | HR 46 | Temp 97.8°F | Resp 10 | Ht 68.0 in | Wt 141.2 lb

## 2015-09-22 DIAGNOSIS — Z1159 Encounter for screening for other viral diseases: Secondary | ICD-10-CM | POA: Diagnosis not present

## 2015-09-22 DIAGNOSIS — R296 Repeated falls: Secondary | ICD-10-CM | POA: Diagnosis not present

## 2015-09-22 DIAGNOSIS — R001 Bradycardia, unspecified: Secondary | ICD-10-CM | POA: Diagnosis not present

## 2015-09-22 DIAGNOSIS — E034 Atrophy of thyroid (acquired): Secondary | ICD-10-CM

## 2015-09-22 DIAGNOSIS — E038 Other specified hypothyroidism: Secondary | ICD-10-CM

## 2015-09-22 DIAGNOSIS — M25561 Pain in right knee: Secondary | ICD-10-CM

## 2015-09-22 NOTE — Telephone Encounter (Signed)
Patient came in to see Dr. Ancil Boozer today and she states does not qualify for home health
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
## 2015-09-27 ENCOUNTER — Encounter: Payer: Self-pay | Admitting: Family Medicine

## 2015-09-27 DIAGNOSIS — E559 Vitamin D deficiency, unspecified: Secondary | ICD-10-CM

## 2015-09-27 HISTORY — DX: Vitamin D deficiency, unspecified: E55.9

## 2015-09-27 NOTE — Assessment & Plan Note (Signed)
Supplementation; may explain some of her fatigue, in addition to her thyroid

## 2015-09-27 NOTE — Assessment & Plan Note (Signed)
Noted; question other personality d/o

## 2015-09-27 NOTE — Assessment & Plan Note (Addendum)
With bradycardia; noncompliance with medicine; doing better with lower strength and we'll titrate back up

## 2015-09-27 NOTE — Assessment & Plan Note (Signed)
Stat lower extremity venous duplex ordered; I personally drove patient to the hospital to have this test done, as she does not drive and her pre-arranged transportation would not take her there; r/o DVT; she cannot take aspirin

## 2015-09-29 ENCOUNTER — Ambulatory Visit: Payer: Self-pay | Admitting: General Surgery

## 2015-09-30 ENCOUNTER — Encounter: Payer: Self-pay | Admitting: Cardiology

## 2015-09-30 ENCOUNTER — Ambulatory Visit (INDEPENDENT_AMBULATORY_CARE_PROVIDER_SITE_OTHER): Payer: Medicaid Other | Admitting: Cardiology

## 2015-09-30 VITALS — BP 120/80 | HR 53 | Ht 67.0 in | Wt 141.4 lb

## 2015-09-30 DIAGNOSIS — Z7189 Other specified counseling: Secondary | ICD-10-CM | POA: Diagnosis not present

## 2015-09-30 DIAGNOSIS — R079 Chest pain, unspecified: Secondary | ICD-10-CM | POA: Diagnosis not present

## 2015-09-30 DIAGNOSIS — Z7689 Persons encountering health services in other specified circumstances: Secondary | ICD-10-CM

## 2015-09-30 NOTE — Patient Instructions (Addendum)
Medication Instructions:  Your physician recommends that you continue on your current medications as directed. Please refer to the Current Medication list given to you today.   Labwork: None ordered  Testing/Procedures: Your physician has requested that you have an echocardiogram. Echocardiography is a painless test that uses sound waves to create images of your heart. It provides your doctor with information about the size and shape of your heart and how well your heart's chambers and valves are working. This procedure takes approximately one hour. There are no restrictions for this procedure.  Date & Time: __________________________________________________________________________  [facility] Stress Test  Your caregiver has ordered a Stress Test with nuclear imaging. The purpose of this test is to evaluate the blood supply to your heart muscle. Cardiac stress tests are done to find areas of poor blood flow to the heart by determining the extent of coronary artery disease (CAD).   Please note: these test may take anywhere between 2-4 hours to complete  PLEASE REPORT TO [facility] AT THE FIRST DESK WILL DIRECT YOU WHERE TO GO  Date of Procedure:____Wednesday Oct 08, 2015 at 08:00AM_____  Arrival Time for Procedure:___Arrive at 07:45AM to register__________    PLEASE NOTIFY THE OFFICE AT LEAST 24 HOURS IN ADVANCE IF YOU ARE UNABLE TO [place].  [phone] AND  PLEASE NOTIFY NUCLEAR MEDICINE AT [facility] AT LEAST 24 HOURS IN ADVANCE IF YOU ARE UNABLE TO KEEP YOUR APPOINTMENT. [phone]  How to prepare for your Myoview test:   Do not eat or drink after midnight  No caffeine for 24 hours prior to test  No smoking 24 hours prior to test.  Your medication may be taken with water.  If your doctor stopped a medication because of this test, do not take that medication.  Ladies, please do not wear dresses.  Skirts or pants are appropriate. Please  wear a short sleeve shirt.  No perfume, cologne or lotion.  Wear comfortable walking shoes. No heels!     Follow-Up: Your physician recommends that you schedule a follow-up appointment after testing to review results.  Date & Time: ____________________________________________________________________   Any Other Special Instructions Will Be Listed Below (If Applicable).     If you need a refill on your cardiac medications before your next appointment, please call your pharmacy. Echocardiogram An echocardiogram, or echocardiography, uses sound waves (ultrasound) to produce an image of your heart. The echocardiogram is simple, painless, obtained within a short period of time, and offers valuable information to your health care provider. The images from an echocardiogram can provide information such as:  Evidence of coronary artery disease (CAD).  Heart size.  Heart muscle function.  Heart valve function.  Aneurysm detection.  Evidence of a past heart attack.  Fluid buildup around the heart.  Heart muscle thickening.  Assess heart valve function. LET [facility] CARE PROVIDER KNOW ABOUT:  Any allergies you have.  All medicines you are taking, including vitamins, herbs, eye drops, creams, and over-the-counter medicines.  Previous problems you or members of your family have had with the use of anesthetics.  Any blood disorders you have.  Previous surgeries you have had.  Medical conditions you have.  Possibility of pregnancy, if this applies. BEFORE THE PROCEDURE  No special preparation is needed. Eat and drink normally.  PROCEDURE   In order to produce an image of your heart, gel will be applied to your chest and a wand-like tool (transducer) will be moved over your  chest. The gel will help transmit the sound waves from the transducer. The sound waves will harmlessly bounce off your heart to allow the heart images to be captured in real-time motion. These images  will then be recorded.  You may need an IV to receive a medicine that improves the quality of the pictures. AFTER THE PROCEDURE You may return to your normal schedule including diet, activities, and medicines, unless your health care provider tells you otherwise.   This information is not intended to replace advice given to you by your health care provider. Make sure you discuss any questions you have with your health care provider.   Document Released: 04/30/2000 Document Revised: 05/24/2014 Document Reviewed: 01/08/2013 Elsevier Interactive Patient Education 2016 [place].   Cardiac Nuclear Scanning A cardiac nuclear scan is used to check your heart for problems, such as the following:  A portion of the heart is not getting enough blood.  Part of the heart muscle has died, which happens with a heart attack.  The heart wall is not working normally.  In this test, a radioactive dye (tracer) is injected into your bloodstream. After the tracer has traveled to your heart, a scanning device is used to measure how much of the tracer is absorbed by or distributed to various areas of your heart. LET [facility] CARE PROVIDER KNOW ABOUT:  Any allergies you have.  All medicines you are taking, including vitamins, herbs, eye drops, creams, and over-the-counter medicines.  Previous problems you or members of your family have had with the use of anesthetics.  Any blood disorders you have.  Previous surgeries you have had.  Medical conditions you have.  RISKS AND COMPLICATIONS Generally, this is a safe procedure. However, as with any procedure, problems can occur. Possible problems include:   Serious chest pain.  Rapid heartbeat.  Sensation of warmth in your chest. This usually passes quickly. BEFORE THE PROCEDURE Ask your health care provider about changing or stopping your regular medicines. PROCEDURE This procedure is usually done at a hospital and takes 2-4 hours.  An IV  tube is inserted into one of your veins.  Your health care provider will inject a small amount of radioactive tracer through the tube.  You will then wait for 20-40 minutes while the tracer travels through your bloodstream.  You will lie down on an exam table so images of your heart can be taken. Images will be taken for about 15-20 minutes.  You will exercise on a treadmill or stationary bike. While you exercise, your heart activity will be monitored with an electrocardiogram (ECG), and your blood pressure will be checked.  If you are unable to exercise, you may be given a medicine to make your heart beat faster.  When blood flow to your heart has peaked, tracer will again be injected through the IV tube.  After 20-40 minutes, you will get back on the exam table and have more images taken of your heart.  When the procedure is over, your IV tube will be removed. AFTER THE PROCEDURE  You will likely be able to leave shortly after the test. Unless your health care provider tells you otherwise, you may return to your normal schedule, including diet, activities, and medicines.  Make sure you find out how and when you will get your test results.   This information is not intended to replace advice given to you by your health care provider. Make sure you discuss any questions you have with your health  care provider.   Document Released: 05/28/2004 Document Revised: 05/08/2013 Document Reviewed: 04/11/2013 Elsevier Interactive Patient Education [employer].

## 2015-09-30 NOTE — Progress Notes (Signed)
Cardiology Office Note   Date:  09/30/2015   ID:  Helen Reid, DOB 1960/11/01, MRN [id number]  Referring Doctor:  Enid Derry, MD   Cardiologist:   Wende Bushy, MD   Reason for consultation:  Chief Complaint  Patient presents with  . Establish Care    CP      History of Present Illness: Helen Reid is a 55 y.o. female who presents for Chest pain. Patient is not the best historian. She reports long history of chest pain, heaviness in the chest, moderate intensity, lasting hours sometimes days. Sometimes radiating down to the left arm. Associated with some shortness of breath. Randomly occurring.  Patient denies fever, cough, colds, abdominal pain. No PND, orthopnea, edema.  ROS:  Please see the history of present illness. Aside from mentioned under HPI, all other systems are reviewed and negative.    Past Medical History  Diagnosis Date  . Hypertension   . Anxiety   . Depression   . Cancer [facility])     lymphoma, gallbladder, breast  . Vitamin D deficiency 09/27/2015    Past Surgical History  Procedure Laterality Date  . Cholecystectomy    . Breast surgery       reports that she has been smoking Cigarettes.  She does not have any smokeless tobacco history on file. She reports that she uses illicit drugs (Marijuana). She reports that she does not drink alcohol. She started smoking at age 48. She said she started using her when at age 44.  family history includes Asthma in her mother; Heart disease in her father and mother; Hypertension in her mother.   Current Outpatient Prescriptions  Medication Sig Dispense Refill  . diclofenac sodium (VOLTAREN) 1 % GEL Use 2 grams on hand, 4 grams on knee; up to four times a day PRN pain 100 g 2  . diphenhydrAMINE (BENADRYL) 25 mg capsule Take 1 capsule (25 mg total) by mouth at bedtime as needed for sleep. 30 capsule 2  . levothyroxine (SYNTHROID, LEVOTHROID) 200 MCG tablet Half of a pill by mouth daily for two weeks  (4/14-4/28), then one whole pill daily -- no new Rx sent 08/29/15 1 tablet 0  . Vitamin D, Ergocalciferol, (DRISDOL) 50000 units CAPS capsule Take 1 capsule (50,000 Units total) by mouth every 30 (thirty) days. 1 capsule 2  . lidocaine (XYLOCAINE) 5 % ointment Apply 1 application topically 2 (two) times daily as needed. Mixed with aloe vera and/or oil, small amount topically to hands PRN (Patient not taking: Reported on 09/30/2015) 50 g 1   No current facility-administered medications for this visit.    Allergies: Aspirin; Dilantin ; Macrolides and ketolides; Phenobarbital; and Penicillins    PHYSICAL EXAM: VS:  BP 120/80 mmHg  Pulse 53  Ht 5\' 7"  (1.702 m)  Wt 141 lb 6.4 oz (64.139 kg)  BMI 22.14 kg/m2  SpO2 98% , Body mass index is 22.14 kg/(m^2). Wt Readings from Last 3 Encounters:  09/30/15 141 lb 6.4 oz (64.139 kg)  09/22/15 141 lb 3.2 oz (64.048 kg)  09/02/15 143 lb (64.864 kg)    GENERAL:  well developed, well nourished, , not in acute distress HEENT: normocephalic, pink conjunctivae, anicteric sclerae, no xanthelasma, normal dentition, oropharynx clear NECK:  no neck vein engorgement, JVP normal, no hepatojugular reflux, carotid upstroke brisk and symmetric, no bruit, no thyromegaly, no lymphadenopathy LUNGS:  good respiratory effort, clear to auscultation bilaterally CV:  PMI not displaced, no thrills, no lifts, S1 and S2 within  normal limits, no palpable S3 or S4, no murmurs, no rubs, no gallops ABD:  Soft, nontender, nondistended, normoactive bowel sounds, no abdominal aortic bruit, no hepatomegaly, no splenomegaly MS: nontender back, no kyphosis, no scoliosis, no joint deformities EXT:  2+ DP/PT pulses, no edema, no varicosities, no cyanosis, no clubbing SKIN: warm, nondiaphoretic, normal turgor, no ulcers NEUROPSYCH: alert, oriented to person, place, and time, sensory/motor grossly intact, normal mood, appropriate affect  Recent Labs: 07/07/2015: ALT 20; Hemoglobin 11.7*;  Platelets 182 08/21/2015: BUN 13; Creatinine, Ser 1.25*; Potassium 4.4; Sodium 140; TSH 75.430*   Lipid Panel    Component Value Date/Time   CHOL 233* 05/27/2015 0611   CHOL 160 12/22/2013 0610   TRIG 80 05/27/2015 0611   TRIG 76 12/22/2013 0610   HDL 63 05/27/2015 0611   HDL 57 12/22/2013 0610   CHOLHDL 3.7 05/27/2015 0611   VLDL 16 05/27/2015 0611   VLDL 15 12/22/2013 0610   LDLCALC 154* 05/27/2015 0611   LDLCALC 88 12/22/2013 0610     Other studies Reviewed:  EKG:  The ekg from 09/30/2015  was personally reviewed by me and it revealed sinus bradycardia, 49 BPM   Additional studies/ records that were reviewed personally reviewed by me today include: None available  ASSESSMENT AND PLAN:  Chest pain Atypical in presentation. Risk factors for CAD include age, smoking history, family history although patient is not able to give details of family history. Recommend further evaluation with echocardiogram and exercise nuclear stress test.  Sinus bradycardia. No symptoms of loss of consciousness or extreme fatigue. Will be able to evaluate chronotropic response and exercise stress test. Echocardiogram has been ordered.  Current medicines are reviewed at length with the patient today.  The patient does not have concerns regarding medicines.  Labs/ tests ordered today include:  Orders Placed This Encounter  Procedures  . NM Myocar Multi W/Spect W/Wall Motion / EF  . EKG 12-Lead  . ECHOCARDIOGRAM COMPLETE    I had a lengthy and detailed discussion with the patient regarding diagnoses, prognosis, diagnostic options, treatment options .   I counseled the patient on importance of lifestyle modification including heart healthy diet, regular physical activity   Disposition:   FU with undersigned after tests   I spent at least 45 minutes with the patient today and more than 50% of the time was spent counseling the patient and coordinating care.     Signed, Wende Bushy, MD    09/30/2015 5:23 PM    Luis M. Cintron

## 2015-10-07 ENCOUNTER — Ambulatory Visit: Payer: Self-pay | Admitting: General Surgery

## 2015-10-08 ENCOUNTER — Telehealth: Payer: Self-pay | Admitting: Family Medicine

## 2015-10-08 ENCOUNTER — Telehealth: Payer: Self-pay | Admitting: Cardiology

## 2015-10-08 ENCOUNTER — Ambulatory Visit: Admission: RE | Admit: 2015-10-08 | Payer: Medicaid Other | Source: Ambulatory Visit

## 2015-10-08 DIAGNOSIS — K639 Disease of intestine, unspecified: Secondary | ICD-10-CM

## 2015-10-08 NOTE — Assessment & Plan Note (Signed)
Positive Cologuard; pt has missed 3 appts with surgeon; putting in new referral to another surgical office

## 2015-10-08 NOTE — Telephone Encounter (Signed)
Patient thought appt was in the afternoon.   Patient needs to r/s stress test but does not have a phone .  Needs to r/s.  Spoke with pam RN.

## 2015-10-08 NOTE — Telephone Encounter (Signed)
Thank you so much New referral entered; I spoke with patient briefly, she was frustrated about the situation, so we'll get her to another surgical office

## 2015-10-08 NOTE — Telephone Encounter (Signed)
Patient had appointment with [facility] and they told her that they are not able to see her because she has missed 3 appointments. Patient states that her phone is off and did not receive her reminder and that she had transportation issues.  [facility] just called while patient was standing in front of me and stated she had cancelled 3 times and there protocol is not to see anyone who has cancelled that many times.  Patient requesting referral to be sent to [facility]

## 2015-10-15 ENCOUNTER — Telehealth: Payer: Self-pay | Admitting: Cardiology

## 2015-10-15 ENCOUNTER — Other Ambulatory Visit: Payer: Self-pay

## 2015-10-15 ENCOUNTER — Ambulatory Visit (INDEPENDENT_AMBULATORY_CARE_PROVIDER_SITE_OTHER): Payer: Medicaid Other

## 2015-10-15 DIAGNOSIS — R079 Chest pain, unspecified: Secondary | ICD-10-CM

## 2015-10-15 NOTE — Telephone Encounter (Signed)
Left detailed voicemail message with instructions and appointment reminder for stress test scheduled for tomorrow and instructions to call back if any further questions.

## 2015-10-15 NOTE — Telephone Encounter (Signed)
Patient called back in and stress test is scheduled for Friday October 17, 2015 at 08:00AM. I had left wrong date for test on her voicemail and she called back to verify that it is on Friday.

## 2015-10-17 ENCOUNTER — Encounter
Admission: RE | Admit: 2015-10-17 | Discharge: 2015-10-17 | Disposition: A | Discharge status: Home / Self Care | Payer: Medicaid Other | Source: Ambulatory Visit | Attending: Cardiology | Admitting: Cardiology

## 2015-10-17 DIAGNOSIS — R079 Chest pain, unspecified: Secondary | ICD-10-CM | POA: Diagnosis not present

## 2015-10-17 LAB — NM MYOCAR MULTI W/SPECT W/WALL MOTION / EF
CHL CUP NUCLEAR SDS: 0
CHL CUP NUCLEAR SRS: 1
CHL CUP NUCLEAR SSS: 0
CSEPHR: 41 %
LV sys vol: 41 mL
LVDIAVOL: 87 mL (ref 46–106)
Peak HR: 68 {beats}/min
Rest HR: 43 {beats}/min
TID: 0.92

## 2015-10-17 MED ORDER — REGADENOSON 0.4 MG/5ML IV SOLN
0.4000 mg | Freq: Once | INTRAVENOUS | Status: AC
  Administered 2015-10-17: 0.4 mg via INTRAVENOUS

## 2015-10-17 MED ORDER — TECHNETIUM TC 99M TETROFOSMIN IV KIT
13.0000 | PACK | Freq: Once | INTRAVENOUS | Status: AC | PRN
  Administered 2015-10-17: 14.154 via INTRAVENOUS

## 2015-10-17 MED ORDER — TECHNETIUM TC 99M TETROFOSMIN IV KIT
32.1000 | PACK | Freq: Once | INTRAVENOUS | Status: AC | PRN
  Administered 2015-10-17: 32.1 via INTRAVENOUS

## 2015-10-17 NOTE — Care Management (Signed)
RNCM requested during this visit due to patient concern for being alone and requiring a sitter while here during this visit. This Probation officer met with patient and peer representative with [facility]. He states he has been with patient for 2 weeks and patient has multiple personalities. Patient gave him permission to talk to me- this Probation officer confirmed with patient. He feels that patient regressed to a younger personality during this visit.  [facility] rep is also Renato Shin [phone] and is not present during this visit. PCP is with [facility]. Patient lives alone "and that's the way she wants it". She refused to go to group home or any long term care facility. She states someone through "[employer]" arranged PCS services through [facility] but "they cut her hours to 50". Patient does not have home health. [facility] makes sure she goes to her appointments however patient admitted that her friend from church ([employer]) takes her shopping and to pay her bills. She states she has limited mobility and seizures. [facility] is working on [employer] and [employer] for transportation. Plan this RNCM suggested to [facility] rep and patient: 1- See PCP and ask for home health Physical therapy and social worker. 2- Call case worker with DSS and request more hours. 3- seek long term care in a group home or family care home. Patient and [facility] acknowledged. Nothing this RNCM can do on this outpatient level of care. Patient states that "she is not suicidal".

## 2015-10-22 ENCOUNTER — Ambulatory Visit: Payer: Self-pay | Admitting: Cardiology

## 2015-10-23 ENCOUNTER — Ambulatory Visit: Payer: Self-pay | Admitting: Family Medicine

## 2015-10-30 ENCOUNTER — Ambulatory Visit: Payer: Self-pay | Admitting: Family Medicine

## 2015-11-03 ENCOUNTER — Encounter: Payer: Self-pay | Admitting: Family Medicine

## 2015-11-03 ENCOUNTER — Telehealth: Payer: Self-pay | Admitting: Family Medicine

## 2015-11-03 ENCOUNTER — Ambulatory Visit (INDEPENDENT_AMBULATORY_CARE_PROVIDER_SITE_OTHER): Payer: Medicaid Other | Admitting: Family Medicine

## 2015-11-03 ENCOUNTER — Other Ambulatory Visit: Payer: Self-pay

## 2015-11-03 VITALS — BP 122/76 | HR 54 | Temp 97.8°F | Resp 16 | Wt 138.0 lb

## 2015-11-03 DIAGNOSIS — K639 Disease of intestine, unspecified: Secondary | ICD-10-CM

## 2015-11-03 DIAGNOSIS — M25561 Pain in right knee: Secondary | ICD-10-CM | POA: Insufficient documentation

## 2015-11-03 NOTE — Telephone Encounter (Signed)
I spoke with her; explained home health nurse is not what we're looking for; needing a patient advocate, someone to help with appointments, reminders, getting her to visits, etc.

## 2015-11-03 NOTE — Assessment & Plan Note (Signed)
Positive cologuard; blood in stool; needs to see surgeon for colonoscopy; explained to patient this could be colon cancer, colitis, inflammation, but we need to have her see surgeon; office manager left message for case worker at [facility]; notes written in AVS

## 2015-11-03 NOTE — Progress Notes (Signed)
BP 122/76 mmHg  Pulse 54  Temp(Src) 97.8 F (36.6 C) (Oral)  Resp 16  Wt 138 lb (62.596 kg)  SpO2 98%   Subjective:    Patient ID: Helen Reid, female    DOB: January 29, 1961, 55 y.o.   MRN: [id number]  HPI: Helen Reid is a 55 y.o. female  Chief Complaint  Patient presents with  . Leg Pain    swollen pain  . Weight Loss  . home health    wants a nurse at home    Patient is upset; says we're not doing anything; started the visit very confrontational; I asked what this was all about; she says she hasn't seen anyone for her abnormal colon test I reviewed and have put in two referrals now to general surgery The first appt was in April, patient was not able to go back to Dr. Angie Fava office because she had too many no shows I talked with her about this in May and entered a new referral; she apparently has not seen that doctor yet She is seeing blood in her stool; going on since before the Cologuard was ordered She has a problem with a tetanus shot; that broke trust she says (did not realize it has diphtheria in it too) She has lost weight she says; she has been night sweats; going for months; the patient also notice mucous, slimy stuff sometimes in the stool, not every time, large amounts She had swelling in her legs and  She had a heart appointment and that looked good; echo function is good  Patient has Medicaid She thinks she needs a nurse because she falls She needs home with straightening up stuff Sometimes she can't walk around; she has pain in her legs, feet swell up, joints swell up Complains of right knee pain; going on for months Reviewed chart 4 right knee series complete May 01, 2015 CLINICAL DATA: Chronic knee pain, no known injury, initial encounter  EXAM: RIGHT KNEE - COMPLETE 4+ VIEW  COMPARISON: 06/10/2013  FINDINGS: There is no evidence of fracture, dislocation, or joint effusion. There is no evidence of arthropathy or other focal bone  abnormality. Soft tissues are unremarkable.  IMPRESSION: No acute abnormality noted. No change from the prior exam.   Electronically Signed  By: Inez Catalina M.D.  On: 05/01/2015 11:54  She has tried voltaren and lidocaine, works for a few minutes; she has been referred to an orthopaedist back on May 8th by another provider  Depression screen [facility] 2/9 09/22/2015 09/02/2015 08/21/2015  Decreased Interest 0 0 0  Down, Depressed, Hopeless 0 0 0  PHQ - 2 Score 0 0 0   Relevant past medical, surgical, family and social history reviewed Past Medical History  Diagnosis Date  . Hypertension   . Anxiety   . Depression   . Cancer [facility])     lymphoma, gallbladder, breast  . Vitamin D deficiency 09/27/2015   Past Surgical History  Procedure Laterality Date  . Cholecystectomy    . Breast surgery     Family History  Problem Relation Age of Onset  . Heart disease Mother   . Hypertension Mother   . Asthma Mother   . Heart disease Father    Social History  Substance Use Topics  . Smoking status: Current Some Day Smoker    Types: Cigarettes  . Smokeless tobacco: None  . Alcohol Use: No   Interim medical history since last visit reviewed. Allergies and medications reviewed  Review of Systems Per  HPI unless specifically indicated above     Objective:    BP 122/76 mmHg  Pulse 54  Temp(Src) 97.8 F (36.6 C) (Oral)  Resp 16  Wt 138 lb (62.596 kg)  SpO2 98%  Wt Readings from Last 3 Encounters:  11/03/15 138 lb (62.596 kg)  09/30/15 141 lb 6.4 oz (64.139 kg)  09/22/15 141 lb 3.2 oz (64.048 kg)    Physical Exam  Constitutional: She appears well-developed and well-nourished.  Eyes: EOM are normal. No scleral icterus.  Neck: No JVD present.  Cardiovascular: Normal rate and regular rhythm.   Pulmonary/Chest: Effort normal and breath sounds normal.  Abdominal: Soft. Bowel sounds are normal. She exhibits no distension.  Musculoskeletal:       Right knee: She exhibits no  swelling and no effusion.  Psychiatric: Her affect is angry. She is aggressive. She is not agitated, not hyperactive and not combative.  Confrontational; wore a large-brimmed hat, did not take it off, fair eye contact with examiner    Results for orders placed or performed during the hospital encounter of 10/17/15  NM Myocar Multi W/Spect W/Wall Motion / EF  Result Value Ref Range   Rest HR 43 bpm   Rest BP 134/91 mmHg   Exercise duration (min)  min   Exercise duration (sec)  sec   Estimated workload  METS   Peak HR 68 bpm   Peak BP 114/73 mmHg   MPHR  bpm   Percent HR 41 %   RPE     LV sys vol 41 mL   TID 0.92    LV dias vol 87 46 - 106 mL   LHR     SSS 0    SRS 1    SDS 0       Assessment & Plan:   Problem List Items Addressed This Visit      Digestive   Colon abnormality    Positive cologuard; blood in stool; needs to see surgeon for colonoscopy; explained to patient this could be colon cancer, colitis, inflammation, but we need to have her see surgeon; office manager left message for case worker at [facility]; notes written in AVS        Other   Right knee pain - Primary    Normal xrays done six months ago; reviewed with patient; no visible swelling today; she has already been referred to orthopaedist; patient to contact her case worker and arrange to see specialist         Follow up plan: Return in about 3 weeks (around 11/24/2015).  An after-visit summary was printed and given to the patient at [place].  Please see the patient instructions which may contain other information and recommendations beyond what is mentioned above in the assessment and plan.

## 2015-11-03 NOTE — Assessment & Plan Note (Signed)
Normal xrays done six months ago; reviewed with patient; no visible swelling today; she has already been referred to orthopaedist; patient to contact her case worker and arrange to see specialist

## 2015-11-03 NOTE — Patient Instructions (Addendum)
I need you to see the general surgeon about your abnormal colon test Please call your case worker today about making these appointments You have been referred by this office to an orthopaedist about the right knee pain You have been referred by this office to a general surgeon about the abnormal colon test and blood in your stool We need them to help figure out if you have cancer or inflammation or colitis

## 2015-11-03 NOTE — Telephone Encounter (Signed)
[facility] [employer] to find out who the case worker for patient Helen Reid in order to coordinate care for patient.  I was only able to leave a message and was informed by voice message that the called would be returned with in 48 hours.

## 2015-11-05 ENCOUNTER — Encounter: Payer: Self-pay | Admitting: Cardiology

## 2015-11-05 ENCOUNTER — Ambulatory Visit (INDEPENDENT_AMBULATORY_CARE_PROVIDER_SITE_OTHER): Payer: Medicaid Other | Admitting: Cardiology

## 2015-11-05 VITALS — BP 110/68 | HR 50 | Ht 66.0 in | Wt 137.0 lb

## 2015-11-05 DIAGNOSIS — R001 Bradycardia, unspecified: Secondary | ICD-10-CM

## 2015-11-05 DIAGNOSIS — I351 Nonrheumatic aortic (valve) insufficiency: Secondary | ICD-10-CM

## 2015-11-05 DIAGNOSIS — R0602 Shortness of breath: Secondary | ICD-10-CM | POA: Diagnosis not present

## 2015-11-05 NOTE — Patient Instructions (Addendum)
Medication Instructions:  Your physician recommends that you continue on your current medications as directed. Please refer to the Current Medication list given to you today.   Labwork: None ordered  Testing/Procedures: Your physician has recommended that you wear a holter monitor. Holter monitors are medical devices that record the heart's electrical activity. Doctors most often use these monitors to diagnose arrhythmias. Arrhythmias are problems with the speed or rhythm of the heartbeat. The monitor is a small, portable device. You can wear one while you do your normal daily activities. This is usually used to diagnose what is causing palpitations/syncope (passing out).  Follow-Up: Your physician wants you to follow-up in: 9 months with Dr. Yvone Neu. You will receive a reminder letter in the mail two months in advance. If you don't receive a letter, please call our office to schedule the follow-up appointment.  We will call you with holter results.  It was a pleasure seeing you today here in the office. Please do not hesitate to give [place] a call back if you have any further questions. [place], BSN      Any Other Special Instructions Will Be Listed Below (If Applicable).     If you need a refill on your cardiac medications before your next appointment, please call your pharmacy.  Holter Monitoring A Holter monitor is a small device that is used to detect abnormal heart rhythms. It clips to your clothing and is connected by wires to flat, sticky disks (electrodes) that attach to your chest. It is worn continuously for 24-48 hours. HOME CARE INSTRUCTIONS  Wear your Holter monitor at all times, even while exercising and sleeping, for as long as directed by your health care provider.  Make sure that the Holter monitor is safely clipped to your clothing or close to your body as recommended by your health care provider.  Do not get the monitor or wires wet.  Do not  put body lotion or moisturizer on your chest.  Keep your skin clean.  Keep a diary of your daily activities, such as walking and doing chores. If you feel that your heartbeat is abnormal or that your heart is fluttering or skipping a beat:  Record what you are doing when it happens.  Record what time of day the symptoms occur.  Return your Holter monitor as directed by your health care provider.  Keep all follow-up visits as directed by your health care provider. This is important. SEEK IMMEDIATE MEDICAL CARE IF:  You feel lightheaded or you faint.  You have trouble breathing.  You feel pain in your chest, upper arm, or jaw.  You feel sick to your stomach and your skin is pale, cool, or damp.  You heartbeat feels unusual or abnormal.   This information is not intended to replace advice given to you by your health care provider. Make sure you discuss any questions you have with your health care provider.   Document Released: 01/30/2004 Document Revised: 05/24/2014 Document Reviewed: 12/10/2013 Elsevier Interactive Patient Education [employer].

## 2015-11-05 NOTE — Progress Notes (Signed)
Cardiology Office Note   Date:  11/05/2015   ID:  Helen Reid, DOB 01-Jan-1961, MRN [id number]  Referring Doctor:  Enid Derry, MD   Cardiologist:   Wende Bushy, MD   Reason for consultation:  Chief Complaint  Patient presents with  . other    Follow up from Echo and Nuclear myoview. Meds reviewed by the patient verbally.       History of Present Illness: Helen Reid is a 55 y.o. female who presents for Follow-up after tests. Patient hasn't had any recurrence of chest pain or shortness of breath. Her main issue now is knee pain. We talked about asking her to follow-up with her PCP for this.  Patient denies fever, cough, colds, abdominal pain. No PND, orthopnea, edema.  ROS:  Please see the history of present illness. Aside from mentioned under HPI, all other systems are reviewed and negative.    Past Medical History  Diagnosis Date  . Hypertension   . Anxiety   . Depression   . Cancer [facility])     lymphoma, gallbladder, breast  . Vitamin D deficiency 09/27/2015  . PTSD (post-traumatic stress disorder)     Past Surgical History  Procedure Laterality Date  . Cholecystectomy    . Breast surgery       reports that she has been smoking Cigarettes.  She has been smoking about 0.00 packs per day. She does not have any smokeless tobacco history on file. She reports that she uses illicit drugs (Marijuana). She reports that she does not drink alcohol. She started smoking at age 16.   family history includes Asthma in her mother; Heart disease in her father and mother; Hypertension in her mother.   Current Outpatient Prescriptions  Medication Sig Dispense Refill  . diclofenac sodium (VOLTAREN) 1 % GEL Use 2 grams on hand, 4 grams on knee; up to four times a day PRN pain 100 g 2  . diphenhydrAMINE (BENADRYL) 25 mg capsule Take 1 capsule (25 mg total) by mouth at bedtime as needed for sleep. 30 capsule 2  . levothyroxine (SYNTHROID, LEVOTHROID) 200 MCG tablet Half of a pill  by mouth daily for two weeks (4/14-4/28), then one whole pill daily -- no new Rx sent 08/29/15 1 tablet 0  . lidocaine (XYLOCAINE) 5 % ointment Apply 1 application topically 2 (two) times daily as needed. Mixed with aloe vera and/or oil, small amount topically to hands PRN 50 g 1  . Vitamin D, Ergocalciferol, (DRISDOL) 50000 units CAPS capsule Take 1 capsule (50,000 Units total) by mouth every 30 (thirty) days. 1 capsule 2   No current facility-administered medications for this visit.    Allergies: Aspirin; Dilantin ; Macrolides and ketolides; Phenobarbital; and Penicillins    PHYSICAL EXAM: VS:  BP 110/68 mmHg  Pulse 50  Ht 5\' 6"  (1.676 m)  Wt 137 lb (62.143 kg)  BMI 22.12 kg/m2 , Body mass index is 22.12 kg/(m^2). Wt Readings from Last 3 Encounters:  11/05/15 137 lb (62.143 kg)  11/03/15 138 lb (62.596 kg)  09/30/15 141 lb 6.4 oz (64.139 kg)    GENERAL:  well developed, well nourished, not in acute distress HEENT: normocephalic, pink conjunctivae, anicteric sclerae, no xanthelasma, normal dentition, oropharynx clear NECK:  no neck vein engorgement, JVP normal, no hepatojugular reflux, carotid upstroke brisk and symmetric, no bruit, no thyromegaly, no lymphadenopathy LUNGS:  good respiratory effort, clear to auscultation bilaterally CV:  PMI not displaced, no thrills, no lifts, S1 and S2 within normal  limits, no palpable S3 or S4, n, no rubs, no gallops, 3/6 systolic murmur, likely TR ABD:  Soft, nontender, nondistended, normoactive bowel sounds, no abdominal aortic bruit, no hepatomegaly, no splenomegaly MS: nontender back, no kyphosis, no scoliosis, no joint deformities EXT:  2+ DP/PT pulses, no edema, no varicosities, no cyanosis, no clubbing SKIN: warm, nondiaphoretic, normal turgor, no ulcers NEUROPSYCH: alert, oriented to person, place, and time, sensory/motor grossly intact, normal mood, appropriate affect  Recent Labs: 07/07/2015: ALT 20; Hemoglobin 11.7*; Platelets  182 08/21/2015: BUN 13; Creatinine, Ser 1.25*; Potassium 4.4; Sodium 140; TSH 75.430*   Lipid Panel    Component Value Date/Time   CHOL 233* 05/27/2015 0611   CHOL 160 12/22/2013 0610   TRIG 80 05/27/2015 0611   TRIG 76 12/22/2013 0610   HDL 63 05/27/2015 0611   HDL 57 12/22/2013 0610   CHOLHDL 3.7 05/27/2015 0611   VLDL 16 05/27/2015 0611   VLDL 15 12/22/2013 0610   LDLCALC 154* 05/27/2015 0611   LDLCALC 88 12/22/2013 0610     Other studies Reviewed:  EKG:  The ekg from 09/30/2015  was personally reviewed by me and it revealed sinus bradycardia, 49 BPM   Additional studies/ records that were reviewed personally reviewed by me today include:  Echocardiogram 10/15/2015: Left ventricle: The cavity size was normal. Wall thickness was  normal. Systolic function was normal. The estimated ejection  fraction was in the range of 60% to 65%. Wall motion was normal;  there were no regional wall motion abnormalities. Doppler  parameters are consistent with high ventricular filling pressure. - Aortic valve: There was moderate regurgitation. - Mitral valve: There was mild regurgitation. - Tricuspid valve: There was moderate regurgitation. - Pericardium, extracardiac: A trivial pericardial effusion was  identified.  Stress test 10/17/2015:  There was no ST segment deviation noted during stress.  Defect 1: There is a small defect of mild severity present in the apex location. This is likely due to breast attenuation.  The study is normal.  This is a low risk study.  The left ventricular ejection fraction is normal (55-65%).   ASSESSMENT AND PLAN:  Chest pain Atypical in presentation. Risk factors for CAD include age, smoking history, family history although patient is not able to give details of family history. No evidence of ischemia on nuclear stress test. Discussed findings with patient. Patient reassured. Likelihood of clinically significant CAD is very low based on  this test.  Sinus bradycardia. No symptoms of loss of consciousness or extreme fatigue.  Stress test was a pharmacologic stress test due to patient being unable to exercise on treadmill. 24-hour Holter monitor recommended.  Aortic regurgitation Tricuspid regurgitation Recommend reevaluation, likely in one year. We'll also reevaluate a trivial pericardial effusion at that time. No evidence of tamponade.    Current medicines are reviewed at length with the patient today.  The patient does not have concerns regarding medicines.  Labs/ tests ordered today include:  Orders Placed This Encounter  Procedures  . Holter monitor - 24 hour  . EKG 12-Lead    I had a lengthy and detailed discussion with the patient regarding diagnoses, prognosis, diagnostic options, treatment options .   I counseled the patient on importance of lifestyle modification including heart healthy diet, regular physical activity   Disposition:   FU with undersigned after tests when necessary or in 9 months to ffup for aortic insufficiency     Signed, Wende Bushy, MD  11/05/2015 2:10 PM    [facility]

## 2015-11-08 ENCOUNTER — Emergency Department: Payer: Medicaid Other

## 2015-11-08 ENCOUNTER — Emergency Department
Admission: EM | Admit: 2015-11-08 | Discharge: 2015-11-09 | Disposition: A | Discharge status: Home / Self Care | Payer: Medicaid Other | Attending: Emergency Medicine | Admitting: Emergency Medicine

## 2015-11-08 ENCOUNTER — Encounter: Payer: Self-pay | Admitting: Emergency Medicine

## 2015-11-08 DIAGNOSIS — N390 Urinary tract infection, site not specified: Secondary | ICD-10-CM | POA: Diagnosis not present

## 2015-11-08 DIAGNOSIS — F17210 Nicotine dependence, cigarettes, uncomplicated: Secondary | ICD-10-CM | POA: Insufficient documentation

## 2015-11-08 DIAGNOSIS — Z853 Personal history of malignant neoplasm of breast: Secondary | ICD-10-CM | POA: Insufficient documentation

## 2015-11-08 DIAGNOSIS — Z8572 Personal history of non-Hodgkin lymphomas: Secondary | ICD-10-CM | POA: Diagnosis not present

## 2015-11-08 DIAGNOSIS — I129 Hypertensive chronic kidney disease with stage 1 through stage 4 chronic kidney disease, or unspecified chronic kidney disease: Secondary | ICD-10-CM | POA: Diagnosis not present

## 2015-11-08 DIAGNOSIS — F1290 Cannabis use, unspecified, uncomplicated: Secondary | ICD-10-CM | POA: Diagnosis not present

## 2015-11-08 DIAGNOSIS — R609 Edema, unspecified: Secondary | ICD-10-CM

## 2015-11-08 DIAGNOSIS — Z79899 Other long term (current) drug therapy: Secondary | ICD-10-CM | POA: Insufficient documentation

## 2015-11-08 DIAGNOSIS — R1030 Lower abdominal pain, unspecified: Secondary | ICD-10-CM | POA: Insufficient documentation

## 2015-11-08 DIAGNOSIS — F331 Major depressive disorder, recurrent, moderate: Secondary | ICD-10-CM | POA: Diagnosis not present

## 2015-11-08 DIAGNOSIS — R600 Localized edema: Secondary | ICD-10-CM | POA: Diagnosis not present

## 2015-11-08 DIAGNOSIS — Z8509 Personal history of malignant neoplasm of other digestive organs: Secondary | ICD-10-CM | POA: Diagnosis not present

## 2015-11-08 DIAGNOSIS — E039 Hypothyroidism, unspecified: Secondary | ICD-10-CM | POA: Insufficient documentation

## 2015-11-08 DIAGNOSIS — N182 Chronic kidney disease, stage 2 (mild): Secondary | ICD-10-CM | POA: Insufficient documentation

## 2015-11-08 DIAGNOSIS — M79661 Pain in right lower leg: Secondary | ICD-10-CM | POA: Diagnosis present

## 2015-11-08 DIAGNOSIS — M79604 Pain in right leg: Secondary | ICD-10-CM

## 2015-11-08 LAB — COMPREHENSIVE METABOLIC PANEL
ALK PHOS: 53 U/L (ref 38–126)
ALT: 48 U/L (ref 14–54)
AST: 37 U/L (ref 15–41)
Albumin: 4 g/dL (ref 3.5–5.0)
Anion gap: 6 (ref 5–15)
BUN: 15 mg/dL (ref 6–20)
CALCIUM: 8.7 mg/dL — AB (ref 8.9–10.3)
CHLORIDE: 111 mmol/L (ref 101–111)
CO2: 25 mmol/L (ref 22–32)
CREATININE: 1.08 mg/dL — AB (ref 0.44–1.00)
GFR, EST NON AFRICAN AMERICAN: 56 mL/min — AB (ref >60)
Glucose, Bld: 126 mg/dL — ABNORMAL HIGH (ref 65–99)
Potassium: 4.1 mmol/L (ref 3.5–5.1)
Sodium: 142 mmol/L (ref 135–145)
Total Bilirubin: 0.6 mg/dL (ref 0.3–1.2)
Total Protein: 6.6 g/dL (ref 6.5–8.1)

## 2015-11-08 LAB — CBC WITH DIFFERENTIAL/PLATELET
Basophils Absolute: 0 10*3/uL (ref 0–0.1)
Basophils Relative: 1 %
Eosinophils Absolute: 0.1 10*3/uL (ref 0–0.7)
HCT: 32.5 % — ABNORMAL LOW (ref 35.0–47.0)
Hemoglobin: 10.7 g/dL — ABNORMAL LOW (ref 12.0–16.0)
LYMPHS ABS: 1.6 10*3/uL (ref 1.0–3.6)
MCH: 23.9 pg — AB (ref 26.0–34.0)
MCHC: 32.9 g/dL (ref 32.0–36.0)
MCV: 72.6 fL — AB (ref 80.0–100.0)
Monocytes Absolute: 0.4 10*3/uL (ref 0.2–0.9)
Neutro Abs: 3.2 10*3/uL (ref 1.4–6.5)
Neutrophils Relative %: 59 %
PLATELETS: 172 10*3/uL (ref 150–440)
RBC: 4.48 MIL/uL (ref 3.80–5.20)
RDW: 15.2 % — ABNORMAL HIGH (ref 11.5–14.5)
WBC: 5.4 10*3/uL (ref 3.6–11.0)

## 2015-11-08 LAB — URINALYSIS COMPLETE WITH MICROSCOPIC (ARMC ONLY)
BILIRUBIN URINE: NEGATIVE
GLUCOSE, UA: NEGATIVE mg/dL
HGB URINE DIPSTICK: NEGATIVE
KETONES UR: NEGATIVE mg/dL
NITRITE: NEGATIVE
Protein, ur: NEGATIVE mg/dL
SPECIFIC GRAVITY, URINE: 1.015 (ref 1.005–1.030)
pH: 7 (ref 5.0–8.0)

## 2015-11-08 LAB — LIPASE, BLOOD: LIPASE: 30 U/L (ref 11–51)

## 2015-11-08 MED ORDER — OXYCODONE-ACETAMINOPHEN 5-325 MG PO TABS
1.0000 | ORAL_TABLET | Freq: Once | ORAL | Status: AC
  Administered 2015-11-08: 1 via ORAL
  Filled 2015-11-08: qty 1

## 2015-11-08 MED ORDER — NITROFURANTOIN MONOHYD MACRO 100 MG PO CAPS
100.0000 mg | ORAL_CAPSULE | Freq: Once | ORAL | Status: AC
  Administered 2015-11-08: 100 mg via ORAL
  Filled 2015-11-08: qty 1

## 2015-11-08 MED ORDER — NITROFURANTOIN MONOHYD MACRO 100 MG PO CAPS: 100.0000 mg | ORAL_CAPSULE | Freq: Two times a day (BID) | ORAL | Status: AC

## 2015-11-08 MED ORDER — DIPHENHYDRAMINE HCL 25 MG PO CAPS
25.0000 mg | ORAL_CAPSULE | Freq: Once | ORAL | Status: AC
  Administered 2015-11-08: 25 mg via ORAL

## 2015-11-08 MED ORDER — ONDANSETRON HCL 4 MG PO TABS
4.0000 mg | ORAL_TABLET | Freq: Once | ORAL | Status: AC
  Administered 2015-11-08: 4 mg via ORAL
  Filled 2015-11-08: qty 1

## 2015-11-08 MED ORDER — DIPHENHYDRAMINE HCL 25 MG PO CAPS
ORAL_CAPSULE | ORAL | Status: AC
  Administered 2015-11-08: 25 mg via ORAL
  Filled 2015-11-08: qty 1

## 2015-11-08 NOTE — Discharge Instructions (Signed)

## 2015-11-08 NOTE — ED Notes (Signed)
Patient returned from US.

## 2015-11-08 NOTE — ED Provider Notes (Addendum)
[facility] Emergency Department Provider Note   ____________________________________________  Time seen: Seen upon arrival to the emergency department  I have reviewed the triage vital signs and the nursing notes.   HISTORY  Chief Complaint Abdominal Pain and Leg Pain   HPI Helen Reid is a 55 y.o. female with recent diagnosis of colon cancer Emergency department with acute on chronic abdominal pain with nausea vomiting and diarrhea as well as right lower extremity pain with swelling. She says that all these issues have been ongoing for months. However, she is presenting today because of vomiting 4 times as well as having 4 episodes of diarrhea in hour prior to arrival. She denies any blood in her vomit. However, she does say there are blood streaks in her stool. She has been seen as an outpatient by Dr. Sanda Klein and is also been scheduled for surgical follow-up after the diagnosis of her colon cancer. However, due to multiple missed appointments with her initial surgeon she is now being referred to a different surgeon.She says the pain is cramping in the lower abdomen. The pain is nonradiating. Denies any recent antibiotics.  Patient says that the right lower extremity pain is aching and has been ongoing with swelling over the past 6 months or so. She has had a negative DVT ultrasound study done several months ago for this.   Past Medical History  Diagnosis Date  . Hypertension   . Anxiety   . Depression   . Cancer [facility])     lymphoma, gallbladder, breast  . Vitamin D deficiency 09/27/2015  . PTSD (post-traumatic stress disorder)     Patient Active Problem List   Diagnosis Date Noted  . Right knee pain 11/03/2015  . Vitamin D deficiency 09/27/2015  . Colon abnormality 09/09/2015  . Pain and swelling of right lower leg 09/02/2015  . Bradycardia 09/02/2015  . Need for Tdap vaccination 09/02/2015  . Dermatitis 08/21/2015  . Anemia 08/21/2015  . Pain,  joint, multiple sites 08/21/2015  . PTSD (post-traumatic stress disorder) 05/29/2015  . Borderline personality disorder 05/29/2015  . Tobacco use disorder 05/26/2015  . Major depressive disorder, recurrent episode, moderate ([place]) 05/26/2015  . Cannabis use disorder, moderate, dependence ([place]) 05/26/2015  . CAFL (chronic airflow limitation) ([place]) 10/17/2014  . Chronic kidney disease (CKD), stage II (mild) 10/17/2014  . Hypertriglyceridemia 10/17/2014  . Adult hypothyroidism 10/17/2014  . Irritable bowel syndrome with constipation 10/17/2014  . AI (aortic incompetence) 10/17/2014  . Arthralgia of multiple joints 10/17/2014    Past Surgical History  Procedure Laterality Date  . Cholecystectomy    . Breast surgery      Current Outpatient Rx  Name  Route  Sig  Dispense  Refill  . albuterol (PROVENTIL HFA;VENTOLIN HFA) 108 (90 Base) MCG/ACT inhaler   Inhalation   Inhale 2 puffs into the lungs every 6 (six) hours as needed for wheezing or shortness of breath.         . diclofenac sodium (VOLTAREN) 1 % GEL      Use 2 grams on hand, 4 grams on knee; up to four times a day PRN pain   100 g   2   . diphenhydrAMINE (BENADRYL) 25 mg capsule   Oral   Take 1 capsule (25 mg total) by mouth at bedtime as needed for sleep.   30 capsule   2   . levothyroxine (SYNTHROID, LEVOTHROID) 200 MCG tablet      Half of a pill by mouth daily  for two weeks (4/14-4/28), then one whole pill daily -- no new Rx sent 08/29/15 Patient taking differently: Take 200 mcg by mouth daily before breakfast.    1 tablet   0   . lidocaine (XYLOCAINE) 5 % ointment   Topical   Apply 1 application topically 2 (two) times daily as needed. Mixed with aloe vera and/or oil, small amount topically to hands PRN   50 g   1     Please deliver -- thank you!   . Vitamin D, Ergocalciferol, (DRISDOL) 50000 units CAPS capsule   Oral   Take 1 capsule (50,000 Units total) by mouth every 30 (thirty) days.   1 capsule   2      Allergies Penicillins; Aspirin; Dilantin ; Macrolides and ketolides; and Phenobarbital  Family History  Problem Relation Age of Onset  . Heart disease Mother   . Hypertension Mother   . Asthma Mother   . Heart disease Father     Social History Social History  Substance Use Topics  . Smoking status: Current Some Day Smoker -- 0.00 packs/day    Types: Cigarettes  . Smokeless tobacco: None  . Alcohol Use: No    Review of Systems Constitutional: No fever/chills Eyes: No visual changes. ENT: No sore throat. Cardiovascular: Denies chest pain. Respiratory: Denies shortness of breath. Gastrointestinal: No constipation. Genitourinary: Negative for dysuria. Musculoskeletal: Negative for back pain. Skin: Negative for rash. Neurological: Negative for headaches, focal weakness or numbness.  10-point ROS otherwise negative.  ____________________________________________   PHYSICAL EXAM:  VITAL SIGNS: ED Triage Vitals  Enc Vitals Group     BP 11/08/15 1647 134/83 mmHg     Pulse Rate 11/08/15 1647 54     Resp 11/08/15 1647 20     Temp 11/08/15 1647 97.5 F (36.4 C)     Temp src --      SpO2 11/08/15 1647 97 %     Weight 11/08/15 1647 135 lb (61.236 kg)     Height 11/08/15 1647 5\' 6"  (1.676 m)     Head Cir --      Peak Flow --      Pain Score 11/08/15 1649 10     Pain Loc --      Pain Edu? --      Excl. in [place]? --     Constitutional: Alert and oriented. In no acute distress. Patient has a very aggressive tone of voice and refuses to answer multiple questions, telling me to check her record for any further information about her medical history. Eyes: Conjunctivae are normal. PERRL. EOMI. Head: Atraumatic. Nose: No congestion/rhinnorhea. Mouth/Throat: Mucous membranes are moist.  Neck: No stridor.   Cardiovascular: Normal rate, regular rhythm. Grossly normal heart sounds.  Respiratory: Normal respiratory effort.  No retractions. Lungs CTAB. Gastrointestinal: Soft  with mild lower abdominal tenderness to palpation. No distention. No CVA tenderness. Musculoskeletal: Mildly swollen right lower extremity starting at the mid calf up to the mid thigh. Tenderness to palpation diffusely. No joint effusions. No erythema. No warmth. Dorsalis pedis pulses are intact and equal bilaterally. Neurologic:  Normal speech and language. No gross focal neurologic deficits are appreciated. Skin:  Skin is warm, dry and intact. No rash noted. Psychiatric: Mood and affect are normal. Speech and behavior are normal.  ____________________________________________   LABS (all labs ordered are listed, but only abnormal results are displayed)  Labs Reviewed  CBC WITH DIFFERENTIAL/PLATELET - Abnormal; Notable for the following:    Hemoglobin 10.7 (*)  HCT 32.5 (*)    MCV 72.6 (*)    MCH 23.9 (*)    RDW 15.2 (*)    All other components within normal limits  COMPREHENSIVE METABOLIC PANEL - Abnormal; Notable for the following:    Glucose, Bld 126 (*)    Creatinine, Ser 1.08 (*)    Calcium 8.7 (*)    GFR calc non Af Amer 56 (*)    All other components within normal limits  URINALYSIS COMPLETEWITH MICROSCOPIC ([facility] ONLY) - Abnormal; Notable for the following:    Color, Urine YELLOW (*)    APPearance CLEAR (*)    Leukocytes, UA 3+ (*)    Bacteria, UA RARE (*)    Squamous Epithelial / LPF 0-5 (*)    All other components within normal limits  C DIFFICILE QUICK SCREEN W PCR REFLEX  GASTROINTESTINAL PANEL BY PCR, STOOL (REPLACES STOOL CULTURE)  LIPASE, BLOOD   ____________________________________________  EKG   ____________________________________________  RADIOLOGY    US Venous Img Lower Unilateral Right (Final result) Result time: 11/08/15 22:19:21   Final result by Rad Results In Interface (11/08/15 22:19:21)   Narrative:   CLINICAL DATA: Right lower extremity swelling  EXAM: RIGHT LOWER EXTREMITY VENOUS DOPPLER ULTRASOUND  TECHNIQUE: Gray-scale  sonography with graded compression, as well as color Doppler and duplex ultrasound were performed to evaluate the lower extremity deep venous systems from the level of the common femoral vein and including the common femoral, femoral, profunda femoral, popliteal and calf veins including the posterior tibial, peroneal and gastrocnemius veins when visible. The superficial great saphenous vein was also interrogated. Spectral Doppler was utilized to evaluate flow at rest and with distal augmentation maneuvers in the common femoral, femoral and popliteal veins.  COMPARISON: None.  FINDINGS: Contralateral Common Femoral Vein: Respiratory phasicity is normal and symmetric with the symptomatic side. No evidence of thrombus. Normal compressibility.  Common Femoral Vein: No evidence of thrombus. Normal compressibility, respiratory phasicity and response to augmentation.  Saphenofemoral Junction: No evidence of thrombus. Normal compressibility and flow on color Doppler imaging.  Profunda Femoral Vein: No evidence of thrombus. Normal compressibility and flow on color Doppler imaging.  Femoral Vein: No evidence of thrombus. Normal compressibility, respiratory phasicity and response to augmentation.  Popliteal Vein: No evidence of thrombus. Normal compressibility, respiratory phasicity and response to augmentation.  Calf Veins: No evidence of thrombus. Normal compressibility and flow on color Doppler imaging.  Superficial Great Saphenous Vein: No evidence of thrombus. Normal compressibility and flow on color Doppler imaging.  Venous Reflux: None.  Other Findings: None.  IMPRESSION: No evidence of deep venous thrombosis.   Electronically Signed By: Dorise Bullion III M.D On: 11/08/2015 22:19          CT RENAL STONE STUDY (Final result) Result time: 11/08/15 18:46:41   Final result by Rad Results In Interface (11/08/15 18:46:41)   Narrative:   CLINICAL DATA:  Lower abdominal and RIGHT leg pain, hypertension, prior cholecystectomy, smoker, irritable bowel syndrome  EXAM: CT ABDOMEN AND PELVIS WITHOUT CONTRAST  TECHNIQUE: Multidetector CT imaging of the abdomen and pelvis was performed following the standard protocol without IV contrast. Sagittal and coronal MPR images reconstructed from axial data set.  COMPARISON: None  FINDINGS: Lower chest: Lung bases clear  Hepatobiliary: Gallbladder surgically absent. Distention of IVC and hepatic veins suggesting elevated RIGHT heart pressure versus fluid overload. No focal hepatic lesions.  Pancreas: No definite abnormalities  Spleen: Normal appearance  Adrenals/Urinary Tract: Adrenal glands unremarkable. Small LEFT renal cyst 16  x 13 mm image 15. No urinary tract calcification or hydronephrosis definitely visualized though the courses of the ureters are poorly delineated due to lack of adequate tissue planes. Bladder contains minimal urine, grossly unremarkable  Stomach/Bowel: Suboptimal assessment of stomach and bowel loops due to lack of tissue planes and lack of IV/oral contrast. Food debris within stomach. Scattered stool throughout colon to rectum. No definite bowel wall thickening or evidence of obstruction.  Vascular/Lymphatic: Aorta normal caliber. No gross evidence of adenopathy though assessment of the retroperitoneum was limited by the lack of adequate tissue planes in the periaortic region. Scattered pelvic phleboliths.  Reproductive: Unremarkable uterus and adnexa.  Other: No definite free air or free fluid.  Musculoskeletal: Degenerative disc disease changes L5-S1. Facet degenerative changes lower lumbar spine.  IMPRESSION: Suboptimal assessment of intra-abdominal intrapelvic structures due to lack of adequate tissue planes.  New line small LEFT renal cyst.  No definite acute intra-abdominal or intrapelvic abnormalities identified within above  limitations.   Electronically Signed By: Lavonia Dana M.D. On: 11/08/2015 18:46          DG Abd 1 View (Final result) Result time: 11/08/15 17:43:35   Final result by Rad Results In Interface (11/08/15 17:43:35)   Narrative:   CLINICAL DATA: Abdominal pain  EXAM: ABDOMEN - 1 VIEW  COMPARISON: None.  FINDINGS: A surgical clip in the right upper quadrant is likely due to previous cholecystectomy. Both kidneys are partially obscured but no stones are seen. Calcifications in the left side of the pelvis may simply represent phleboliths. If there is concern for a distal left ureteral stone, CT imaging would be more sensitive and specific. The bowel gas pattern is nonobstructive. No free air, portal venous gas, or pneumatosis.  IMPRESSION: Calcifications in the left pelvis are probably phleboliths. If there is concern for a distal left ureteral stone, CT imaging would be more specific.   Electronically Signed By: Dorise Bullion III M.D On: 11/08/2015 17:43                           ____________________________________________   PROCEDURES   ____________________________________________   INITIAL IMPRESSION / ASSESSMENT AND PLAN / ED COURSE  Pertinent labs & imaging results that were available during my care of the patient were reviewed by me and considered in my medical decision making (see chart for details).  ----------------------------------------- 11:33 PM on 11/08/2015 -----------------------------------------  Patient asleep in the bed when answered the room. I expanded her reassuring lab as well as imaging results. It does appear that she is a UTI. I will treat her with Macrobid and we will send a culture. The complaints appear to be worsening chronic issues. I told her that she would likely need to follow-up with her primary care doctor and also continue to pursue her outpatient surgical office visit. Due to the nature of the  chronic pain and will not be prescribing her narcotic pain medications. She did ask me for pain meds but it explained to her because there are chronic issues I would not be able to prescribe her meds per the [facility]. She is understanding when to comply. She'll be discharged home. She says that she occasionally walks with a cane and has a baseline ambulatory difficulty. ____________________________________________   FINAL CLINICAL IMPRESSION(S) / ED DIAGNOSES  Final diagnoses:  Lower abdominal pain  UTI. Right lower extremity pain with swelling.    NEW MEDICATIONS STARTED DURING THIS VISIT:  New Prescriptions  No medications on file     Note:  This document was prepared using Dragon voice recognition software and may include unintentional dictation errors.    Orbie Pyo, MD 11/08/15 2335  Patient has a known resting sinus tachycardia.  Orbie Pyo, MD 11/08/15 [phone]

## 2015-11-08 NOTE — ED Notes (Signed)
MD at bedside. 

## 2015-11-08 NOTE — ED Notes (Signed)
Patient transported to Ultrasound 

## 2015-11-08 NOTE — ED Notes (Signed)
Patient given instructions on urine sample and stool sample needed. Hat placed in toilet. Explained to patient that samples need to be separate and not in the same hat.  Patient requesting 2 pillows. Pt given 2 pillows.  Pt also states she would like me to call her brother in [place].  Told patient I would give her a phone to call him and she can talk to her brother about her being in the hospital.

## 2015-11-08 NOTE — ED Notes (Signed)
Patient Arrives [facility] to [facility] ED with complaint of abd pain and right leg pain. When asked "specifically where the leg hurts" patient yells "THE WHOLE LEG HURTS". Patient keeps repeating "its all in my chart, read my chart" Patient A+OX4 but very uncooperative

## 2015-11-12 ENCOUNTER — Telehealth: Payer: Self-pay | Admitting: Family Medicine

## 2015-11-12 NOTE — Telephone Encounter (Signed)
PT SAID THAT DR WAS TO HAVE SET HER UP A REFERRAL APPT FOR [facility] BUT IT LOOKS LIKE IT HAS NOT BEEN DONE YET. SAYS THAT THIS SHOULD HAVE BEEN DONE A FEW WEEKS AGO.

## 2015-11-13 NOTE — Telephone Encounter (Signed)
Faxed referral to [facility], they will review and contact patient with appointment details.

## 2015-11-24 ENCOUNTER — Ambulatory Visit (INDEPENDENT_AMBULATORY_CARE_PROVIDER_SITE_OTHER): Payer: Medicaid Other | Admitting: Family Medicine

## 2015-11-24 ENCOUNTER — Ambulatory Visit (INDEPENDENT_AMBULATORY_CARE_PROVIDER_SITE_OTHER): Payer: Medicaid Other

## 2015-11-24 ENCOUNTER — Encounter: Payer: Self-pay | Admitting: Family Medicine

## 2015-11-24 VITALS — BP 124/62 | HR 57 | Temp 97.5°F | Resp 16 | Wt 135.0 lb

## 2015-11-24 DIAGNOSIS — R0602 Shortness of breath: Secondary | ICD-10-CM | POA: Diagnosis not present

## 2015-11-24 DIAGNOSIS — F603 Borderline personality disorder: Secondary | ICD-10-CM

## 2015-11-24 DIAGNOSIS — Z91148 Patient's other noncompliance with medication regimen for other reason: Secondary | ICD-10-CM | POA: Insufficient documentation

## 2015-11-24 DIAGNOSIS — F17200 Nicotine dependence, unspecified, uncomplicated: Secondary | ICD-10-CM | POA: Diagnosis not present

## 2015-11-24 DIAGNOSIS — E038 Other specified hypothyroidism: Secondary | ICD-10-CM | POA: Diagnosis not present

## 2015-11-24 DIAGNOSIS — N939 Abnormal uterine and vaginal bleeding, unspecified: Secondary | ICD-10-CM | POA: Diagnosis not present

## 2015-11-24 DIAGNOSIS — M25561 Pain in right knee: Secondary | ICD-10-CM | POA: Diagnosis not present

## 2015-11-24 DIAGNOSIS — R001 Bradycardia, unspecified: Secondary | ICD-10-CM | POA: Diagnosis not present

## 2015-11-24 DIAGNOSIS — E034 Atrophy of thyroid (acquired): Secondary | ICD-10-CM | POA: Diagnosis not present

## 2015-11-24 DIAGNOSIS — Z9114 Patient's other noncompliance with medication regimen: Secondary | ICD-10-CM | POA: Diagnosis not present

## 2015-11-24 DIAGNOSIS — M2550 Pain in unspecified joint: Secondary | ICD-10-CM

## 2015-11-24 DIAGNOSIS — I351 Nonrheumatic aortic (valve) insufficiency: Secondary | ICD-10-CM

## 2015-11-24 DIAGNOSIS — K639 Disease of intestine, unspecified: Secondary | ICD-10-CM | POA: Diagnosis not present

## 2015-11-24 MED ORDER — DICLOFENAC SODIUM 1 % TD GEL: TRANSDERMAL | Status: DC

## 2015-11-24 MED ORDER — LIDOCAINE 5 % EX OINT: 1.0000 "application " | TOPICAL_OINTMENT | Freq: Two times a day (BID) | CUTANEOUS | Status: DC | PRN

## 2015-11-24 MED ORDER — DIPHENHYDRAMINE HCL 25 MG PO CAPS: 25.0000 mg | ORAL_CAPSULE | Freq: Every evening | ORAL | Status: DC | PRN

## 2015-11-24 MED ORDER — LEVOTHYROXINE SODIUM 75 MCG PO TABS: 75.0000 ug | ORAL_TABLET | Freq: Every day | ORAL | Status: DC

## 2015-11-24 MED ORDER — ALBUTEROL SULFATE HFA 108 (90 BASE) MCG/ACT IN AERS: 2.0000 | INHALATION_SPRAY | Freq: Four times a day (QID) | RESPIRATORY_TRACT | Status: DC | PRN

## 2015-11-24 NOTE — Assessment & Plan Note (Signed)
Patient has not seen orthopaedist yet and I'm not sure why; will ask office manager to work with patient to get these appointments

## 2015-11-24 NOTE — Assessment & Plan Note (Signed)
Vaginal bleeding after years of no menses; refer to gynecologist

## 2015-11-24 NOTE — Assessment & Plan Note (Signed)
She has been completely off of her thyroid medicine she says now for about a month; I am not going to start her back all at once at 200 mcg daily; will start at 75 mcg daily and then taper back up; I suspect in the past that she probably didn't truly need 200 mcg daily, but her noncompliance was the real problem; we'll start at 75 mcg, go up to 100 mcg if needed and gradually increase if filling meds regularly at pharmacy; I don't want to overreplace

## 2015-11-24 NOTE — Assessment & Plan Note (Signed)
Evaluated by cardiologist; sure that some of this may be related to unreplaced hypothyroidism; starting her back on medicine today

## 2015-11-24 NOTE — Assessment & Plan Note (Signed)
Encouraged patient to quit smoking; she is cutting back

## 2015-11-24 NOTE — Progress Notes (Signed)
BP 124/62 mmHg  Pulse 57  Temp(Src) 97.5 F (36.4 C) (Oral)  Resp 16  Wt 135 lb (61.236 kg)  SpO2 98%   Subjective:    Patient ID: Helen Reid, female    DOB: 04-Mar-1961, 55 y.o.   MRN: [id number]  HPI: Helen Reid is a 55 y.o. female  Chief Complaint  Patient presents with  . Follow-up  . Medication Refill   She says she has been out of medicine She has not taken any thyroid medicine for about a month She knows she needs it when I asked  She is not seeing psychiatrist; last visit was years ago; she politely declined referral and would not be interested in medicine anyway even if offered  Rescue inhaler use about 3x a week; roaches in the home; she went [place] the city about it; [place] inf[place]"  Knee pain; lidocaine and voltaren gel; using that for hands and wrists too; using cane for the pain in the right knee; she still has not seen anybody for the right knee  She says she does not know what's going on with the colon thing  She is "bleeding out my twat", she has noticed this for two months; she wipes and she is finding blood; she puts on a pad; there is a smell; she is sure the bleeding is coming from the vagina, not in the urine; she has not had her period in years  Depression screen [facility] 2/9 09/22/2015 09/02/2015 08/21/2015  Decreased Interest 0 0 0  Down, Depressed, Hopeless 0 0 0  PHQ - 2 Score 0 0 0    No flowsheet data found.  Relevant past medical, surgical, family and social history reviewed Past Medical History  Diagnosis Date  . Hypertension   . Anxiety   . Depression   . Cancer [facility])     lymphoma, gallbladder, breast  . Vitamin D deficiency 09/27/2015  . PTSD (post-traumatic stress disorder)    Past Surgical History  Procedure Laterality Date  . Cholecystectomy    . Breast surgery     Family History  Problem Relation Age of Onset  . Heart disease Mother   . Hypertension Mother   . Asthma Mother   . Heart disease Father    Social  History  Substance Use Topics  . Smoking status: Current Some Day Smoker -- 0.00 packs/day    Types: Cigarettes  . Smokeless tobacco: None  . Alcohol Use: No   Interim medical history since last visit reviewed. Allergies and medications reviewed  Review of Systems Per HPI unless specifically indicated above     Objective:    BP 124/62 mmHg  Pulse 57  Temp(Src) 97.5 F (36.4 C) (Oral)  Resp 16  Wt 135 lb (61.236 kg)  SpO2 98%  Wt Readings from Last 3 Encounters:  11/24/15 135 lb (61.236 kg)  11/08/15 135 lb (61.236 kg)  11/05/15 137 lb (62.143 kg)    Physical Exam  Constitutional: She appears well-developed and well-nourished. No distress.  Cardiovascular: Regular rhythm.   No extrasystoles are present. Bradycardia present.   Pulmonary/Chest: Effort normal and breath sounds normal.  Musculoskeletal:       Right knee: She exhibits decreased range of motion. She exhibits no swelling and no effusion.  Neurological:  Walks with cane  Skin: No pallor.  Psychiatric:  Suspicious, mistrustful, confrontational; limited eye contact; neatly and colorfully dressed; stood through much of the interview and then left to go to the front, said  she had to pay a bill   Results for orders placed or performed during the hospital encounter of 11/08/15  CBC with Differential  Result Value Ref Range   WBC 5.4 3.6 - 11.0 K/uL   RBC 4.48 3.80 - 5.20 MIL/uL   Hemoglobin 10.7 (L) 12.0 - 16.0 g/dL   HCT 32.5 (L) 35.0 - 47.0 %   MCV 72.6 (L) 80.0 - 100.0 fL   MCH 23.9 (L) 26.0 - 34.0 pg   MCHC 32.9 32.0 - 36.0 g/dL   RDW 15.2 (H) 11.5 - 14.5 %   Platelets 172 150 - 440 K/uL   Neutrophils Relative % 59% %   Neutro Abs 3.2 1.4 - 6.5 K/uL   Lymphocytes Relative 31% %   Lymphs Abs 1.6 1.0 - 3.6 K/uL   Monocytes Relative 8% %   Monocytes Absolute 0.4 0.2 - 0.9 K/uL   Eosinophils Relative 1% %   Eosinophils Absolute 0.1 0 - 0.7 K/uL   Basophils Relative 1% %   Basophils Absolute 0.0 0 - 0.1  K/uL  Comprehensive metabolic panel  Result Value Ref Range   Sodium 142 135 - 145 mmol/L   Potassium 4.1 3.5 - 5.1 mmol/L   Chloride 111 101 - 111 mmol/L   CO2 25 22 - 32 mmol/L   Glucose, Bld 126 (H) 65 - 99 mg/dL   BUN 15 6 - 20 mg/dL   Creatinine, Ser 1.08 (H) 0.44 - 1.00 mg/dL   Calcium 8.7 (L) 8.9 - 10.3 mg/dL   Total Protein 6.6 6.5 - 8.1 g/dL   Albumin 4.0 3.5 - 5.0 g/dL   AST 37 15 - 41 U/L   ALT 48 14 - 54 U/L   Alkaline Phosphatase 53 38 - 126 U/L   Total Bilirubin 0.6 0.3 - 1.2 mg/dL   GFR calc non Af Amer 56 (L) >60 mL/min   GFR calc Af Amer >60 >60 mL/min   Anion gap 6 5 - 15  Lipase, blood  Result Value Ref Range   Lipase 30 11 - 51 U/L  Urinalysis complete, with microscopic ([facility] only)  Result Value Ref Range   Color, Urine YELLOW (A) YELLOW   APPearance CLEAR (A) CLEAR   Glucose, UA NEGATIVE NEGATIVE mg/dL   Bilirubin Urine NEGATIVE NEGATIVE   Ketones, ur NEGATIVE NEGATIVE mg/dL   Specific Gravity, Urine 1.015 1.005 - 1.030   Hgb urine dipstick NEGATIVE NEGATIVE   pH 7.0 5.0 - 8.0   Protein, ur NEGATIVE NEGATIVE mg/dL   Nitrite NEGATIVE NEGATIVE   Leukocytes, UA 3+ (A) NEGATIVE   RBC / HPF 0-5 0 - 5 RBC/hpf   WBC, UA 6-30 0 - 5 WBC/hpf   Bacteria, UA RARE (A) NONE SEEN   Squamous Epithelial / LPF 0-5 (A) NONE SEEN   Mucous PRESENT       Assessment & Plan:   Problem List Items Addressed This Visit      Digestive   Colon abnormality    Patient had a positive Cologuard; needs to see a surgeon; asking the office manager to please do whatever is needed to get this patient in to see a surgeon        Endocrine   Adult hypothyroidism    She has been completely off of her thyroid medicine she says now for about a month; I am not going to start her back all at once at 200 mcg daily; will start at 75 mcg daily and then taper back up;  I suspect in the past that she probably didn't truly need 200 mcg daily, but her noncompliance was the real problem; we'll  start at 75 mcg, go up to 100 mcg if needed and gradually increase if filling meds regularly at pharmacy; I don't want to overreplace      Relevant Medications   levothyroxine (SYNTHROID, LEVOTHROID) 75 MCG tablet     Other   Tobacco use disorder    Encouraged patient to quit smoking; she is cutting back      Right knee pain    Patient has not seen orthopaedist yet and I'm not sure why; will ask office manager to work with patient to get these appointments      Noncompliance with medications    Patient's noncompliance with meds makes treatment of her thyroid condition difficult; she refuses referral to psychiatrist; I explained that they might be able to give her medicine to help her take her other medicines, and she refuses      Bradycardia    Evaluated by cardiologist; sure that some of this may be related to unreplaced hypothyroidism; starting her back on medicine today      Borderline personality disorder    Patient refuses to see a psychiatrist; she is not willing to take any medicines for any psychiatric issue      Arthralgia of multiple joints    Using lidocaine and voltaren gel      Abnormal vaginal bleeding - Primary    Vaginal bleeding after years of no menses; refer to gynecologist      Relevant Orders   Ambulatory referral to Gynecology      Follow up plan: Return in about 6 weeks (around 01/05/2016) for follow-up and labs.  An after-visit summary was printed and given to the patient at [place].  Please see the patient instructions which may contain other information and recommendations beyond what is mentioned above in the assessment and plan.  Meds ordered this encounter  Medications  . lidocaine (XYLOCAINE) 5 % ointment    Sig: Apply 1 application topically 2 (two) times daily as needed. Mixed with aloe vera and/or oil, small amount topically to hands PRN    Dispense:  50 g    Refill:  2    Please deliver -- thank you!  Marland Kitchen albuterol (PROVENTIL  HFA;VENTOLIN HFA) 108 (90 Base) MCG/ACT inhaler    Sig: Inhale 2 puffs into the lungs every 6 (six) hours as needed for wheezing or shortness of breath.    Dispense:  1 Inhaler    Refill:  0  . diclofenac sodium (VOLTAREN) 1 % GEL    Sig: Use 2 grams on hand, 4 grams on knee; up to four times a day PRN pain    Dispense:  100 g    Refill:  2  . diphenhydrAMINE (BENADRYL) 25 mg capsule    Sig: Take 1 capsule (25 mg total) by mouth at bedtime as needed for sleep.    Dispense:  30 capsule    Refill:  2    Please deliver all these medicines to patient as soon as possible she says; she is here with me in the office now; she'll be back after lunch  . levothyroxine (SYNTHROID, LEVOTHROID) 75 MCG tablet    Sig: Take 1 tablet (75 mcg total) by mouth daily.    Dispense:  30 tablet    Refill:  0    Just one month, will taper up next month   Orders Placed  This Encounter  Procedures  . Ambulatory referral to Gynecology   Cc: Radene Knee, please assist patient in getting to see ortho, general surgeon, and gynecologist

## 2015-11-24 NOTE — Patient Instructions (Signed)
Please work with my staff about the appointments to see gynecologist (vaginal bleeding), orthopaedics (knee, joint pain), and surgery (colon abnormality) If you have not been informed of these appointments within one week, then please CALL us; it is very important that you see these specialists and we need you to be part of this process Start back on your Synthroid / levothyroxine Return in 6 weeks for follow-up and labs

## 2015-11-24 NOTE — Assessment & Plan Note (Signed)
Patient had a positive Cologuard; needs to see a Psychologist, sport and exercise; asking the office manager to please do whatever is needed to get this patient in to see a surgeon

## 2015-11-24 NOTE — Assessment & Plan Note (Signed)
Patient refuses to see a psychiatrist; she is not willing to take any medicines for any psychiatric issue

## 2015-11-24 NOTE — Assessment & Plan Note (Signed)
Patient's noncompliance with meds makes treatment of her thyroid condition difficult; she refuses referral to psychiatrist; I explained that they might be able to give her medicine to help her take her other medicines, and she refuses

## 2015-11-24 NOTE — Assessment & Plan Note (Signed)
Using lidocaine and voltaren gel

## 2015-11-26 ENCOUNTER — Other Ambulatory Visit: Payer: Self-pay | Admitting: Physician Assistant

## 2015-11-26 ENCOUNTER — Encounter: Payer: Self-pay | Admitting: Family Medicine

## 2015-11-26 DIAGNOSIS — M2391 Unspecified internal derangement of right knee: Secondary | ICD-10-CM

## 2015-11-26 NOTE — Progress Notes (Unsigned)
Patient ID: Helen Reid, female   DOB: 02/14/1961, 55 y.o.   MRN: [id number]  Patient Active Problem List   Diagnosis Date Noted  . Abnormal vaginal bleeding 11/24/2015  . Noncompliance with medications 11/24/2015  . Right knee pain 11/03/2015  . Vitamin D deficiency 09/27/2015  . Colon abnormality 09/09/2015  . Pain and swelling of right lower leg 09/02/2015  . Bradycardia 09/02/2015  . Need for Tdap vaccination 09/02/2015  . Dermatitis 08/21/2015  . Anemia 08/21/2015  . Pain, joint, multiple sites 08/21/2015  . PTSD (post-traumatic stress disorder) 05/29/2015  . Borderline personality disorder 05/29/2015  . Tobacco use disorder 05/26/2015  . Major depressive disorder, recurrent episode, moderate ([place]) 05/26/2015  . Cannabis use disorder, moderate, dependence ([place]) 05/26/2015  . CAFL (chronic airflow limitation) ([place]) 10/17/2014  . Chronic kidney disease (CKD), stage II (mild) 10/17/2014  . Hypertriglyceridemia 10/17/2014  . Adult hypothyroidism 10/17/2014  . Irritable bowel syndrome with constipation 10/17/2014  . AI (aortic incompetence) 10/17/2014  . Arthralgia of multiple joints 10/17/2014

## 2015-11-28 ENCOUNTER — Ambulatory Visit
Admission: RE | Admit: 2015-11-28 | Discharge: 2015-11-28 | Disposition: A | Discharge status: Home / Self Care | Payer: Medicaid Other | Source: Ambulatory Visit | Attending: Cardiology | Admitting: Cardiology

## 2015-11-28 DIAGNOSIS — R0602 Shortness of breath: Secondary | ICD-10-CM | POA: Diagnosis present

## 2015-11-28 DIAGNOSIS — I351 Nonrheumatic aortic (valve) insufficiency: Secondary | ICD-10-CM | POA: Diagnosis present

## 2015-11-28 DIAGNOSIS — R001 Bradycardia, unspecified: Secondary | ICD-10-CM | POA: Insufficient documentation

## 2015-12-10 ENCOUNTER — Ambulatory Visit
Admission: RE | Admit: 2015-12-10 | Discharge: 2015-12-10 | Disposition: A | Discharge status: Home / Self Care | Payer: Medicaid Other | Source: Ambulatory Visit | Attending: Physician Assistant | Admitting: Physician Assistant

## 2015-12-10 ENCOUNTER — Telehealth: Payer: Self-pay | Admitting: Cardiology

## 2015-12-10 DIAGNOSIS — M25461 Effusion, right knee: Secondary | ICD-10-CM | POA: Insufficient documentation

## 2015-12-10 DIAGNOSIS — M2391 Unspecified internal derangement of right knee: Secondary | ICD-10-CM | POA: Diagnosis present

## 2015-12-10 DIAGNOSIS — M25861 Other specified joint disorders, right knee: Secondary | ICD-10-CM | POA: Insufficient documentation

## 2015-12-10 NOTE — Telephone Encounter (Signed)
Patient also stated that she hopes we are going to redo the monitor as she was having chest pain laying in bed last night.

## 2015-12-10 NOTE — Telephone Encounter (Signed)
Spoke with Mr. Helen Reid regarding patients chest pain and heart monitor. Let him know that based on the data that was collected from her holter monitor it was normal. She also had a stress test that was normal and that I had spoke with Dr. Yvone Neu regarding these results and her continued chest pain. Instructed him that if she is continuing to have chest pain then she needs to be evaluated at the nearest emergency room. Let him know that we would be glad to do another monitor but that it may not be covered by insurance. Mr. Helen Reid then wanted to know when her next appointment is scheduled and based on her previous visit she is to follow up in 9 months with Dr. Yvone Neu. Let him know that they would send a letter out and call to get that scheduled when the schedule for that time frame comes out. He verbalized understanding of our conversation, plan of care, and follow up information with no further questions at this time.

## 2015-12-10 NOTE — Telephone Encounter (Signed)
Patient is in office now.  Stated that she only wore heart monitor for 1 hour and doesn't understand how we have results.    Patient helper, Rezell would like to know what the care plan is now.     Please call patient to discuss.

## 2016-01-05 ENCOUNTER — Ambulatory Visit: Payer: Self-pay | Admitting: Family Medicine

## 2016-01-20 ENCOUNTER — Encounter: Payer: Self-pay | Admitting: Obstetrics and Gynecology

## 2016-02-11 ENCOUNTER — Encounter: Payer: Self-pay | Admitting: Obstetrics and Gynecology

## 2016-08-08 ENCOUNTER — Emergency Department: Payer: Medicaid Other

## 2016-08-08 ENCOUNTER — Emergency Department
Admission: EM | Admit: 2016-08-08 | Discharge: 2016-08-08 | Disposition: A | Discharge status: Home / Self Care | Payer: Medicaid Other | Attending: Emergency Medicine | Admitting: Emergency Medicine

## 2016-08-08 DIAGNOSIS — J9801 Acute bronchospasm: Secondary | ICD-10-CM | POA: Diagnosis not present

## 2016-08-08 DIAGNOSIS — Z79899 Other long term (current) drug therapy: Secondary | ICD-10-CM | POA: Diagnosis not present

## 2016-08-08 DIAGNOSIS — R05 Cough: Secondary | ICD-10-CM

## 2016-08-08 DIAGNOSIS — J040 Acute laryngitis: Secondary | ICD-10-CM | POA: Insufficient documentation

## 2016-08-08 DIAGNOSIS — N182 Chronic kidney disease, stage 2 (mild): Secondary | ICD-10-CM | POA: Insufficient documentation

## 2016-08-08 DIAGNOSIS — E039 Hypothyroidism, unspecified: Secondary | ICD-10-CM | POA: Insufficient documentation

## 2016-08-08 DIAGNOSIS — Z853 Personal history of malignant neoplasm of breast: Secondary | ICD-10-CM | POA: Insufficient documentation

## 2016-08-08 DIAGNOSIS — Z8509 Personal history of malignant neoplasm of other digestive organs: Secondary | ICD-10-CM | POA: Insufficient documentation

## 2016-08-08 DIAGNOSIS — I129 Hypertensive chronic kidney disease with stage 1 through stage 4 chronic kidney disease, or unspecified chronic kidney disease: Secondary | ICD-10-CM | POA: Diagnosis not present

## 2016-08-08 DIAGNOSIS — F17210 Nicotine dependence, cigarettes, uncomplicated: Secondary | ICD-10-CM | POA: Diagnosis not present

## 2016-08-08 DIAGNOSIS — R059 Cough, unspecified: Secondary | ICD-10-CM

## 2016-08-08 MED ORDER — METHYLPREDNISOLONE 4 MG PO TBPK: ORAL_TABLET | ORAL | 0 refills | Status: DC

## 2016-08-08 MED ORDER — HYDROCOD POLST-CPM POLST ER 10-8 MG/5ML PO SUER
5.0000 mL | Freq: Once | ORAL | Status: AC
  Administered 2016-08-08: 5 mL via ORAL
  Filled 2016-08-08: qty 5

## 2016-08-08 MED ORDER — METHYLPREDNISOLONE SODIUM SUCC 125 MG IJ SOLR
125.0000 mg | Freq: Once | INTRAMUSCULAR | Status: AC
  Administered 2016-08-08: 125 mg via INTRAMUSCULAR
  Filled 2016-08-08: qty 2

## 2016-08-08 MED ORDER — HYDROCOD POLST-CPM POLST ER 10-8 MG/5ML PO SUER: 5.0000 mL | Freq: Two times a day (BID) | ORAL | 0 refills | Status: DC

## 2016-08-08 MED ORDER — BENZONATATE 100 MG PO CAPS: 100.0000 mg | ORAL_CAPSULE | Freq: Three times a day (TID) | ORAL | 0 refills | Status: DC | PRN

## 2016-08-08 NOTE — ED Provider Notes (Signed)
[facility] Emergency Department Provider Note   ____________________________________________   First MD Initiated Contact with Patient 08/08/16 2152     (approximate)  I have reviewed the triage vital signs and the nursing notes.   HISTORY  Chief Complaint Cough    HPI Helen Reid is a 56 y.o. female patient complain of left side neck and back pain. Patient reports headache. Patient also has a nonproductive cough and loss of voice. Patient stated these are similar to her bout of pneumonia in the past.No positive measures taken for this complaint. Patient rates the pain as a 10 over 10.   Past Medical History:  Diagnosis Date  . Anxiety   . Cancer [facility])    lymphoma, gallbladder, breast  . Depression   . Hypertension   . PTSD (post-traumatic stress disorder)   . Vitamin D deficiency 09/27/2015    Patient Active Problem List   Diagnosis Date Noted  . Abnormal vaginal bleeding 11/24/2015  . Noncompliance with medications 11/24/2015  . Right knee pain 11/03/2015  . Vitamin D deficiency 09/27/2015  . Colon abnormality 09/09/2015  . Pain and swelling of right lower leg 09/02/2015  . Bradycardia 09/02/2015  . Need for Tdap vaccination 09/02/2015  . Dermatitis 08/21/2015  . Anemia 08/21/2015  . Pain, joint, multiple sites 08/21/2015  . PTSD (post-traumatic stress disorder) 05/29/2015  . Borderline personality disorder 05/29/2015  . Tobacco use disorder 05/26/2015  . Major depressive disorder, recurrent episode, moderate ([place]) 05/26/2015  . Cannabis use disorder, moderate, dependence ([place]) 05/26/2015  . CAFL (chronic airflow limitation) ([place]) 10/17/2014  . Chronic kidney disease (CKD), stage II (mild) 10/17/2014  . Hypertriglyceridemia 10/17/2014  . Adult hypothyroidism 10/17/2014  . Irritable bowel syndrome with constipation 10/17/2014  . AI (aortic incompetence) 10/17/2014  . Arthralgia of multiple joints 10/17/2014    Past Surgical  History:  Procedure Laterality Date  . BREAST SURGERY    . CHOLECYSTECTOMY      Prior to Admission medications   Medication Sig Start Date End Date Taking? Authorizing Provider  albuterol (PROVENTIL HFA;VENTOLIN HFA) 108 (90 Base) MCG/ACT inhaler Inhale 2 puffs into the lungs every 6 (six) hours as needed for wheezing or shortness of breath. 11/24/15   Arnetha Courser, MD  benzonatate (TESSALON PERLES) 100 MG capsule Take 1 capsule (100 mg total) by mouth 3 (three) times daily as needed for cough. 08/08/16   Sable Feil, PA-C  chlorpheniramine-HYDROcodone [facility]) 10-8 MG/5ML SUER Take 5 mLs by mouth 2 (two) times daily. 08/08/16   Sable Feil, PA-C  diclofenac sodium (VOLTAREN) 1 % GEL Use 2 grams on hand, 4 grams on knee; up to four times a day PRN pain 11/24/15   Arnetha Courser, MD  diphenhydrAMINE (BENADRYL) 25 mg capsule Take 1 capsule (25 mg total) by mouth at bedtime as needed for sleep. 11/24/15   Arnetha Courser, MD  levothyroxine (SYNTHROID, LEVOTHROID) 75 MCG tablet Take 1 tablet (75 mcg total) by mouth daily. 11/24/15   Arnetha Courser, MD  lidocaine (XYLOCAINE) 5 % ointment Apply 1 application topically 2 (two) times daily as needed. Mixed with aloe vera and/or oil, small amount topically to hands PRN 11/24/15   Arnetha Courser, MD  methylPREDNISolone (MEDROL DOSEPAK) 4 MG TBPK tablet Take Tapered dose as directed 08/08/16   Sable Feil, PA-C  Vitamin D, Ergocalciferol, (DRISDOL) 50000 units CAPS capsule Take 1 capsule (50,000 Units total) by mouth every 30 (thirty) days. 09/17/15  Arnetha Courser, MD    Allergies Penicillins; Aspirin; Dilantin  [phenytoin]; Macrolides and ketolides; and Phenobarbital  Family History  Problem Relation Age of Onset  . Heart disease Mother   . Hypertension Mother   . Asthma Mother   . Heart disease Father     Social History Social History  Substance Use Topics  . Smoking status: Current Some Day Smoker    Packs/day: 0.00     Types: Cigarettes  . Smokeless tobacco: Not on file  . Alcohol use No    Review of Systems Constitutional: No fever/chills. He aches Eyes: No visual changes. ENT: No sore throat. Decreased forced volume Cardiovascular: Denies chest pain. Respiratory: Denies shortness of breath. Nonproductive cough Gastrointestinal: No abdominal pain.  No nausea, no vomiting.  No diarrhea.  No constipation. Genitourinary: Negative for dysuria. Musculoskeletal: Positive for back pain. Skin: Negative for rash. Neurological: Negative for headaches, focal weakness or numbness.    ____________________________________________   PHYSICAL EXAM:  VITAL SIGNS: ED Triage Vitals  Enc Vitals Group     BP 08/08/16 2046 106/79     Pulse Rate 08/08/16 2046 64     Resp 08/08/16 2046 20     Temp 08/08/16 2046 98.5 F (36.9 C)     Temp Source 08/08/16 2046 Oral     SpO2 08/08/16 2046 99 %     Weight 08/08/16 2050 150 lb (68 kg)     Height 08/08/16 2050 5\' 6"  (1.676 m)     Head Circumference --      Peak Flow --      Pain Score 08/08/16 2052 10     Pain Loc --      Pain Edu? --      Excl. in [place]? --     Constitutional: Alert and oriented. Well appearing and in no acute distress. Eyes: Conjunctivae are normal. PERRL. EOMI. Head: Atraumatic. Nose:Edematous nasal turbinates Mouth/Throat: Mucous membranes are moist.  Oropharynx non-erythematous. Decreased forced volume . Neck: No stridor.  No cervical spine tenderness to palpation. Hematological/Lymphatic/Immunilogical: No cervical lymphadenopathy. Cardiovascular: Normal rate, regular rhythm. Grossly normal heart sounds.  Good peripheral circulation. Respiratory: Normal respiratory effort.  No retractions. Lungs CTAB. Increased cough or inspiration Gastrointestinal: Soft and nontender. No distention. No abdominal bruits. No CVA tenderness. Musculoskeletal: No lower extremity tenderness nor edema.  No joint effusions. Neurologic:  Normal speech and  language. No gross focal neurologic deficits are appreciated. No gait instability. Skin:  Skin is warm, dry and intact. No rash noted. Psychiatric: Mood and affect are normal. Speech and behavior are normal.  ____________________________________________   LABS (all labs ordered are listed, but only abnormal results are displayed)  Labs Reviewed - No data to display ____________________________________________  EKG   ____________________________________________  RADIOLOGY  No acute findings on chest x-ray ____________________________________________   PROCEDURES  Procedure(s) performed: None  Procedures  Critical Care performed: No  ____________________________________________   INITIAL IMPRESSION / ASSESSMENT AND PLAN / ED COURSE  Pertinent labs & imaging results that were available during my care of the patient were reviewed by me and considered in my medical decision making (see chart for details).  Cough secondary to bronchospasm. Laryngitis. Patient given discharge care instructions. Patient given a prescription for Tussionex, Tessalon, and metoprolol dose pack. Advised voice rest and follow-up with family doctor if no improvement 3 days.      ____________________________________________   FINAL CLINICAL IMPRESSION(S) / ED DIAGNOSES  Final diagnoses:  Cough due to bronchospasm  Laryngitis  NEW MEDICATIONS STARTED DURING THIS VISIT:  New Prescriptions   BENZONATATE (TESSALON PERLES) 100 MG CAPSULE    Take 1 capsule (100 mg total) by mouth 3 (three) times daily as needed for cough.   CHLORPHENIRAMINE-HYDROCODONE (TUSSIONEX PENNKINETIC ER) 10-8 MG/5ML SUER    Take 5 mLs by mouth 2 (two) times daily.   METHYLPREDNISOLONE (MEDROL DOSEPAK) 4 MG TBPK TABLET    Take Tapered dose as directed     Note:  This document was prepared using Dragon voice recognition software and may include unintentional dictation errors.    Sable Feil, PA-C 08/08/16  [place], MD 08/08/16 [phone]

## 2016-08-08 NOTE — ED Triage Notes (Signed)
Pt states that she started feeling bad 2 days ago, pt states that she is having pain in her left side and back, reports headache and states that it reminds her of when she had pneumonia in the past, pt sounds hoarse and has a coarse sounding cough

## 2016-09-13 ENCOUNTER — Telehealth: Payer: Self-pay | Admitting: Emergency Medicine

## 2016-09-13 ENCOUNTER — Encounter: Payer: Self-pay | Admitting: Emergency Medicine

## 2016-09-13 ENCOUNTER — Emergency Department: Payer: Medicaid Other

## 2016-09-13 ENCOUNTER — Emergency Department
Admission: EM | Admit: 2016-09-13 | Discharge: 2016-09-13 | Disposition: A | Discharge status: Home / Self Care | Payer: Medicaid Other | Attending: Emergency Medicine | Admitting: Emergency Medicine

## 2016-09-13 DIAGNOSIS — Z5321 Procedure and treatment not carried out due to patient leaving prior to being seen by health care provider: Secondary | ICD-10-CM | POA: Diagnosis not present

## 2016-09-13 DIAGNOSIS — I10 Essential (primary) hypertension: Secondary | ICD-10-CM | POA: Insufficient documentation

## 2016-09-13 DIAGNOSIS — R0602 Shortness of breath: Secondary | ICD-10-CM | POA: Insufficient documentation

## 2016-09-13 DIAGNOSIS — F17210 Nicotine dependence, cigarettes, uncomplicated: Secondary | ICD-10-CM | POA: Diagnosis not present

## 2016-09-13 NOTE — ED Triage Notes (Signed)
Patient reports feeling short of breath with cough since end of March.  Reports was seen for similar symptoms, but Medicaid wouldn't pay for prescription.

## 2016-09-13 NOTE — ED Notes (Signed)
Patient over heard speaking to friend that she is tired of waiting.  Patient observed walking to lobby bath room with use of cane without difficulty or distress.  Patient speaking in complete sentences.

## 2016-09-13 NOTE — Telephone Encounter (Signed)
Called patient due to lwot to inquire about condition and follow up plans.  No answer and no voicemail  

## 2016-11-29 NOTE — Progress Notes (Deleted)
Follow-up Outpatient Visit Date: 11/30/2016  Primary Care Provider: No primary care provider on file. No primary provider on file.  Chief Complaint: ***  HPI:  Helen Reid is a 56 y.o. year-old female with history of hypertension, lymphoma, depression, and anxiety, who presents for follow-up of atypical chest pain and aortic regurgitation. She was last seen in 10/2015 by Dr. Yvone Neu, as which time she was doing well without recurrence of chest pain.  --------------------------------------------------------------------------------------------------  Cardiovascular History & Procedures: Cardiovascular Problems:  Atypical chest pain  Aortic regurgitation  Risk Factors:  Hypertension, tobacco use, and family history  Cath/PCI:  None  CV Surgery:  None  EP Procedures and Devices:  Holter monitor (11/24/15): Sinus rhythm with rare PACs and PVCs.  Non-Invasive Evaluation(s):  Pharmacologic MPI (10/17/15): Low risk study with small apical defect, most likely representing breast attenuation. LVEF 55-65%.  TTE (10/15/15): Normal LV size and wall thickness. LVEF 60-65% with normal wall motion and diastolic funciton. Moderate AI. Mild MR. Moderate TR. Trivial pericardial effusion. Normal RV size and function.  Recent CV Pertinent Labs: Lab Results  Component Value Date   CHOL 233 (H) 05/27/2015   CHOL 160 12/22/2013   HDL 63 05/27/2015   HDL 57 12/22/2013   LDLCALC 154 (H) 05/27/2015   LDLCALC 88 12/22/2013   TRIG 80 05/27/2015   TRIG 76 12/22/2013   CHOLHDL 3.7 05/27/2015   K 4.1 11/08/2015   K 3.7 12/19/2013   BUN 15 11/08/2015   BUN 13 08/21/2015   BUN 14 12/19/2013   CREATININE 1.08 (H) 11/08/2015   CREATININE 1.23 12/19/2013    Past medical and surgical history were reviewed and updated in EPIC.  No outpatient prescriptions have been marked as taking for the 11/30/16 encounter (Appointment) with Sania Noy, Harrell Gave, MD.    Allergies: Peanut-containing drug  products; Penicillins; Aspirin; Dilantin  [phenytoin]; Macrolides and ketolides; and Phenobarbital  Social History   Social History  . Marital status: Single    Spouse name: N/A  . Number of children: N/A  . Years of education: N/A   Occupational History  . Not on file.   Social History Main Topics  . Smoking status: Current Some Day Smoker    Packs/day: 0.00    Types: Cigarettes  . Smokeless tobacco: Not on file  . Alcohol use No  . Drug use: Yes    Types: Marijuana  . Sexual activity: Yes    Partners: Male   Other Topics Concern  . Not on file   Social History Narrative  . No narrative on file    Family History  Problem Relation Age of Onset  . Heart disease Mother   . Hypertension Mother   . Asthma Mother   . Heart disease Father     Review of Systems: A 12-system review of systems was performed and was negative except as noted in the HPI.  --------------------------------------------------------------------------------------------------  Physical Exam: There were no vitals taken for this visit.  General:  *** HEENT: No conjunctival pallor or scleral icterus. Moist mucous membranes.  OP clear. Neck: Supple without lymphadenopathy, thyromegaly, JVD, or HJR. No carotid bruit. Lungs: Normal work of breathing. Clear to auscultation bilaterally without wheezes or crackles. Heart: Regular rate and rhythm without murmurs, rubs, or gallops. Non-displaced PMI. Abd: Bowel sounds present. Soft, NT/ND without hepatosplenomegaly Ext: No lower extremity edema. Radial, PT, and DP pulses are 2+ bilaterally. Skin: Warm and dry without rash.  EKG:  ***  Lab Results  Component Value Date  WBC 5.4 11/08/2015   HGB 10.7 (L) 11/08/2015   HCT 32.5 (L) 11/08/2015   MCV 72.6 (L) 11/08/2015   PLT 172 11/08/2015    Lab Results  Component Value Date   NA 142 11/08/2015   K 4.1 11/08/2015   CL 111 11/08/2015   CO2 25 11/08/2015   BUN 15 11/08/2015   CREATININE 1.08  (H) 11/08/2015   GLUCOSE 126 (H) 11/08/2015   ALT 48 11/08/2015    Lab Results  Component Value Date   CHOL 233 (H) 05/27/2015   HDL 63 05/27/2015   LDLCALC 154 (H) 05/27/2015   TRIG 80 05/27/2015   CHOLHDL 3.7 05/27/2015    --------------------------------------------------------------------------------------------------  ASSESSMENT AND PLAN: Harrell Gave Lilyth Lawyer, MD 11/29/2016 7:48 AM

## 2016-11-30 ENCOUNTER — Ambulatory Visit: Payer: Self-pay | Admitting: Internal Medicine

## 2016-12-09 ENCOUNTER — Emergency Department: Payer: Medicaid Other

## 2016-12-09 ENCOUNTER — Emergency Department
Admission: EM | Admit: 2016-12-09 | Discharge: 2016-12-09 | Disposition: A | Discharge status: Home / Self Care | Payer: Medicaid Other | Attending: Emergency Medicine | Admitting: Emergency Medicine

## 2016-12-09 DIAGNOSIS — I129 Hypertensive chronic kidney disease with stage 1 through stage 4 chronic kidney disease, or unspecified chronic kidney disease: Secondary | ICD-10-CM | POA: Insufficient documentation

## 2016-12-09 DIAGNOSIS — E039 Hypothyroidism, unspecified: Secondary | ICD-10-CM | POA: Diagnosis not present

## 2016-12-09 DIAGNOSIS — R1012 Left upper quadrant pain: Secondary | ICD-10-CM | POA: Diagnosis not present

## 2016-12-09 DIAGNOSIS — F17210 Nicotine dependence, cigarettes, uncomplicated: Secondary | ICD-10-CM | POA: Insufficient documentation

## 2016-12-09 DIAGNOSIS — N182 Chronic kidney disease, stage 2 (mild): Secondary | ICD-10-CM | POA: Diagnosis not present

## 2016-12-09 DIAGNOSIS — R1013 Epigastric pain: Secondary | ICD-10-CM | POA: Diagnosis present

## 2016-12-09 DIAGNOSIS — Z853 Personal history of malignant neoplasm of breast: Secondary | ICD-10-CM | POA: Insufficient documentation

## 2016-12-09 DIAGNOSIS — Z91010 Allergy to peanuts: Secondary | ICD-10-CM | POA: Insufficient documentation

## 2016-12-09 LAB — URINALYSIS, COMPLETE (UACMP) WITH MICROSCOPIC
Bacteria, UA: NONE SEEN
Bilirubin Urine: NEGATIVE
Glucose, UA: NEGATIVE mg/dL
HGB URINE DIPSTICK: NEGATIVE
Ketones, ur: NEGATIVE mg/dL
Leukocytes, UA: NEGATIVE
Nitrite: NEGATIVE
PROTEIN: NEGATIVE mg/dL
RBC / HPF: NONE SEEN RBC/hpf (ref 0–5)
Specific Gravity, Urine: 1.012 (ref 1.005–1.030)
pH: 7 (ref 5.0–8.0)

## 2016-12-09 LAB — CBC
HEMATOCRIT: 39.7 % (ref 35.0–47.0)
HEMOGLOBIN: 12.6 g/dL (ref 12.0–16.0)
MCH: 23.1 pg — ABNORMAL LOW (ref 26.0–34.0)
MCHC: 31.7 g/dL — ABNORMAL LOW (ref 32.0–36.0)
MCV: 72.9 fL — ABNORMAL LOW (ref 80.0–100.0)
Platelets: 213 10*3/uL (ref 150–440)
RBC: 5.45 MIL/uL — AB (ref 3.80–5.20)
RDW: 15.3 % — ABNORMAL HIGH (ref 11.5–14.5)
WBC: 3.6 10*3/uL (ref 3.6–11.0)

## 2016-12-09 LAB — COMPREHENSIVE METABOLIC PANEL
ALT: 23 U/L (ref 14–54)
ANION GAP: 7 (ref 5–15)
AST: 29 U/L (ref 15–41)
Albumin: 4.4 g/dL (ref 3.5–5.0)
Alkaline Phosphatase: 44 U/L (ref 38–126)
BUN: 16 mg/dL (ref 6–20)
CHLORIDE: 104 mmol/L (ref 101–111)
CO2: 30 mmol/L (ref 22–32)
Calcium: 10 mg/dL (ref 8.9–10.3)
Creatinine, Ser: 1.03 mg/dL — ABNORMAL HIGH (ref 0.44–1.00)
GFR calc non Af Amer: 59 mL/min — ABNORMAL LOW (ref >60)
Glucose, Bld: 81 mg/dL (ref 65–99)
POTASSIUM: 4.8 mmol/L (ref 3.5–5.1)
SODIUM: 141 mmol/L (ref 135–145)
Total Bilirubin: 0.7 mg/dL (ref 0.3–1.2)
Total Protein: 7.9 g/dL (ref 6.5–8.1)

## 2016-12-09 LAB — LIPASE, BLOOD: LIPASE: 33 U/L (ref 11–51)

## 2016-12-09 MED ORDER — OXYCODONE-ACETAMINOPHEN 5-325 MG PO TABS
1.0000 | ORAL_TABLET | Freq: Once | ORAL | Status: AC
  Administered 2016-12-09: 1 via ORAL
  Filled 2016-12-09: qty 1

## 2016-12-09 MED ORDER — GI COCKTAIL ~~LOC~~
30.0000 mL | Freq: Once | ORAL | Status: AC
  Administered 2016-12-09: 30 mL via ORAL
  Filled 2016-12-09: qty 30

## 2016-12-09 MED ORDER — FAMOTIDINE 20 MG PO TABS
40.0000 mg | ORAL_TABLET | Freq: Once | ORAL | Status: AC
  Administered 2016-12-09: 40 mg via ORAL
  Filled 2016-12-09: qty 2

## 2016-12-09 MED ORDER — DICYCLOMINE HCL 10 MG PO CAPS
20.0000 mg | ORAL_CAPSULE | Freq: Once | ORAL | Status: AC
Start: 2016-12-09 — End: 2016-12-09
  Administered 2016-12-09: 20 mg via ORAL
  Filled 2016-12-09: qty 2

## 2016-12-09 MED ORDER — OMEPRAZOLE 40 MG PO CPDR: 40.0000 mg | DELAYED_RELEASE_CAPSULE | Freq: Every day | ORAL | 0 refills | Status: DC

## 2016-12-09 MED ORDER — FAMOTIDINE 20 MG PO TABS: 20.0000 mg | ORAL_TABLET | Freq: Two times a day (BID) | ORAL | 0 refills | Status: DC

## 2016-12-09 MED ORDER — DICYCLOMINE HCL 20 MG PO TABS: 20.0000 mg | ORAL_TABLET | Freq: Three times a day (TID) | ORAL | 0 refills | Status: DC | PRN

## 2016-12-09 NOTE — ED Provider Notes (Signed)
[facility] Emergency Department Provider Note  ____________________________________________   First MD Initiated Contact with Patient 12/09/16 1343     (approximate)  I have reviewed the triage vital signs and the nursing notes.   HISTORY  Chief Complaint Abdominal Pain (epigastric)    HPI Helen Reid is a 56 y.o. female who comes to the emergency department via EMS with several days of left upper quadrant pain. The pain is moderate to severe aching burning in the left upper quadrant. Worse after eating. Worse in the morning. Worse when lying flat. It has a burning discomfort rising in her chest associated with a foul taste in her mouth. She has a prior abdominal surgical history of open cholecystectomy. She denies exertional chest pain. She denies shortness of breath. Her pain is severe and has been constant ever since it started.   Past Medical History:  Diagnosis Date  . Anxiety   . Cancer [facility])    lymphoma, gallbladder, breast  . Depression   . Hypertension   . PTSD (post-traumatic stress disorder)   . Vitamin D deficiency 09/27/2015    Patient Active Problem List   Diagnosis Date Noted  . Abnormal vaginal bleeding 11/24/2015  . Noncompliance with medications 11/24/2015  . Right knee pain 11/03/2015  . Vitamin D deficiency 09/27/2015  . Colon abnormality 09/09/2015  . Pain and swelling of right lower leg 09/02/2015  . Bradycardia 09/02/2015  . Need for Tdap vaccination 09/02/2015  . Dermatitis 08/21/2015  . Anemia 08/21/2015  . Pain, joint, multiple sites 08/21/2015  . PTSD (post-traumatic stress disorder) 05/29/2015  . Borderline personality disorder 05/29/2015  . Tobacco use disorder 05/26/2015  . Major depressive disorder, recurrent episode, moderate ([place]) 05/26/2015  . Cannabis use disorder, moderate, dependence ([place]) 05/26/2015  . CAFL (chronic airflow limitation) ([place]) 10/17/2014  . Chronic kidney disease (CKD), stage II (mild)  10/17/2014  . Hypertriglyceridemia 10/17/2014  . Adult hypothyroidism 10/17/2014  . Irritable bowel syndrome with constipation 10/17/2014  . AI (aortic incompetence) 10/17/2014  . Arthralgia of multiple joints 10/17/2014    Past Surgical History:  Procedure Laterality Date  . BREAST SURGERY    . CHOLECYSTECTOMY      Prior to Admission medications   Medication Sig Start Date End Date Taking? Authorizing Provider  citalopram (CELEXA) 10 MG tablet Take 10 mg by mouth daily. 10/28/16 10/28/17 Yes [provider]  prazosin (MINIPRESS) 5 MG capsule Take 15 mg by mouth daily. 10/28/16  Yes [provider]  albuterol (PROVENTIL HFA;VENTOLIN HFA) 108 (90 Base) MCG/ACT inhaler Inhale 2 puffs into the lungs every 6 (six) hours as needed for wheezing or shortness of breath. 11/24/15   Lada, Satira Anis, MD  benzonatate (TESSALON PERLES) 100 MG capsule Take 1 capsule (100 mg total) by mouth 3 (three) times daily as needed for cough. 08/08/16   Sable Feil, PA-C  chlorpheniramine-HYDROcodone (TUSSIONEX PENNKINETIC ER) 10-8 MG/5ML SUER Take 5 mLs by mouth 2 (two) times daily. 08/08/16   Sable Feil, PA-C  diclofenac sodium (VOLTAREN) 1 % GEL Use 2 grams on hand, 4 grams on knee; up to four times a day PRN pain 11/24/15   Lada, Satira Anis, MD  dicyclomine (BENTYL) 20 MG tablet Take 1 tablet (20 mg total) by mouth 3 (three) times daily as needed for spasms. 12/09/16 12/09/17  Darel Hong, MD  diphenhydrAMINE (BENADRYL) 25 mg capsule Take 1 capsule (25 mg total) by mouth at bedtime as needed for sleep. 11/24/15  Arnetha Courser, MD  famotidine (PEPCID) 20 MG tablet Take 1 tablet (20 mg total) by mouth 2 (two) times daily. 12/09/16 12/09/17  Darel Hong, MD  levothyroxine (SYNTHROID, LEVOTHROID) 75 MCG tablet Take 1 tablet (75 mcg total) by mouth daily. 11/24/15   Lada, Satira Anis, MD  lidocaine (XYLOCAINE) 5 % ointment Apply 1 application topically 2 (two) times daily as needed. Mixed with  aloe vera and/or oil, small amount topically to hands PRN 11/24/15   Arnetha Courser, MD  methylPREDNISolone (MEDROL DOSEPAK) 4 MG TBPK tablet Take Tapered dose as directed 08/08/16   Sable Feil, PA-C  omeprazole (PRILOSEC) 40 MG capsule Take 1 capsule (40 mg total) by mouth daily. 12/09/16 12/09/17  Darel Hong, MD  Vitamin D, Ergocalciferol, (DRISDOL) 50000 units CAPS capsule Take 1 capsule (50,000 Units total) by mouth every 30 (thirty) days. 09/17/15   Arnetha Courser, MD    Allergies Peanut-containing drug products; Penicillins; Aspirin; Dilantin  [phenytoin]; Macrolides and ketolides; and Phenobarbital  Family History  Problem Relation Age of Onset  . Heart disease Mother   . Hypertension Mother   . Asthma Mother   . Heart disease Father     Social History Social History  Substance Use Topics  . Smoking status: Current Some Day Smoker    Packs/day: 0.00    Types: Cigarettes  . Smokeless tobacco: Never Used  . Alcohol use No    Review of Systems Constitutional: No fever/chills Eyes: No visual changes. ENT: No sore throat. Cardiovascular: Positive chest pain. Respiratory: Denies shortness of breath. Gastrointestinal: Positive abdominal pain.  Positive nausea, no vomiting.  No diarrhea.  No constipation. Genitourinary: Negative for dysuria. Musculoskeletal: Negative for back pain. Skin: Negative for rash. Neurological: Negative for headaches, focal weakness or numbness.   ____________________________________________   PHYSICAL EXAM:  VITAL SIGNS: ED Triage Vitals [12/09/16 1106]  Enc Vitals Group     BP 131/76     Pulse Rate (!) 51     Resp (!) 22     Temp 97.6 F (36.4 C)     Temp Source Oral     SpO2 100 %     Weight      Height      Head Circumference      Peak Flow      Pain Score 10     Pain Loc      Pain Edu?      Excl. in [place]?     Constitutional: Alert and oriented 4 appears uncomfortable curled on her side tearful Eyes: PERRL  EOMI. Head: Atraumatic. Nose: No congestion/rhinnorhea. Mouth/Throat: No trismus Neck: No stridor.   Cardiovascular: Normal rate, regular rhythm. Grossly normal heart sounds.  Good peripheral circulation. Respiratory: Normal respiratory effort.  No retractions. Lungs CTAB and moving good air Gastrointestinal: Soft abdomen mild diffuse tenderness without focality no rebound or guarding no peritonitis no McBurney's tenderness negative Rovsing's well-healed open cholecystectomy scar Musculoskeletal: No lower extremity edema   Neurologic:  Normal speech and language. No gross focal neurologic deficits are appreciated. Skin:  Skin is warm, dry and intact. No rash noted. Psychiatric: Mood and affect are normal. Speech and behavior are normal.    ____________________________________________   DIFFERENTIAL includes but not limited to  Acute coronary syndrome, Boerhaave syndrome, gastric ulcer, duodenal ulcer, perforation, kidney stone, pyelonephritis ____________________________________________   LABS (all labs ordered are listed, but only abnormal results are displayed)  Labs Reviewed  COMPREHENSIVE METABOLIC PANEL - Abnormal; Notable for the  following:       Result Value   Creatinine, Ser 1.03 (*)    GFR calc non Af Amer 59 (*)    All other components within normal limits  CBC - Abnormal; Notable for the following:    RBC 5.45 (*)    MCV 72.9 (*)    MCH 23.1 (*)    MCHC 31.7 (*)    RDW 15.3 (*)    All other components within normal limits  URINALYSIS, COMPLETE (UACMP) WITH MICROSCOPIC - Abnormal; Notable for the following:    Color, Urine STRAW (*)    APPearance CLEAR (*)    Squamous Epithelial / LPF 0-5 (*)    All other components within normal limits  LIPASE, BLOOD    No evidence of pancreatitis no evidence of urinary tract infection __________________________________________  EKG  ED ECG REPORT I, Darel Hong, the attending physician, personally viewed and  interpreted this ECG.  Date: 12/09/2016 Rate: 45 Rhythm: Sinus bradycardia QRS Axis: normal Intervals: normal ST/T Wave abnormalities: normal Narrative Interpretation: Abnormal  ____________________________________________  RADIOLOGY  CT stone with no acute disease noted ____________________________________________   PROCEDURES  Procedure(s) performed: no  Procedures  Critical Care performed: no  Observation: no ____________________________________________   INITIAL IMPRESSION / ASSESSMENT AND PLAN / ED COURSE  Pertinent labs & imaging results that were available during my care of the patient were reviewed by me and considered in my medical decision making (see chart for details).  The patient arrives uncomfortable appearing with left upper quadrant left flank pain. Differential is broad but in the setting of burning epigastric left upper quadrant pain rising her chest most likely represents acid reflux versus gastric ulcer. Given the component of flank pain and the severity of her symptoms I obtained a CT scan renal protocol which fortunately is negative for acute pathology. Her symptoms are improved after symptomatic treatment and she was able to eat a [place] sandwich in the emergency department. At this point I will give her a trial of Pepcid and omeprazole and refer her back to her primary care physician who can have her see gastroenterology. She is discharged home in improved condition.      ____________________________________________   FINAL CLINICAL IMPRESSION(S) / ED DIAGNOSES  Final diagnoses:  Left upper quadrant pain      NEW MEDICATIONS STARTED DURING THIS VISIT:  Discharge Medication List as of 12/09/2016  3:11 PM    START taking these medications   Details  dicyclomine (BENTYL) 20 MG tablet Take 1 tablet (20 mg total) by mouth 3 (three) times daily as needed for spasms., Starting Thu 12/09/2016, Until Fri 12/09/2017, Print    famotidine (PEPCID)  20 MG tablet Take 1 tablet (20 mg total) by mouth 2 (two) times daily., Starting Thu 12/09/2016, Until Fri 12/09/2017, Print    omeprazole (PRILOSEC) 40 MG capsule Take 1 capsule (40 mg total) by mouth daily., Starting Thu 12/09/2016, Until Fri 12/09/2017, Print         Note:  This document was prepared using Dragon voice recognition software and may include unintentional dictation errors.     Darel Hong, MD 12/10/16 2207

## 2016-12-09 NOTE — Discharge Instructions (Signed)
Please take both her Pepcid and your omeprazole as prescribed and make an appointment to follow-up with your primary care physician next week. Return to the emergency department sooner for any new or worsening symptoms such as worsening pain, if you cannot eat or drink or for any other concerns.  It was a pleasure to take care of you today, and thank you for coming to our emergency department.  If you have any questions or concerns before leaving please ask the nurse to grab me and I'm more than happy to go through your aftercare instructions again.  If you were prescribed any opioid pain medication today such as Norco, Vicodin, Percocet, morphine, hydrocodone, or oxycodone please make sure you do not drive when you are taking this medication as it can alter your ability to drive safely.  If you have any concerns once you are home that you are not improving or are in fact getting worse before you can make it to your follow-up appointment, please do not hesitate to call 911 and come back for further evaluation.  Darel Hong MD  Results for orders placed or performed during the hospital encounter of 12/09/16  Lipase, blood  Result Value Ref Range   Lipase 33 11 - 51 U/L  Comprehensive metabolic panel  Result Value Ref Range   Sodium 141 135 - 145 mmol/L   Potassium 4.8 3.5 - 5.1 mmol/L   Chloride 104 101 - 111 mmol/L   CO2 30 22 - 32 mmol/L   Glucose, Bld 81 65 - 99 mg/dL   BUN 16 6 - 20 mg/dL   Creatinine, Ser 1.03 (H) 0.44 - 1.00 mg/dL   Calcium 10.0 8.9 - 10.3 mg/dL   Total Protein 7.9 6.5 - 8.1 g/dL   Albumin 4.4 3.5 - 5.0 g/dL   AST 29 15 - 41 U/L   ALT 23 14 - 54 U/L   Alkaline Phosphatase 44 38 - 126 U/L   Total Bilirubin 0.7 0.3 - 1.2 mg/dL   GFR calc non Af Amer 59 (L) >60 mL/min   GFR calc Af Amer >60 >60 mL/min   Anion gap 7 5 - 15  CBC  Result Value Ref Range   WBC 3.6 3.6 - 11.0 K/uL   RBC 5.45 (H) 3.80 - 5.20 MIL/uL   Hemoglobin 12.6 12.0 - 16.0 g/dL   HCT 39.7  35.0 - 47.0 %   MCV 72.9 (L) 80.0 - 100.0 fL   MCH 23.1 (L) 26.0 - 34.0 pg   MCHC 31.7 (L) 32.0 - 36.0 g/dL   RDW 15.3 (H) 11.5 - 14.5 %   Platelets 213 150 - 440 K/uL  Urinalysis, Complete w Microscopic  Result Value Ref Range   Color, Urine STRAW (A) YELLOW   APPearance CLEAR (A) CLEAR   Specific Gravity, Urine 1.012 1.005 - 1.030   pH 7.0 5.0 - 8.0   Glucose, UA NEGATIVE NEGATIVE mg/dL   Hgb urine dipstick NEGATIVE NEGATIVE   Bilirubin Urine NEGATIVE NEGATIVE   Ketones, ur NEGATIVE NEGATIVE mg/dL   Protein, ur NEGATIVE NEGATIVE mg/dL   Nitrite NEGATIVE NEGATIVE   Leukocytes, UA NEGATIVE NEGATIVE   RBC / HPF NONE SEEN 0 - 5 RBC/hpf   WBC, UA 0-5 0 - 5 WBC/hpf   Bacteria, UA NONE SEEN NONE SEEN   Squamous Epithelial / LPF 0-5 (A) NONE SEEN   Ct Renal Stone Study  Result Date: 12/09/2016 CLINICAL DATA:  Upper abdomen pain since yesterday EXAM: CT ABDOMEN AND  PELVIS WITHOUT CONTRAST TECHNIQUE: Multidetector CT imaging of the abdomen and pelvis was performed following the standard protocol without IV contrast. COMPARISON:  November 08, 2015 FINDINGS: Lower chest: No acute abnormality. Hepatobiliary: No focal liver abnormality is seen. Status post cholecystectomy. No biliary dilatation. Pancreas: Unremarkable. No pancreatic ductal dilatation or surrounding inflammatory changes. Spleen: Normal in size without focal abnormality. Adrenals/Urinary Tract: 1.5 cm cyst is identified in the upper pole left kidney unchanged. There is no nephrolithiasis or hydroureteronephrosis bilaterally. The adrenal glands are normal. No focal abnormalities identified in the bladder. Stomach/Bowel: Soft optimal assessment of stomach and bowel loops due to lack of tissue planes in lack of IV and oral contrast. There is no bowel obstruction or inflammation. The appendix is normal. Moderate bowel content is identified throughout colon. Vascular/Lymphatic: Aortic atherosclerosis. No enlarged abdominal or pelvic lymph nodes.  Reproductive: Probable uterine fibroid unchanged compared to prior exam. Ovaries are unremarkable. Other: No abdominal wall hernia or abnormality. No abdominopelvic ascites. Musculoskeletal: No acute abnormality. IMPRESSION: No acute abnormality identified in the abdomen and pelvis. There is no bowel obstruction or inflammation. Moderate bowel content is identified throughout colon. No nephrolithiasis or hydroureteronephrosis bilaterally. Electronically Signed   By: Abelardo Diesel M.D.   On: 12/09/2016 14:45

## 2016-12-09 NOTE — ED Notes (Signed)
Pt requesting something to eat and drink, given sprite and [place] sandwich. Pt requesting to speak to MD again, MD informed.

## 2016-12-09 NOTE — ED Notes (Signed)
This Probation officer and [place], EDT both attempted to obtain labs without success. Lab contacted to draw blood.

## 2016-12-09 NOTE — ED Notes (Signed)
Pt sitting at end of the bed attempting to get out. Pt placed back in the bed with bed alarm attached to pt gown.

## 2016-12-09 NOTE — ED Triage Notes (Signed)
Pt comes into the ED via EMS from home with c/o upper abd/chest pain since yesterday. Worse with deep breathing. Denies N/V/D.Helen Reid

## 2017-01-07 ENCOUNTER — Telehealth: Payer: Self-pay | Admitting: Cardiology

## 2017-01-07 NOTE — Telephone Encounter (Signed)
3 attempts to schedule recall 09/30/2015 Deleting recall

## 2017-06-15 IMAGING — US US EXTREM LOW VENOUS*R*
1 series · 13 of 24 positions shown · non-contrast
Comparison: None.

CLINICAL DATA: Right lower leg pain and swelling. Symptoms for
several months.



[Series 1: us extrem low venous*right* · 0.05mm/px · 13 of 34 slices shown]
[im 1/34]
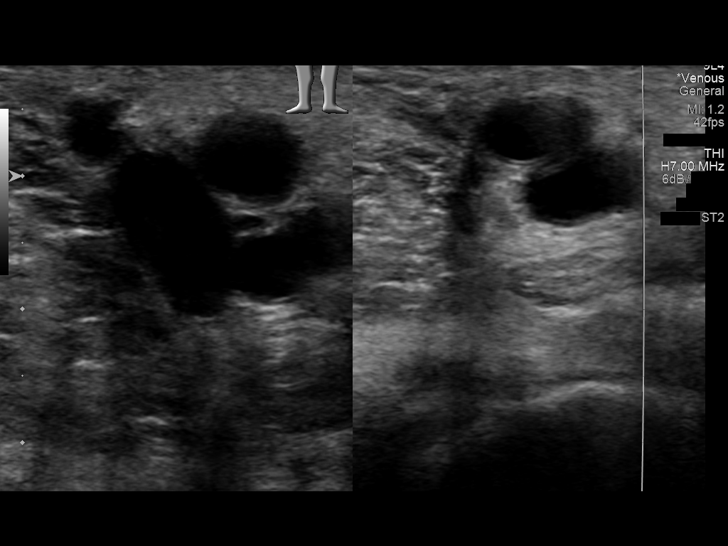
[im 3/34]
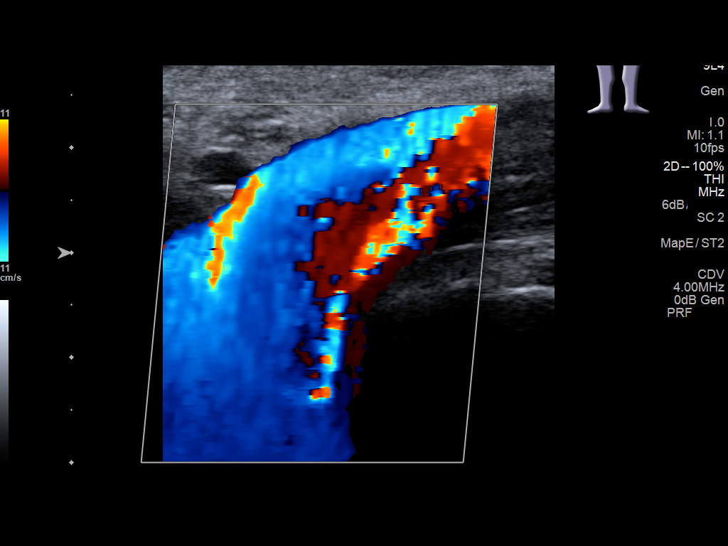
[im 6/34]
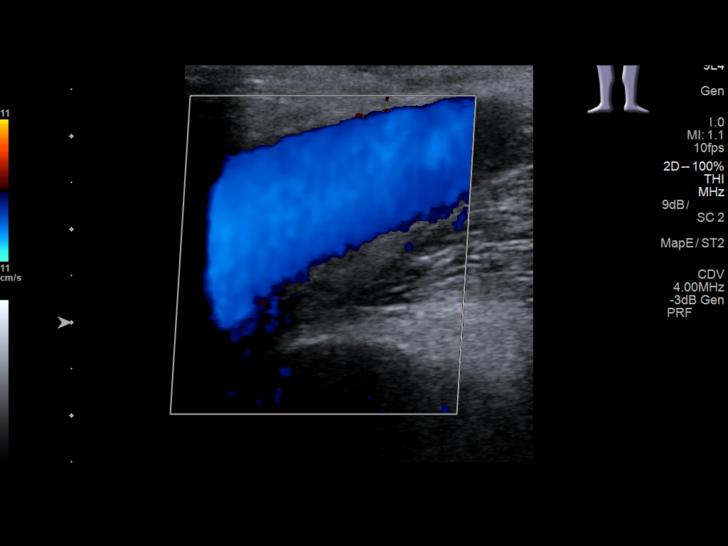
[im 9/34]
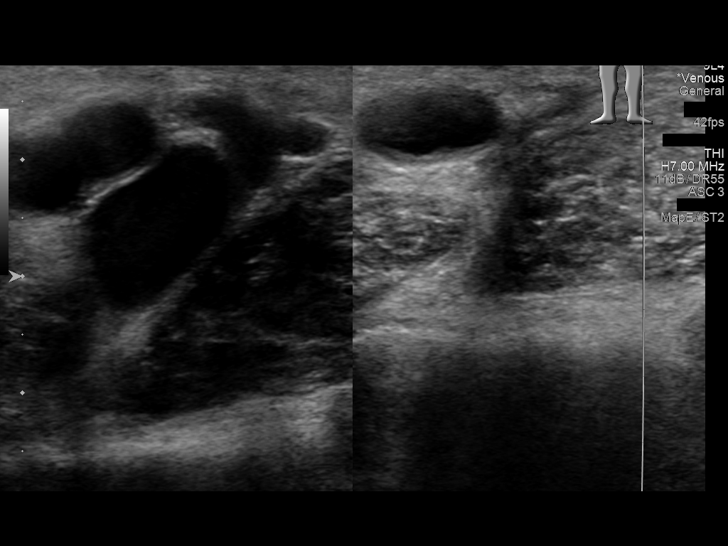
[im 12/34]
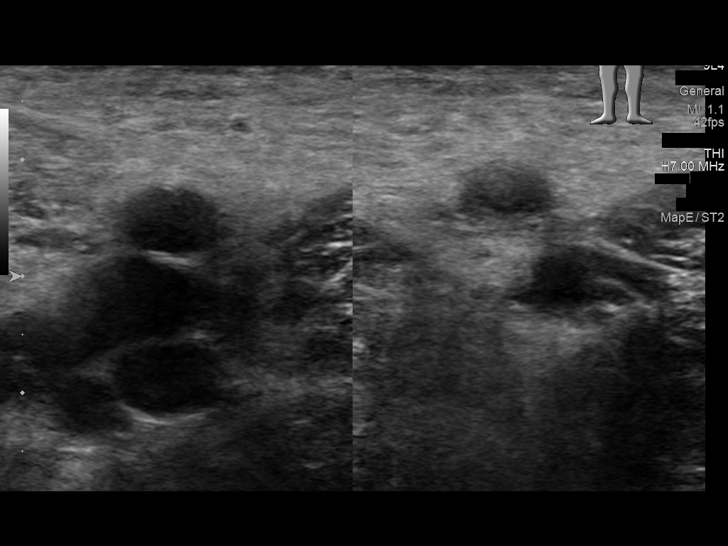
[im 15/34]
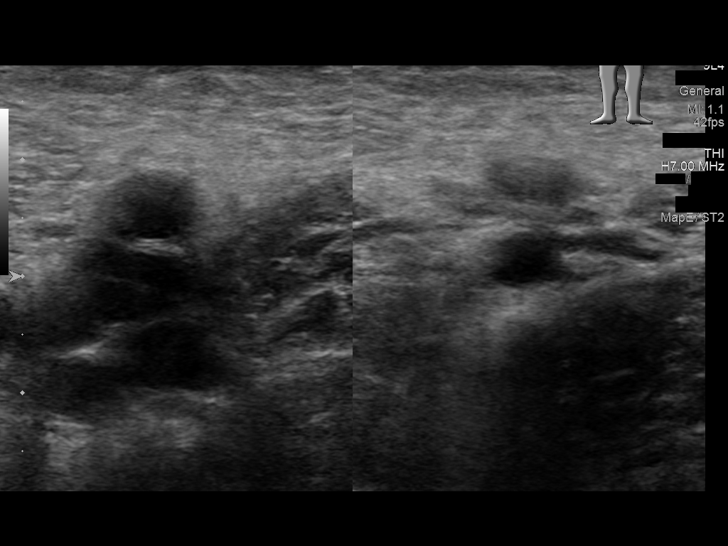
[im 18/34]
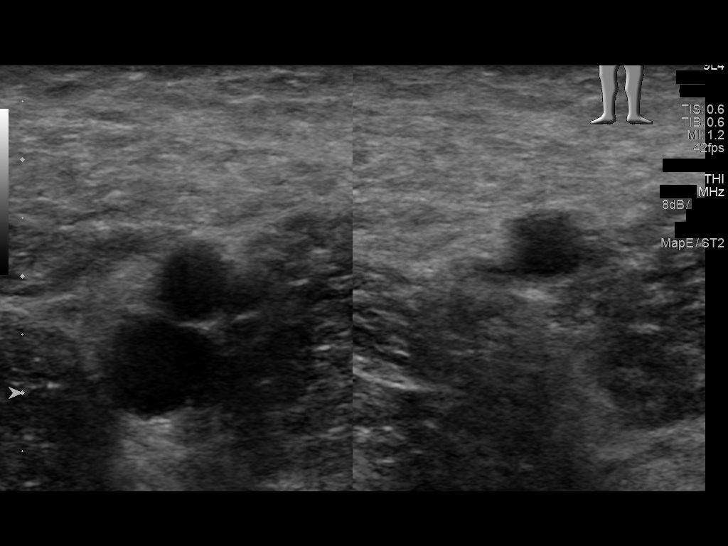
[im 19/34]
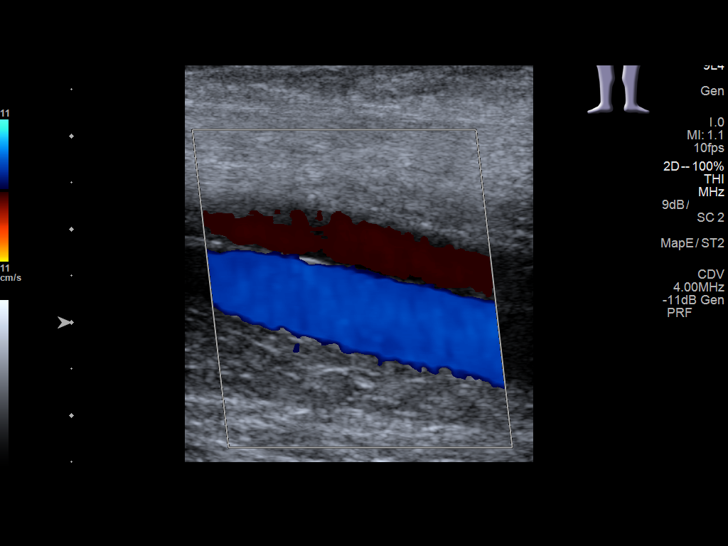
[im 22/34]
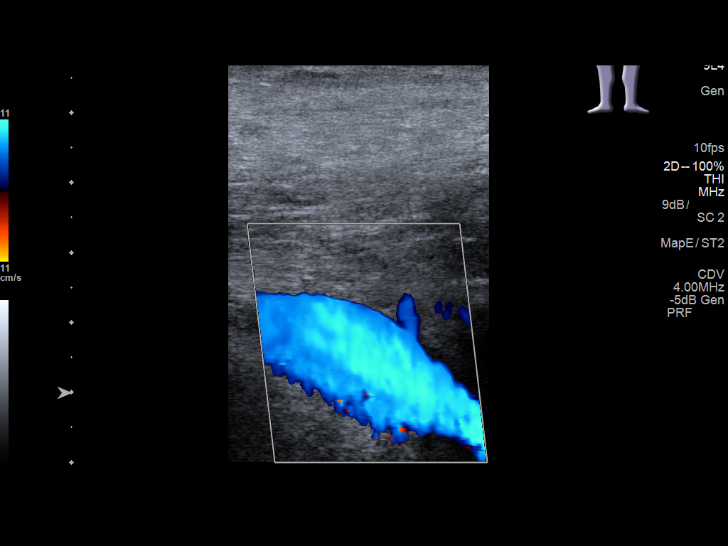
[im 25/34]
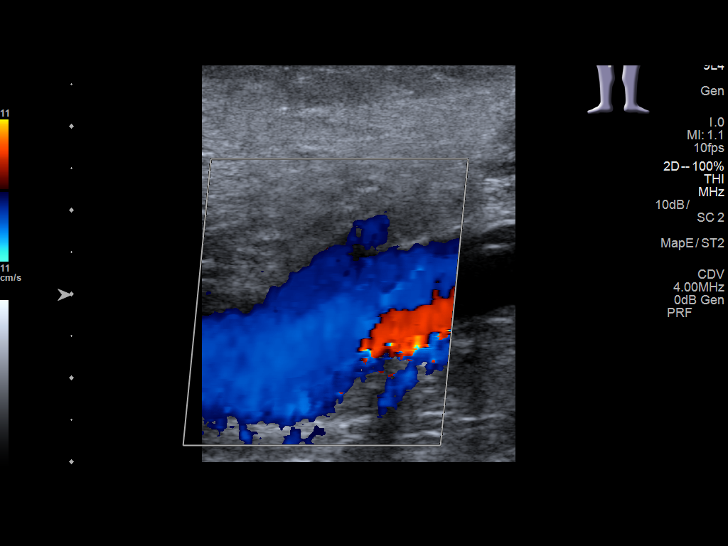
[im 28/34]
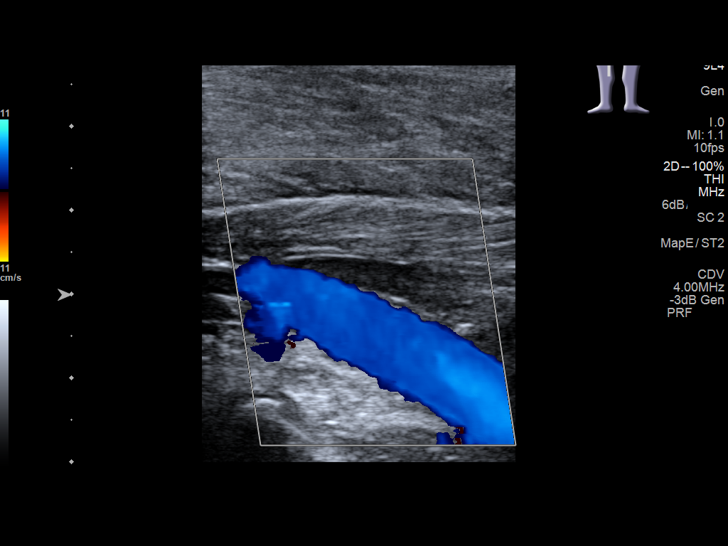
[im 31/34]
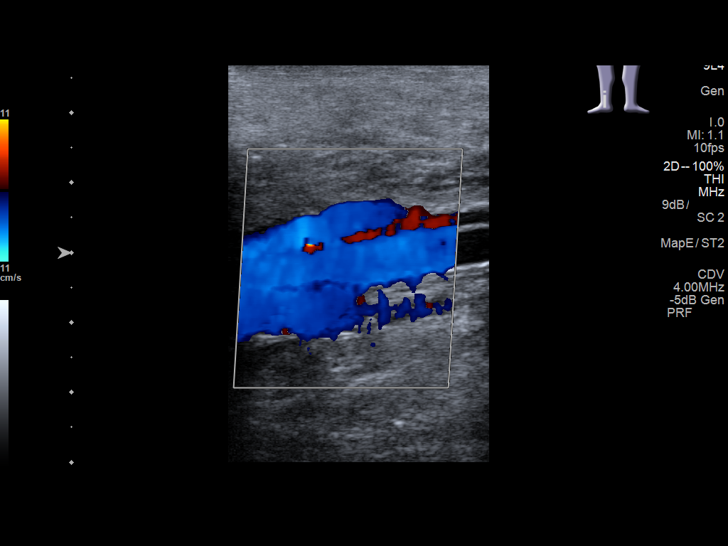
[im 34/34]
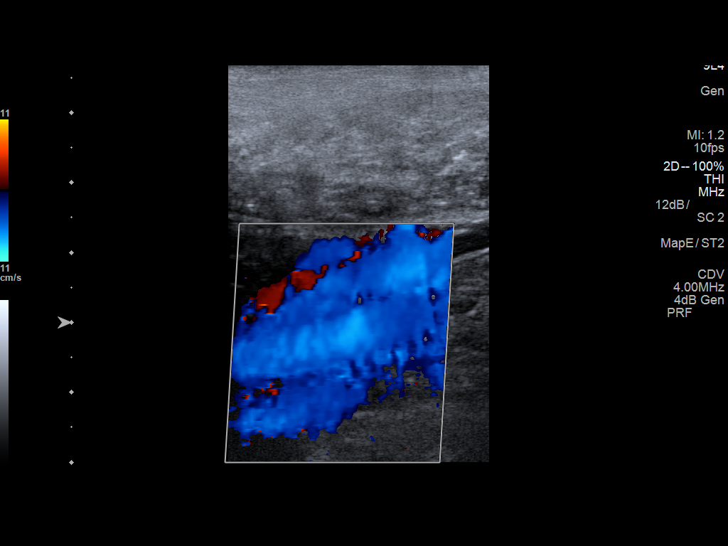

[13 of 24 positions shown; findings below may reference images not displayed]

FINDINGS: Contralateral Common Femoral Vein: Respiratory phasicity is normal
and symmetric with the symptomatic side. No evidence of thrombus.
Normal compressibility.

Common Femoral Vein: No evidence of thrombus. Normal
compressibility, respiratory phasicity and response to augmentation.

Saphenofemoral Junction: No evidence of thrombus. Normal
compressibility and flow on color Doppler imaging.

Profunda Femoral Vein: No evidence of thrombus. Normal
compressibility and flow on color Doppler imaging.

Femoral Vein: No evidence of thrombus. Normal compressibility,
respiratory phasicity and response to augmentation.

Popliteal Vein: No evidence of thrombus. Normal compressibility,
respiratory phasicity and response to augmentation.

Calf Veins: No evidence of thrombus. Normal compressibility and flow
on color Doppler imaging.

Superficial Great Saphenous Vein: No evidence of thrombus. Normal
compressibility and flow on color Doppler imaging.

Venous Reflux:  None.

Other Findings:  None.
IMPRESSION: No evidence of right lower extremity deep venous thrombosis.

## 2017-12-13 IMAGING — CR DG CHEST 1V
1 series · 1 of 1 positions shown · non-contrast
Comparison: 02/06/2015 and CT scan 08/30/2013

CLINICAL DATA: Possible TB, history of TB

EXAM:
CHEST 1 VIEW

[ap]
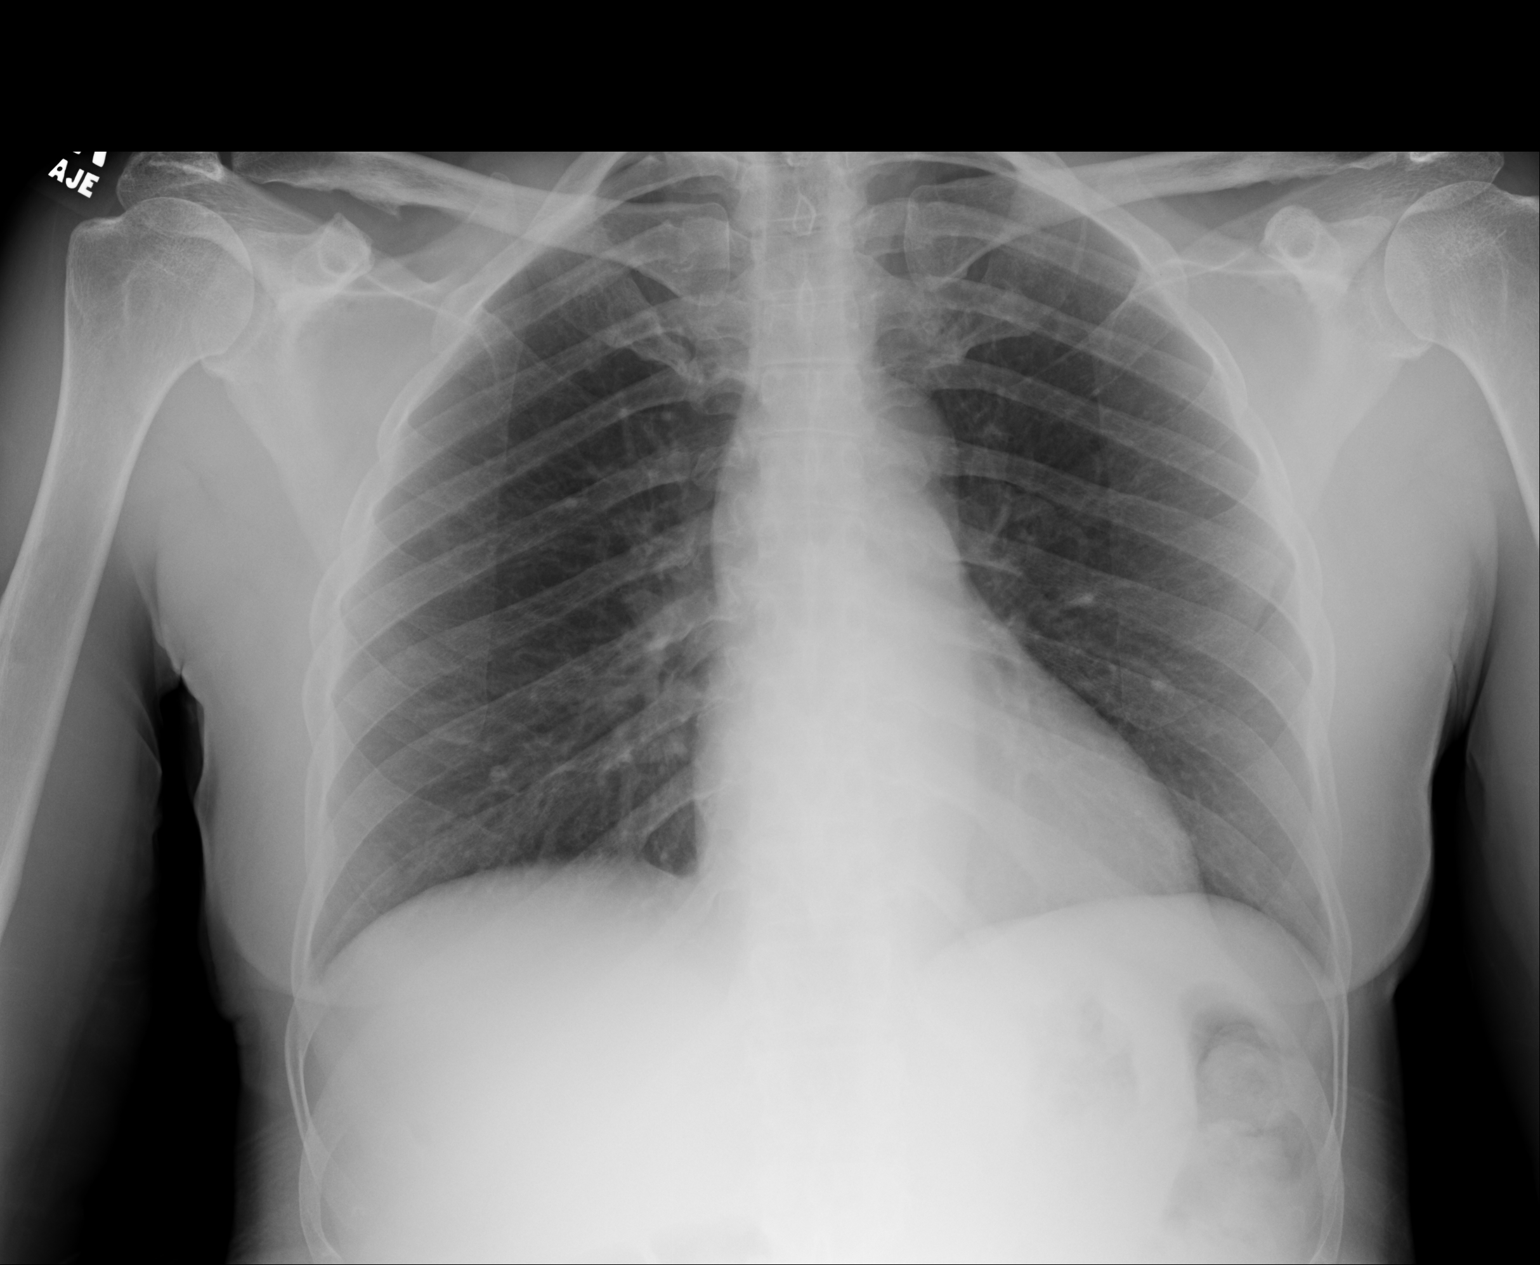

[1 of 1 positions shown; findings below may reference images not displayed]

FINDINGS: Cardiomediastinal silhouette is stable. No acute infiltrate or
pleural effusion. No pulmonary edema. No adenopathy is suggested.
IMPRESSION: No active disease.

## 2020-12-16 ENCOUNTER — Emergency Department
Admission: EM | Admit: 2020-12-16 | Discharge: 2020-12-16 | Disposition: A | Discharge status: Home / Self Care | Payer: Medicaid - Out of State | Attending: Emergency Medicine | Admitting: Emergency Medicine

## 2020-12-16 ENCOUNTER — Emergency Department: Payer: Medicaid - Out of State

## 2020-12-16 ENCOUNTER — Other Ambulatory Visit: Payer: Self-pay

## 2020-12-16 DIAGNOSIS — I129 Hypertensive chronic kidney disease with stage 1 through stage 4 chronic kidney disease, or unspecified chronic kidney disease: Secondary | ICD-10-CM | POA: Insufficient documentation

## 2020-12-16 DIAGNOSIS — Z79899 Other long term (current) drug therapy: Secondary | ICD-10-CM | POA: Insufficient documentation

## 2020-12-16 DIAGNOSIS — Z853 Personal history of malignant neoplasm of breast: Secondary | ICD-10-CM | POA: Diagnosis not present

## 2020-12-16 DIAGNOSIS — M79642 Pain in left hand: Secondary | ICD-10-CM | POA: Diagnosis not present

## 2020-12-16 DIAGNOSIS — Z760 Encounter for issue of repeat prescription: Secondary | ICD-10-CM | POA: Insufficient documentation

## 2020-12-16 DIAGNOSIS — E039 Hypothyroidism, unspecified: Secondary | ICD-10-CM | POA: Insufficient documentation

## 2020-12-16 DIAGNOSIS — K529 Noninfective gastroenteritis and colitis, unspecified: Secondary | ICD-10-CM | POA: Insufficient documentation

## 2020-12-16 DIAGNOSIS — M79641 Pain in right hand: Secondary | ICD-10-CM | POA: Diagnosis not present

## 2020-12-16 DIAGNOSIS — S8992XD Unspecified injury of left lower leg, subsequent encounter: Secondary | ICD-10-CM | POA: Diagnosis present

## 2020-12-16 DIAGNOSIS — Z91010 Allergy to peanuts: Secondary | ICD-10-CM | POA: Diagnosis not present

## 2020-12-16 DIAGNOSIS — M79661 Pain in right lower leg: Secondary | ICD-10-CM | POA: Diagnosis not present

## 2020-12-16 DIAGNOSIS — Z8572 Personal history of non-Hodgkin lymphomas: Secondary | ICD-10-CM | POA: Diagnosis not present

## 2020-12-16 DIAGNOSIS — F17210 Nicotine dependence, cigarettes, uncomplicated: Secondary | ICD-10-CM | POA: Diagnosis not present

## 2020-12-16 DIAGNOSIS — J40 Bronchitis, not specified as acute or chronic: Secondary | ICD-10-CM | POA: Diagnosis not present

## 2020-12-16 DIAGNOSIS — Z20822 Contact with and (suspected) exposure to covid-19: Secondary | ICD-10-CM | POA: Insufficient documentation

## 2020-12-16 DIAGNOSIS — S8292XD Unspecified fracture of left lower leg, subsequent encounter for closed fracture with routine healing: Secondary | ICD-10-CM | POA: Insufficient documentation

## 2020-12-16 DIAGNOSIS — S82891D Other fracture of right lower leg, subsequent encounter for closed fracture with routine healing: Secondary | ICD-10-CM

## 2020-12-16 DIAGNOSIS — N182 Chronic kidney disease, stage 2 (mild): Secondary | ICD-10-CM | POA: Diagnosis not present

## 2020-12-16 DIAGNOSIS — Z720 Tobacco use: Secondary | ICD-10-CM

## 2020-12-16 DIAGNOSIS — Y92410 Unspecified street and highway as the place of occurrence of the external cause: Secondary | ICD-10-CM | POA: Diagnosis not present

## 2020-12-16 LAB — COMPREHENSIVE METABOLIC PANEL
ALT: 12 U/L (ref 0–44)
AST: 21 U/L (ref 15–41)
Albumin: 4.2 g/dL (ref 3.5–5.0)
Alkaline Phosphatase: 58 U/L (ref 38–126)
Anion gap: 7 (ref 5–15)
BUN: 15 mg/dL (ref 8–23)
CO2: 26 mmol/L (ref 22–32)
Calcium: 9.3 mg/dL (ref 8.9–10.3)
Chloride: 104 mmol/L (ref 98–111)
Creatinine, Ser: 1.01 mg/dL — ABNORMAL HIGH (ref 0.44–1.00)
GFR, Estimated: >60 mL/min (ref >60)
Glucose, Bld: 102 mg/dL — ABNORMAL HIGH (ref 70–99)
Potassium: 4.2 mmol/L (ref 3.5–5.1)
Sodium: 137 mmol/L (ref 135–145)
Total Bilirubin: 0.8 mg/dL (ref 0.3–1.2)
Total Protein: 7.5 g/dL (ref 6.5–8.1)

## 2020-12-16 LAB — URINALYSIS, COMPLETE (UACMP) WITH MICROSCOPIC
Bacteria, UA: NONE SEEN
Bilirubin Urine: NEGATIVE
Glucose, UA: NEGATIVE mg/dL
Hgb urine dipstick: NEGATIVE
Ketones, ur: NEGATIVE mg/dL
Leukocytes,Ua: NEGATIVE
Nitrite: NEGATIVE
Protein, ur: NEGATIVE mg/dL
Specific Gravity, Urine: 1.018 (ref 1.005–1.030)
pH: 5 (ref 5.0–8.0)

## 2020-12-16 LAB — RESP PANEL BY RT-PCR (FLU A&B, COVID) ARPGX2
Influenza A by PCR: NEGATIVE
Influenza B by PCR: NEGATIVE
SARS Coronavirus 2 by RT PCR: NEGATIVE

## 2020-12-16 LAB — LIPASE, BLOOD: Lipase: 43 U/L (ref 11–51)

## 2020-12-16 LAB — CBC
HCT: 29.9 % — ABNORMAL LOW (ref 36.0–46.0)
Hemoglobin: 9.8 g/dL — ABNORMAL LOW (ref 12.0–15.0)
MCH: 25.7 pg — ABNORMAL LOW (ref 26.0–34.0)
MCHC: 32.8 g/dL (ref 30.0–36.0)
MCV: 78.5 fL — ABNORMAL LOW (ref 80.0–100.0)
Platelets: 277 10*3/uL (ref 150–400)
RBC: 3.81 MIL/uL — ABNORMAL LOW (ref 3.87–5.11)
RDW: 15.9 % — ABNORMAL HIGH (ref 11.5–15.5)
WBC: 3.7 10*3/uL — ABNORMAL LOW (ref 4.0–10.5)
nRBC: 0 % (ref 0.0–0.2)

## 2020-12-16 LAB — TROPONIN I (HIGH SENSITIVITY): Troponin I (High Sensitivity): 4 ng/L (ref <18)

## 2020-12-16 LAB — MAGNESIUM: Magnesium: 2 mg/dL (ref 1.7–2.4)

## 2020-12-16 MED ORDER — ALBUTEROL SULFATE HFA 108 (90 BASE) MCG/ACT IN AERS: 2.0000 | INHALATION_SPRAY | Freq: Four times a day (QID) | RESPIRATORY_TRACT | 0 refills | Status: DC | PRN

## 2020-12-16 MED ORDER — ONDANSETRON HCL 4 MG PO TABS: 4.0000 mg | ORAL_TABLET | Freq: Three times a day (TID) | ORAL | 0 refills | Status: DC | PRN

## 2020-12-16 MED ORDER — LEVOTHYROXINE SODIUM 75 MCG PO TABS: 75.0000 ug | ORAL_TABLET | Freq: Every day | ORAL | 0 refills | Status: DC

## 2020-12-16 MED ORDER — BENZONATATE 100 MG PO CAPS: 100.0000 mg | ORAL_CAPSULE | Freq: Three times a day (TID) | ORAL | 0 refills | Status: DC | PRN

## 2020-12-16 MED ORDER — ONDANSETRON 4 MG PO TBDP
4.0000 mg | ORAL_TABLET | Freq: Once | ORAL | Status: AC
  Administered 2020-12-16: 4 mg via ORAL
  Filled 2020-12-16: qty 1

## 2020-12-16 MED ORDER — NAPROXEN 500 MG PO TABS
500.0000 mg | ORAL_TABLET | Freq: Once | ORAL | Status: AC
  Administered 2020-12-16: 500 mg via ORAL
  Filled 2020-12-16: qty 1

## 2020-12-16 MED ORDER — DICYCLOMINE HCL 20 MG PO TABS: 20.0000 mg | ORAL_TABLET | Freq: Three times a day (TID) | ORAL | 0 refills | Status: DC | PRN
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
## 2021-01-09 ENCOUNTER — Other Ambulatory Visit: Payer: Self-pay | Admitting: Nurse Practitioner

## 2021-01-09 NOTE — Telephone Encounter (Signed)
Called patient on [phone] as listed in chart and no answer, reported # changed, disconnected or no longer in service. Called patient 's daughter and verified patient's # and # in chart listed incorrectly . Called correct [phone], no answer left message to call clinic back to review medication requests and review if patient having any symptoms of seizures.

## 2021-01-09 NOTE — Telephone Encounter (Signed)
Requested medication (s) are due for refill today: Yes  Requested medication (s) are on the active medication list: Yes for Levothyroxine; No for Gabapentin  Last refill:  3 weeks ago Levothyroxine; Gabapentin unknown last refill  Future visit scheduled: Yes  Notes to clinic:  Unable to refill per protocol, last refill by another provider. See notes from patient     Requested Prescriptions  Pending Prescriptions Disp Refills   levothyroxine (SYNTHROID) 75 MCG tablet 30 tablet 0    Sig: Take 1 tablet (75 mcg total) by mouth daily.     Endocrinology:  Hypothyroid Agents Failed - 01/09/2021 10:41 PM      Failed - TSH needs to be rechecked within 3 months after an abnormal result. Refill until TSH is due.      Failed - TSH in normal range and within 360 days    TSH  Date Value Ref Range Status  08/21/2015 75.430 (H) 0.450 - 4.500 uIU/mL Final          Failed - Valid encounter within last 12 months    Recent Outpatient Visits           5 years ago Abnormal vaginal bleeding   [place], Satira Anis, MD   5 years ago Right knee pain   [facility] [place], Satira Anis, MD   5 years ago Bradycardia   [facility] [place], Drue Stager, MD   5 years ago Pain and swelling of right lower leg   [facility] [place], Satira Anis, MD   5 years ago Hypothyroidism due to acquired atrophy of thyroid   [place], Satira Anis, MD       Future Appointments             In 1 month Cannady, Barbaraann Faster, NP [place]             gabapentin (NEURONTIN) 400 MG capsule      Sig: Take 1 capsule (400 mg total) by mouth 3 (three) times daily.     Neurology: Anticonvulsants - gabapentin Failed - 01/09/2021 10:41 PM      Failed - Valid encounter within last 12 months    Recent Outpatient Visits           5 years ago Abnormal vaginal bleeding   [place], Satira Anis, MD   5 years ago Right knee pain   [facility] [place], Satira Anis, MD   5 years ago Bradycardia   [facility] [place], Drue Stager, MD   5 years ago Pain and swelling of right lower leg   [place], Satira Anis, MD   5 years ago Hypothyroidism due to acquired atrophy of thyroid   [place], Satira Anis, MD       Future Appointments             In 1 month Cannady, Barbaraann Faster, NP [facility], [facility]

## 2021-01-09 NOTE — Telephone Encounter (Signed)
Medication Refill - Medication:  levothyroxine (SYNTHROID) 75 MCG tablet  gabapentin (NEURONTIN) tablet 400 mg   *Pt no longer seeing providers who prescribed the medication and needs medication asap, pt states her seizers are coming back, pt also has appt 10/12. Please advise.*  Has the patient contacted their pharmacy? No.  Preferred Pharmacy (with phone number or street name):  [id number]:  [phone] Fax:  [phone]     Agent: Please be advised that RX refills may take up to 3 business days. We ask that you follow-up with your pharmacy.

## 2021-02-25 ENCOUNTER — Ambulatory Visit: Payer: Self-pay | Admitting: Nurse Practitioner

## 2021-08-02 NOTE — Progress Notes (Deleted)
? ?Acute Office Visit ? ?Subjective:  ? ? Patient ID: Helen Reid, female    DOB: 07/10/1960, 61 y.o.   MRN: [id number] ? ?No chief complaint on file. ? ? ?Chest Pain  ?This is a new problem. The current episode started in the past 7 days. The pain is mild. The pain radiates to the left shoulder and left arm.  ? ?Past Medical History:  ?Diagnosis Date  ? Anxiety   ? Cancer [facility])   ? lymphoma, gallbladder, breast  ? Depression   ? Hypertension   ? PTSD (post-traumatic stress disorder)   ? Vitamin D deficiency 09/27/2015  ? ? ?Past Surgical History:  ?Procedure Laterality Date  ? BREAST SURGERY    ? CHOLECYSTECTOMY    ? ? ?Family History  ?Problem Relation Age of Onset  ? Heart disease Mother   ? Hypertension Mother   ? Asthma Mother   ? Heart disease Father   ? ? ?Social History  ? ?Socioeconomic History  ? Marital status: Single  ?  Spouse name: Not on file  ? Number of children: Not on file  ? Years of education: Not on file  ? Highest education level: Not on file  ?Occupational History  ? Not on file  ?Tobacco Use  ? Smoking status: Some Days  ?  Packs/day: 0.00  ?  Types: Cigarettes  ? Smokeless tobacco: Never  ?Substance and Sexual Activity  ? Alcohol use: No  ?  Alcohol/week: 0.0 standard drinks  ? Drug use: Yes  ?  Types: Marijuana  ? Sexual activity: Yes  ?  Partners: Male  ?Other Topics Concern  ? Not on file  ?Social History Narrative  ? Not on file  ? ?Social Determinants of Health  ? ?Financial Resource Strain: Not on file  ?Food Insecurity: Not on file  ?Transportation Needs: Not on file  ?Physical Activity: Not on file  ?Stress: Not on file  ?Social Connections: Not on file  ?Intimate Partner Violence: Not on file  ? ? ?Outpatient Medications Prior to Visit  ?Medication Sig Dispense Refill  ? albuterol (VENTOLIN HFA) 108 (90 Base) MCG/ACT inhaler Inhale 2 puffs into the lungs every 6 (six) hours as needed for wheezing or shortness of breath. 1 each 0  ? benzonatate (TESSALON PERLES) 100 MG capsule Take  1 capsule (100 mg total) by mouth 3 (three) times daily as needed for cough. 15 capsule 0  ? citalopram (CELEXA) 10 MG tablet Take 10 mg by mouth daily.    ? diclofenac sodium (VOLTAREN) 1 % GEL Use 2 grams on hand, 4 grams on knee; up to four times a day PRN pain 100 g 2  ? dicyclomine (BENTYL) 20 MG tablet Take 1 tablet (20 mg total) by mouth 3 (three) times daily as needed for spasms. 30 tablet 0  ? famotidine (PEPCID) 20 MG tablet Take 1 tablet (20 mg total) by mouth 2 (two) times daily. 60 tablet 0  ? levothyroxine (SYNTHROID) 75 MCG tablet Take 1 tablet (75 mcg total) by mouth daily. 30 tablet 0  ? omeprazole (PRILOSEC) 40 MG capsule Take 1 capsule (40 mg total) by mouth daily. 30 capsule 0  ? ondansetron (ZOFRAN) 4 MG tablet Take 1 tablet (4 mg total) by mouth every 8 (eight) hours as needed for up to 10 doses for nausea or vomiting. 10 tablet 0  ? prazosin (MINIPRESS) 5 MG capsule Take 15 mg by mouth daily.    ? Vitamin D, Ergocalciferol, (DRISDOL) 50000  units CAPS capsule Take 1 capsule (50,000 Units total) by mouth every 30 (thirty) days. 1 capsule 2  ? ?No facility-administered medications prior to visit.  ? ? ?Allergies  ?Allergen Reactions  ? Peanut-Containing Drug Products Shortness Of Breath  ? Penicillins Anaphylaxis  ?  Has patient had a PCN reaction causing immediate rash, facial/tongue/throat swelling, SOB or lightheadedness with hypotension: Yes ?Has patient had a PCN reaction causing severe rash involving mucus membranes or skin necrosis: No ?Has patient had a PCN reaction that required hospitalization Yes ?Has patient had a PCN reaction occurring within the last 10 years: No ?If all of the above answers are "NO", then may proceed with Cephalosporin use.  ? Aspirin Swelling  ? Dilantin  [Phenytoin] Nausea And Vomiting  ? Macrolides And Ketolides Swelling  ? Phenobarbital Nausea And Vomiting  ? ? ?Review of Systems  ?Cardiovascular:  Positive for chest pain.  ? ?   ?Objective:  ?  ?Physical  Exam ? ?There were no vitals taken for this visit. ?Wt Readings from Last 3 Encounters:  ?12/16/20 140 lb (63.5 kg)  ?09/13/16 154 lb 2 oz (69.9 kg)  ?08/08/16 150 lb (68 kg)  ? ? ?Health Maintenance Due  ?Topic Date Due  ? COVID-19 Vaccine (1) Never done  ? HIV Screening  Never done  ? Hepatitis C Screening  Never done  ? PAP SMEAR-Modifier  Never done  ? MAMMOGRAM  Never done  ? Zoster Vaccines- Shingrix (1 of 2) Never done  ? Fecal DNA (Cologuard)  08/27/2018  ? INFLUENZA VACCINE  Never done  ? ? ?There are no preventive care reminders to display for this patient. ? ? ?Lab Results  ?Component Value Date  ? TSH 75.430 (H) 08/21/2015  ? ?Lab Results  ?Component Value Date  ? WBC 3.7 (L) 12/16/2020  ? HGB 9.8 (L) 12/16/2020  ? HCT 29.9 (L) 12/16/2020  ? MCV 78.5 (L) 12/16/2020  ? PLT 277 12/16/2020  ? ?Lab Results  ?Component Value Date  ? NA 137 12/16/2020  ? K 4.2 12/16/2020  ? CO2 26 12/16/2020  ? GLUCOSE 102 (H) 12/16/2020  ? BUN 15 12/16/2020  ? CREATININE 1.01 (H) 12/16/2020  ? BILITOT 0.8 12/16/2020  ? ALKPHOS 58 12/16/2020  ? AST 21 12/16/2020  ? ALT 12 12/16/2020  ? PROT 7.5 12/16/2020  ? ALBUMIN 4.2 12/16/2020  ? CALCIUM 9.3 12/16/2020  ? ANIONGAP 7 12/16/2020  ? ?Lab Results  ?Component Value Date  ? CHOL 233 (H) 05/27/2015  ? ?Lab Results  ?Component Value Date  ? HDL 63 05/27/2015  ? ?Lab Results  ?Component Value Date  ? LDLCALC 154 (H) 05/27/2015  ? ?Lab Results  ?Component Value Date  ? TRIG 80 05/27/2015  ? ?Lab Results  ?Component Value Date  ? CHOLHDL 3.7 05/27/2015  ? ?Lab Results  ?Component Value Date  ? HGBA1C 5.4 05/27/2015  ? ? ?   ?Assessment & Plan:  ? ?Problem List Items Addressed This Visit   ?None ? ? ? ?No orders of the defined types were placed in this encounter. ? ? ? ?Lenetta Quaker, CMA ?[facility]  ?

## 2021-08-04 ENCOUNTER — Ambulatory Visit: Payer: Self-pay | Admitting: *Deleted

## 2021-08-04 NOTE — Telephone Encounter (Signed)
Reason for Disposition ? Pain also in shoulder(s) or arm(s) or jaw (Exception: pain is clearly made worse by movement) ? ?Answer Assessment - Initial Assessment Questions ?1. LOCATION: "Where does it hurt?"   ?    Middle to left side ?2. RADIATION: "Does the pain go anywhere else?" (e.g., into neck, jaw, arms, back) ?    To neck, arm, shoulder ?3. ONSET: "When did the chest pain begin?" (Minutes, hours or days)  ?    3-4 days ago worsening ?4. PATTERN "Does the pain come and go, or has it been constant since it started?"  "Does it get worse with exertion?"  ?    Constant ?5. DURATION: "How long does it last" (e.g., seconds, minutes, hours) ?    constant ?6. SEVERITY: "How bad is the pain?"  (e.g., Scale 1-10; mild, moderate, or severe) ?   - MILD (1-3): doesn't interfere with normal activities  ?   - MODERATE (4-7): interferes with normal activities or awakens from sleep ?   - SEVERE (8-10): excruciating pain, unable to do any normal activities   ?    8/10 ?7. CARDIAC RISK FACTORS: "Do you have any history of heart problems or risk factors for heart disease?" (e.g., angina, prior heart attack; diabetes, high blood pressure, high cholesterol, smoker, or strong family history of heart disease) ?     ?8. PULMONARY RISK FACTORS: "Do you have any history of lung disease?"  (e.g., blood clots in lung, asthma, emphysema, birth control pills) ?     ?9. CAUSE: "What do you think is causing the chest pain?" ?    Unsure ?10. OTHER SYMPTOMS: "Do you have any other symptoms?" (e.g., dizziness, nausea, vomiting, sweating, fever, difficulty breathing, cough) ?      Cough, sinus' full, thick mucous, greenish, blood in it at times, SOB at times ? ?Protocols used: Chest Pain-A-AH ? ?

## 2021-08-04 NOTE — Telephone Encounter (Signed)
?  Chief Complaint: Chest pain ?Symptoms: Mid chest pain, radiates to left, left arm and shoulder. Cough, greenish mucous "A lot" SOB at times   ?Frequency: "Weeks" Worse 3-4 days ?Pertinent Negatives: Patient denies  ?Disposition: '[x]'$ ED /'[]'$ Urgent Care (no appt availability in office) / '[]'$ Appointment(In office/virtual)/ '[]'$  [facility]/ '[]'$ Home Care/ '[]'$ Refused Recommended Disposition /'[]'$ [place] [facility]/ '[]'$  Follow-up with PCP ?Additional Notes: Unsure pt will follow disposition. Transportation an issue, advised EMS. Pt states will attempt to get ride. Evasive historian. No PCP ? ?

## 2021-08-06 ENCOUNTER — Ambulatory Visit: Payer: Medicaid Other | Admitting: Physician Assistant

## 2021-08-18 ENCOUNTER — Other Ambulatory Visit: Payer: Self-pay

## 2021-08-18 ENCOUNTER — Emergency Department
Admission: EM | Admit: 2021-08-18 | Discharge: 2021-08-18 | Disposition: A | Discharge status: Home / Self Care | Payer: Medicaid Other | Attending: Emergency Medicine | Admitting: Emergency Medicine

## 2021-08-18 ENCOUNTER — Encounter: Payer: Self-pay | Admitting: Emergency Medicine

## 2021-08-18 DIAGNOSIS — S80261A Insect bite (nonvenomous), right knee, initial encounter: Secondary | ICD-10-CM | POA: Diagnosis present

## 2021-08-18 DIAGNOSIS — W57XXXA Bitten or stung by nonvenomous insect and other nonvenomous arthropods, initial encounter: Secondary | ICD-10-CM | POA: Diagnosis not present

## 2021-08-18 DIAGNOSIS — T782XXA Anaphylactic shock, unspecified, initial encounter: Secondary | ICD-10-CM | POA: Diagnosis not present

## 2021-08-18 MED ORDER — EPINEPHRINE 0.3 MG/0.3ML IJ SOAJ: 0.3000 mg | INTRAMUSCULAR | 2 refills | Status: DC | PRN

## 2021-08-18 MED ORDER — PREDNISONE 20 MG PO TABS: 60.0000 mg | ORAL_TABLET | Freq: Once | ORAL | Status: DC

## 2021-08-18 MED ORDER — METHYLPREDNISOLONE SODIUM SUCC 125 MG IJ SOLR
125.0000 mg | Freq: Once | INTRAMUSCULAR | Status: AC
  Administered 2021-08-18: 125 mg via INTRAVENOUS
  Filled 2021-08-18: qty 2

## 2021-08-18 MED ORDER — FAMOTIDINE IN NACL 20-0.9 MG/50ML-% IV SOLN
20.0000 mg | Freq: Once | INTRAVENOUS | Status: AC
  Administered 2021-08-18: 20 mg via INTRAVENOUS
  Filled 2021-08-18: qty 50

## 2021-08-18 MED ORDER — PREDNISONE 10 MG PO TABS: 20.0000 mg | ORAL_TABLET | Freq: Every day | ORAL | 0 refills | Status: DC

## 2021-08-18 NOTE — Discharge Instructions (Addendum)
I wish you would stay a little longer but since you want I will let you go.  Please take 1 over-the-counter Benadryl 4 times a day for the next 24 hours so that would be 2 more doses today and 2 doses in the morning and afternoon tomorrow.  Take the prednisone prescription that I am going to give you 1 pill tomorrow.  We will give you 1 other pill here today.  Please call 911 if you have any trouble with your breathing again. ?

## 2021-08-18 NOTE — ED Notes (Signed)
Pt given meal tray.

## 2021-08-18 NOTE — ED Provider Notes (Signed)
? ?[facility] ?Provider Note ? ? ? Event Date/Time  ? First MD Initiated Contact with Patient 08/18/21 1314   ?  (approximate) ? ? ?History  ? ?Allergic Reaction (Bee Sting) ? ? ?HPI ? ?Helen Reid is a 61 y.o. female who was stung on the right knee by a bee.  She got short of breath and swelling.  EMS was called they gave her 50 Benadryl IV and a milligram of epinephrine.  Patient is now better.  She is a little hoarse still but this reportedly is much better than it was. ? ?Past medical history significant for seizures and colon cancer which apparently is recurrent. ? ? ?Physical Exam  ? ?Triage Vital Signs: ?ED Triage Vitals  ?Enc Vitals Group  ?   BP   ?   Pulse   ?   Resp   ?   Temp   ?   Temp src   ?   SpO2   ?   Weight   ?   Height   ?   Head Circumference   ?   Peak Flow   ?   Pain Score   ?   Pain Loc   ?   Pain Edu?   ?   Excl. in [place]?   ? ? ?Most recent vital signs: ?Vitals:  ? 08/18/21 1324  ?BP: 108/79  ?Pulse: (!) 55  ?Resp: 16  ?Temp: 98.5 ?F (36.9 ?C)  ?SpO2: 93%  ? ? ? ?General: Awake, no distress.  ?Neck supple no stridor ?CV:  Good peripheral perfusion.  Heart regular rate and rhythm no audible murmur ?Resp:  Normal effort.  Lungs are clear ?Abd:  No distention.  Nontender ?Extremities with no edema ? ? ?ED Results / Procedures / Treatments  ? ?Labs ?(all labs ordered are listed, but only abnormal results are displayed) ?Labs Reviewed - No data to display ? ? ?EKG ? ? ? ? ?RADIOLOGY ? ? ?PROCEDURES: ? ?Critical Care performed:  ? ?Procedures ? ? ?MEDICATIONS ORDERED IN ED: ?Medications  ?predniSONE (DELTASONE) tablet 60 mg (has no administration in time range)  ?methylPREDNISolone sodium succinate (SOLU-MEDROL) 125 mg/2 mL injection 125 mg (125 mg Intravenous Given 08/18/21 1325)  ?famotidine (PEPCID) IVPB 20 mg premix (0 mg Intravenous Stopped 08/18/21 1356)  ? ? ? ?IMPRESSION / MDM / ASSESSMENT AND PLAN / ED COURSE  ?I reviewed the triage vital signs and the nursing  notes. ?Patient does not want to stay any longer.  She is aware of the risk of return of her anaphylaxis symptoms and shortness of breath and death if she leaves now but she will not stay.  I will sign her out against advice.  I will give her 60 mg of prednisone now and 20 more to take tomorrow along with Benadryl for the rest of today and tomorrow morning.  She will come back via EMS if she worsens. ? ? ? ? ? ? ?FINAL CLINICAL IMPRESSION(S) / ED DIAGNOSES  ? ?Final diagnoses:  ?Anaphylaxis, initial encounter  ? ? ? ?Rx / DC Orders  ? ?ED Discharge Orders   ? ?      Ordered  ?  predniSONE (DELTASONE) 10 MG tablet  Daily       ? 08/18/21 1545  ? ?  ?  ? ?  ? ? ? ?Note:  This document was prepared using Dragon voice recognition software and may include unintentional dictation errors. ?  ?Nena Polio, MD ?08/18/21  1546 ? ?

## 2021-08-18 NOTE — ED Triage Notes (Signed)
Pt to ED via [facility] from home for an Allergic reaction to a Bee Sting on the right leg. EMS reports that upon their arrival pt had swelling in her right leg where the sting was at, stridor, and shortness of breath. Shortness of breath has improved, however, pt is still hoarse. Pt is still having some nausea but no vomiting noted at this time. Pt was given 50 mg of Benadryl and 1 epi pen. Pt is in NAD.  ?

## 2021-09-15 ENCOUNTER — Encounter: Payer: Self-pay | Admitting: Physician Assistant

## 2021-09-15 ENCOUNTER — Ambulatory Visit (INDEPENDENT_AMBULATORY_CARE_PROVIDER_SITE_OTHER): Payer: Medicaid Other | Admitting: Physician Assistant

## 2021-09-15 VITALS — BP 122/62 | HR 51 | Temp 98.5°F | Resp 16 | Ht 64.25 in | Wt 161.9 lb

## 2021-09-15 DIAGNOSIS — F325 Major depressive disorder, single episode, in full remission: Secondary | ICD-10-CM | POA: Diagnosis not present

## 2021-09-15 DIAGNOSIS — M2550 Pain in unspecified joint: Secondary | ICD-10-CM

## 2021-09-15 DIAGNOSIS — E039 Hypothyroidism, unspecified: Secondary | ICD-10-CM | POA: Diagnosis not present

## 2021-09-15 DIAGNOSIS — E559 Vitamin D deficiency, unspecified: Secondary | ICD-10-CM

## 2021-09-15 DIAGNOSIS — F419 Anxiety disorder, unspecified: Secondary | ICD-10-CM | POA: Diagnosis not present

## 2021-09-15 DIAGNOSIS — F1220 Cannabis dependence, uncomplicated: Secondary | ICD-10-CM

## 2021-09-15 DIAGNOSIS — K582 Mixed irritable bowel syndrome: Secondary | ICD-10-CM

## 2021-09-15 DIAGNOSIS — J449 Chronic obstructive pulmonary disease, unspecified: Secondary | ICD-10-CM

## 2021-09-15 DIAGNOSIS — F17200 Nicotine dependence, unspecified, uncomplicated: Secondary | ICD-10-CM

## 2021-09-15 DIAGNOSIS — K581 Irritable bowel syndrome with constipation: Secondary | ICD-10-CM

## 2021-09-15 DIAGNOSIS — N182 Chronic kidney disease, stage 2 (mild): Secondary | ICD-10-CM

## 2021-09-15 DIAGNOSIS — J4531 Mild persistent asthma with (acute) exacerbation: Secondary | ICD-10-CM

## 2021-09-15 DIAGNOSIS — A159 Respiratory tuberculosis unspecified: Secondary | ICD-10-CM

## 2021-09-15 DIAGNOSIS — F4310 Post-traumatic stress disorder, unspecified: Secondary | ICD-10-CM

## 2021-09-15 MED ORDER — FLUTICASONE-SALMETEROL 100-50 MCG/ACT IN AEPB: 1.0000 | INHALATION_SPRAY | Freq: Two times a day (BID) | RESPIRATORY_TRACT | 3 refills | Status: DC

## 2021-09-15 NOTE — Progress Notes (Signed)
? ?I,[name],acting as a Education administrator for [employer], PA-C.,have documented all relevant documentation on the behalf of Helen Speak, PA-C,as directed by  [employer], PA-C while in the presence of [employer], PA-C.  ? ? ?New patient visit ? ? ?Patient: Helen Reid   DOB: 01-16-1961   61 y.o. Female  MRN: [id number] ?Visit Date: 09/15/2021 ? ?Today's healthcare provider: [name], PA-C  ? ?Chief Complaint  ?Patient presents with  ? Establish Care  ? ?Subjective  ?  ?Helen Reid is a 61 y.o. female who presents today as a new patient to establish care.  Previous PCP was Helen Reid in [place]. Patient left [facility] due to personal conflict. ? ?HPI  ? ?Patient was last seen on 03/14/17 at [facility] clinic, per chart review ? ?Patient discussed the most important problems today ?Has a long hx of noncompliance with her treatments/referrals/imaging ? ?Hypothyroidism: was on synthroid 125 mcg daily. Currently, does not take synthroid. Last TSH: TSH 16.650 (H) 03/14/2017  ?Pt has no complaints of hair changes, mood changes, or skin changes. Endorses cold intolerance. No other complaints.  ? ?Depression/PTSD/Anxiety/Drug use: Was on prazosin 15 mg qhs, celexa. Managed by [employer]. Spiritual. Currently lives in [place].  ? ?Chronic airflow limitations:  ?Out of inhalers ?Smoker for 55 years (1/2 pack a day) ? ?PMHx/PSHx/FMHx/SHx reviewed and updated in Epic. ?  ?Past Medical History:  ?Diagnosis Date  ? Anxiety   ? Cancer [facility])   ? lymphoma, gallbladder, breast  ? Depression   ? Hypertension   ? IBS (irritable bowel syndrome)   ? PTSD (post-traumatic stress disorder)   ? Vitamin D deficiency 09/27/2015  ? ?Past Surgical History:  ?Procedure Laterality Date  ? BREAST SURGERY    ? CHOLECYSTECTOMY    ? ?Family Status  ?Relation Name Status  ? Mother  Deceased  ? Father  Deceased  ? ?Family History  ?Problem Relation Age of Onset  ? Heart disease Mother   ? Hypertension Mother   ?  Asthma Mother   ? Heart disease Father   ? ?Social History  ? ?Socioeconomic History  ? Marital status: Single  ?  Spouse name: Not on file  ? Number of children: Not on file  ? Years of education: Not on file  ? Highest education level: Not on file  ?Occupational History  ? Not on file  ?Tobacco Use  ? Smoking status: Some Days  ?  Packs/day: 0.00  ?  Types: Cigarettes  ? Smokeless tobacco: Never  ?Substance and Sexual Activity  ? Alcohol use: Yes  ?  Alcohol/week: 1.0 standard drink  ?  Types: 1 Glasses of wine per week  ? Drug use: Yes  ?  Types: Marijuana, "Crack" cocaine  ? Sexual activity: Yes  ?  Partners: Male  ?Other Topics Concern  ? Not on file  ?Social History Narrative  ? Not on file  ? ?Social Determinants of Health  ? ?Financial Resource Strain: Not on file  ?Food Insecurity: Not on file  ?Transportation Needs: Not on file  ?Physical Activity: Not on file  ?Stress: Not on file  ?Social Connections: Not on file  ? ?Outpatient Medications Prior to Visit  ?Medication Sig  ? EPINEPHrine (EPIPEN 2-PAK) 0.3 mg/0.3 mL IJ SOAJ injection Inject 0.3 mg into the muscle as needed for anaphylaxis.  ? ondansetron (ZOFRAN) 4 MG tablet Take 1 tablet (4 mg total) by mouth every 8 (eight) hours as needed for up  to 10 doses for nausea or vomiting.  ? prazosin (MINIPRESS) 5 MG capsule Take 15 mg by mouth daily.  ? albuterol (VENTOLIN HFA) 108 (90 Base) MCG/ACT inhaler Inhale 2 puffs into the lungs every 6 (six) hours as needed for wheezing or shortness of breath. (Patient not taking: Reported on 09/15/2021)  ? citalopram (CELEXA) 10 MG tablet Take 10 mg by mouth daily.  ? dicyclomine (BENTYL) 20 MG tablet Take 1 tablet (20 mg total) by mouth 3 (three) times daily as needed for spasms. (Patient not taking: Reported on 09/15/2021)  ? famotidine (PEPCID) 20 MG tablet Take 1 tablet (20 mg total) by mouth 2 (two) times daily.  ? levothyroxine (SYNTHROID) 75 MCG tablet Take 1 tablet (75 mcg total) by mouth daily. (Patient not  taking: Reported on 09/15/2021)  ? omeprazole (PRILOSEC) 40 MG capsule Take 1 capsule (40 mg total) by mouth daily.  ? Vitamin D, Ergocalciferol, (DRISDOL) 50000 units CAPS capsule Take 1 capsule (50,000 Units total) by mouth every 30 (thirty) days. (Patient not taking: Reported on 09/15/2021)  ? [DISCONTINUED] benzonatate (TESSALON PERLES) 100 MG capsule Take 1 capsule (100 mg total) by mouth 3 (three) times daily as needed for cough.  ? [DISCONTINUED] diclofenac sodium (VOLTAREN) 1 % GEL Use 2 grams on hand, 4 grams on knee; up to four times a day PRN pain  ? [DISCONTINUED] predniSONE (DELTASONE) 10 MG tablet Take 2 tablets (20 mg total) by mouth daily. (Patient not taking: Reported on 09/15/2021)  ? ?No facility-administered medications prior to visit.  ? ?Allergies  ?Allergen Reactions  ? Peanut-Containing Drug Products Shortness Of Breath  ? Penicillins Anaphylaxis  ?  Has patient had a PCN reaction causing immediate rash, facial/tongue/throat swelling, SOB or lightheadedness with hypotension: Yes ?Has patient had a PCN reaction causing severe rash involving mucus membranes or skin necrosis: No ?Has patient had a PCN reaction that required hospitalization Yes ?Has patient had a PCN reaction occurring within the last 10 years: No ?If all of the above answers are "NO", then may proceed with Cephalosporin use.  ? Aspirin Swelling  ? Dilantin  [Phenytoin] Nausea And Vomiting  ? Macrolides And Ketolides Swelling  ? Phenobarbital Nausea And Vomiting  ? ? ?Immunization History  ?Administered Date(s) Administered  ? Tdap 09/02/2015  ? ? ?Health Maintenance  ?Topic Date Due  ? COVID-19 Vaccine (1) Never done  ? HIV Screening  Never done  ? Hepatitis C Screening  Never done  ? PAP SMEAR-Modifier  Never done  ? MAMMOGRAM  Never done  ? Zoster Vaccines- Shingrix (1 of 2) Never done  ? Fecal DNA (Cologuard)  08/27/2018  ? INFLUENZA VACCINE  12/15/2021  ? TETANUS/TDAP  09/01/2025  ? HPV VACCINES  Aged Out  ? ? ?Patient Care  Team: ?Emelia Loron as PCP - General (Physician Assistant) ?Arnetha Courser, MD as Attending Physician (Family Medicine) ?Christene Lye, MD (General Surgery) ? ?Review of Systems  ?Constitutional:  Positive for activity change, appetite change, diaphoresis and unexpected weight change. Negative for chills, fatigue and fever.  ?HENT:  Positive for congestion, dental problem, ear pain, rhinorrhea, sinus pressure, sinus pain and voice change. Negative for sneezing and sore throat.   ?Eyes:  Positive for photophobia, pain, redness, itching and visual disturbance.  ?Respiratory:  Positive for apnea, cough and wheezing. Negative for shortness of breath.   ?Gastrointestinal:  Positive for abdominal distention, abdominal pain, anal bleeding, blood in stool, constipation, diarrhea, nausea and rectal pain.  ?Endocrine:  Positive for cold intolerance and polyuria. Negative for polydipsia and polyphagia.  ?Genitourinary:  Positive for difficulty urinating and flank pain. Negative for dysuria, hematuria, pelvic pain, vaginal bleeding and vaginal discharge.  ?Musculoskeletal:  Positive for arthralgias, back pain, gait problem, joint swelling, myalgias, neck pain and neck stiffness.  ?Skin:  Negative for rash.  ?Allergic/Immunologic: Positive for environmental allergies.  ?Neurological:  Positive for weakness.  ?Hematological:  Positive for adenopathy. Bruises/bleeds easily.  ?Psychiatric/Behavioral:  Positive for agitation, confusion, decreased concentration, dysphoric mood, sleep disturbance and suicidal ideas. Negative for behavioral problems. The patient is nervous/anxious. The patient is not hyperactive.   ? ? ? ? Objective  ?  ?BP 122/62 (BP Location: Right Arm, Patient Position: Sitting, Cuff Size: Normal)   Pulse (!) 51   Temp 98.5 ?F (36.9 ?C) (Oral)   Resp 16   Ht 5' 4.25" (1.632 m)   Wt 161 lb 14.4 oz (73.4 kg)   SpO2 100% Comment: room air  BMI 27.57 kg/m?  ? ?Physical Exam ?Vitals reviewed.   ?Constitutional:   ?   General: She is not in acute distress. ?   Appearance: Normal appearance. She is well-developed. She is not diaphoretic.  ?HENT:  ?   Head: Normocephalic and atraumatic.  ?   Right Ear: Tympanic membr

## 2021-09-16 ENCOUNTER — Telehealth: Payer: Self-pay | Admitting: Physician Assistant

## 2021-09-16 ENCOUNTER — Other Ambulatory Visit: Payer: Self-pay | Admitting: Physician Assistant

## 2021-09-16 LAB — HEMOGLOBIN A1C
Est. average glucose Bld gHb Est-mCnc: 123 mg/dL
Hgb A1c MFr Bld: 5.9 % — ABNORMAL HIGH (ref 4.8–5.6)

## 2021-09-16 LAB — CBC
Hematocrit: 35.2 % (ref 34.0–46.6)
Hemoglobin: 11.2 g/dL (ref 11.1–15.9)
MCH: 23.9 pg — ABNORMAL LOW (ref 26.6–33.0)
MCHC: 31.8 g/dL (ref 31.5–35.7)
MCV: 75 fL — ABNORMAL LOW (ref 79–97)
Platelets: 209 10*3/uL (ref 150–450)
RBC: 4.68 x10E6/uL (ref 3.77–5.28)
RDW: 15.2 % (ref 11.7–15.4)
WBC: 4.1 10*3/uL (ref 3.4–10.8)

## 2021-09-16 LAB — COMPREHENSIVE METABOLIC PANEL
ALT: 23 IU/L (ref 0–32)
AST: 38 IU/L (ref 0–40)
Albumin/Globulin Ratio: 1.6 (ref 1.2–2.2)
Albumin: 4.7 g/dL (ref 3.8–4.8)
Alkaline Phosphatase: 44 IU/L (ref 44–121)
BUN/Creatinine Ratio: 12 (ref 12–28)
BUN: 14 mg/dL (ref 8–27)
Bilirubin Total: 0.4 mg/dL (ref 0.0–1.2)
CO2: 27 mmol/L (ref 20–29)
Calcium: 9.6 mg/dL (ref 8.7–10.3)
Chloride: 103 mmol/L (ref 96–106)
Creatinine, Ser: 1.13 mg/dL — ABNORMAL HIGH (ref 0.57–1.00)
Globulin, Total: 3 g/dL (ref 1.5–4.5)
Glucose: 88 mg/dL (ref 70–99)
Potassium: 4.1 mmol/L (ref 3.5–5.2)
Sodium: 143 mmol/L (ref 134–144)
Total Protein: 7.7 g/dL (ref 6.0–8.5)
eGFR: 55 mL/min/{1.73_m2} — ABNORMAL LOW (ref >59)

## 2021-09-16 LAB — LIPID PANEL
Chol/HDL Ratio: 3.7 ratio (ref 0.0–4.4)
Cholesterol, Total: 390 mg/dL — ABNORMAL HIGH (ref 100–199)
HDL: 106 mg/dL (ref >39)
LDL Chol Calc (NIH): 272 mg/dL — ABNORMAL HIGH (ref 0–99)
Triglycerides: 86 mg/dL (ref 0–149)
VLDL Cholesterol Cal: 12 mg/dL (ref 5–40)

## 2021-09-16 LAB — VITAMIN D 25 HYDROXY (VIT D DEFICIENCY, FRACTURES): Vit D, 25-Hydroxy: 10.2 ng/mL — ABNORMAL LOW (ref 30.0–100.0)

## 2021-09-16 LAB — TSH: TSH: 63.4 u[IU]/mL — ABNORMAL HIGH (ref 0.450–4.500)

## 2021-09-16 MED ORDER — ALBUTEROL SULFATE HFA 108 (90 BASE) MCG/ACT IN AERS: 2.0000 | INHALATION_SPRAY | Freq: Four times a day (QID) | RESPIRATORY_TRACT | 2 refills | Status: DC | PRN

## 2021-09-16 NOTE — Telephone Encounter (Signed)
I called [facility] customer service and was advised that the TB test cannot be added to yesterdays specimen because it requires a special kit.  ?

## 2021-09-16 NOTE — Progress Notes (Signed)
Please, let know patient that she needs to start levothyroxine. Medication is sent to her designated pharmacy

## 2021-09-16 NOTE — Telephone Encounter (Signed)
Please contact [facility] regarding an extra lab for TB ?And please, call patient and let her know that she need to do CXR at [facility]. Thank you ?

## 2021-09-17 ENCOUNTER — Other Ambulatory Visit: Payer: Self-pay | Admitting: Physician Assistant

## 2021-09-17 ENCOUNTER — Telehealth: Payer: Self-pay

## 2021-09-17 DIAGNOSIS — E039 Hypothyroidism, unspecified: Secondary | ICD-10-CM

## 2021-09-17 MED ORDER — LEVOTHYROXINE SODIUM 100 MCG PO TABS: 100.0000 ug | ORAL_TABLET | Freq: Every day | ORAL | 1 refills | Status: DC

## 2021-09-17 NOTE — Telephone Encounter (Signed)
-----   Message from Alanson Puls, [place] sent at 09/17/2021 10:44 AM EDT ----- ?[facility] okay for [facility] triage to advise. ?

## 2021-09-17 NOTE — Progress Notes (Signed)
Fu in 1 mo with her PCP ?

## 2021-09-17 NOTE — Telephone Encounter (Signed)
[facility] to see if they can deliver the medication and Cristie Hem, tech states they can deliver they just need to figure out how pt is paying for medications. I advised her if she can to reach out to the patient for this information. She states she would and had a different number on file so gave the number from our chart and she states she would try pt on this number.  ?

## 2021-11-16 ENCOUNTER — Other Ambulatory Visit: Payer: Self-pay

## 2021-11-16 ENCOUNTER — Other Ambulatory Visit: Payer: Medicaid Other

## 2021-12-15 ENCOUNTER — Other Ambulatory Visit: Payer: Medicaid Other

## 2022-01-01 ENCOUNTER — Other Ambulatory Visit: Payer: Medicaid Other

## 2022-01-14 ENCOUNTER — Ambulatory Visit: Payer: Self-pay

## 2022-01-14 ENCOUNTER — Ambulatory Visit: Payer: Medicaid Other | Admitting: Internal Medicine
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
## 2022-01-15 ENCOUNTER — Ambulatory Visit: Payer: Medicaid Other | Admitting: Physician Assistant

## 2022-01-15 NOTE — Telephone Encounter (Signed)
Tried calling patient. Left message to call back. OK for PEC triage to advise.  ?

## 2022-01-19 ENCOUNTER — Other Ambulatory Visit: Payer: Medicaid Other

## 2022-01-20 ENCOUNTER — Ambulatory Visit: Payer: Medicaid Other | Admitting: Physician Assistant

## 2022-01-20 NOTE — Telephone Encounter (Signed)
Pt no-showed appt 01-20-22.
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
## 2022-01-20 NOTE — Telephone Encounter (Signed)
Can I close this encounter?

## 2022-01-21 ENCOUNTER — Observation Stay
Admission: EM | Admit: 2022-01-21 | Discharge: 2022-01-22 | Disposition: A | Discharge status: Home / Self Care | Payer: Medicaid Other | Attending: Internal Medicine | Admitting: Internal Medicine

## 2022-01-21 ENCOUNTER — Other Ambulatory Visit: Payer: Self-pay

## 2022-01-21 ENCOUNTER — Emergency Department: Payer: Medicaid Other

## 2022-01-21 DIAGNOSIS — R001 Bradycardia, unspecified: Principal | ICD-10-CM | POA: Diagnosis present

## 2022-01-21 DIAGNOSIS — W19XXXA Unspecified fall, initial encounter: Secondary | ICD-10-CM

## 2022-01-21 DIAGNOSIS — Z8572 Personal history of non-Hodgkin lymphomas: Secondary | ICD-10-CM | POA: Insufficient documentation

## 2022-01-21 DIAGNOSIS — F331 Major depressive disorder, recurrent, moderate: Secondary | ICD-10-CM | POA: Diagnosis present

## 2022-01-21 DIAGNOSIS — R531 Weakness: Secondary | ICD-10-CM

## 2022-01-21 DIAGNOSIS — Z8509 Personal history of malignant neoplasm of other digestive organs: Secondary | ICD-10-CM | POA: Insufficient documentation

## 2022-01-21 DIAGNOSIS — E039 Hypothyroidism, unspecified: Secondary | ICD-10-CM | POA: Diagnosis present

## 2022-01-21 DIAGNOSIS — F17210 Nicotine dependence, cigarettes, uncomplicated: Secondary | ICD-10-CM | POA: Insufficient documentation

## 2022-01-21 DIAGNOSIS — Z853 Personal history of malignant neoplasm of breast: Secondary | ICD-10-CM | POA: Diagnosis not present

## 2022-01-21 DIAGNOSIS — R42 Dizziness and giddiness: Secondary | ICD-10-CM | POA: Insufficient documentation

## 2022-01-21 DIAGNOSIS — N390 Urinary tract infection, site not specified: Secondary | ICD-10-CM

## 2022-01-21 DIAGNOSIS — Z91010 Allergy to peanuts: Secondary | ICD-10-CM | POA: Diagnosis not present

## 2022-01-21 DIAGNOSIS — B9620 Unspecified Escherichia coli [E. coli] as the cause of diseases classified elsewhere: Secondary | ICD-10-CM | POA: Diagnosis not present

## 2022-01-21 DIAGNOSIS — Z20822 Contact with and (suspected) exposure to covid-19: Secondary | ICD-10-CM | POA: Insufficient documentation

## 2022-01-21 DIAGNOSIS — K219 Gastro-esophageal reflux disease without esophagitis: Secondary | ICD-10-CM

## 2022-01-21 DIAGNOSIS — Z79899 Other long term (current) drug therapy: Secondary | ICD-10-CM | POA: Diagnosis not present

## 2022-01-21 DIAGNOSIS — I10 Essential (primary) hypertension: Secondary | ICD-10-CM | POA: Diagnosis not present

## 2022-01-21 DIAGNOSIS — R2991 Unspecified symptoms and signs involving the musculoskeletal system: Secondary | ICD-10-CM | POA: Insufficient documentation

## 2022-01-21 DIAGNOSIS — R0789 Other chest pain: Secondary | ICD-10-CM | POA: Diagnosis present

## 2022-01-21 LAB — CBC
HCT: 39.8 % (ref 36.0–46.0)
Hemoglobin: 12.5 g/dL (ref 12.0–15.0)
MCH: 23.1 pg — ABNORMAL LOW (ref 26.0–34.0)
MCHC: 31.4 g/dL (ref 30.0–36.0)
MCV: 73.6 fL — ABNORMAL LOW (ref 80.0–100.0)
Platelets: 207 10*3/uL (ref 150–400)
RBC: 5.41 MIL/uL — ABNORMAL HIGH (ref 3.87–5.11)
RDW: 14.6 % (ref 11.5–15.5)
WBC: 2.9 10*3/uL — ABNORMAL LOW (ref 4.0–10.5)
nRBC: 0 % (ref 0.0–0.2)

## 2022-01-21 LAB — BASIC METABOLIC PANEL
Anion gap: 6 (ref 5–15)
BUN: 13 mg/dL (ref 8–23)
CO2: 28 mmol/L (ref 22–32)
Calcium: 9.4 mg/dL (ref 8.9–10.3)
Chloride: 106 mmol/L (ref 98–111)
Creatinine, Ser: 1.1 mg/dL — ABNORMAL HIGH (ref 0.44–1.00)
GFR, Estimated: 56 mL/min — ABNORMAL LOW (ref >60)
Glucose, Bld: 75 mg/dL (ref 70–99)
Potassium: 4 mmol/L (ref 3.5–5.1)
Sodium: 140 mmol/L (ref 135–145)

## 2022-01-21 MED ORDER — LEVOTHYROXINE SODIUM 100 MCG PO TABS
100.0000 ug | ORAL_TABLET | Freq: Every day | ORAL | Status: DC
Start: 2022-01-22 — End: 2022-01-22
  Administered 2022-01-22: 100 ug via ORAL
  Filled 2022-01-21: qty 1

## 2022-01-21 MED ORDER — QUETIAPINE FUMARATE 25 MG PO TABS: 100.0000 mg | ORAL_TABLET | Freq: Every day | ORAL | Status: DC

## 2022-01-21 MED ORDER — FAMOTIDINE 20 MG PO TABS
20.0000 mg | ORAL_TABLET | Freq: Two times a day (BID) | ORAL | Status: DC
  Administered 2022-01-21 – 2022-01-22 (×3): 20 mg via ORAL
  Filled 2022-01-21 (×3): qty 1

## 2022-01-21 MED ORDER — TRAMADOL HCL 50 MG PO TABS
100.0000 mg | ORAL_TABLET | Freq: Once | ORAL | Status: AC
  Administered 2022-01-21: 100 mg via ORAL
  Filled 2022-01-21: qty 2

## 2022-01-21 MED ORDER — ALBUTEROL SULFATE HFA 108 (90 BASE) MCG/ACT IN AERS: 2.0000 | INHALATION_SPRAY | Freq: Four times a day (QID) | RESPIRATORY_TRACT | Status: DC | PRN

## 2022-01-21 MED ORDER — LEVOTHYROXINE SODIUM 50 MCG PO TABS
100.0000 ug | ORAL_TABLET | Freq: Every day | ORAL | Status: DC
  Administered 2022-01-21: 100 ug via ORAL
  Filled 2022-01-21: qty 2

## 2022-01-21 MED ORDER — CITALOPRAM HYDROBROMIDE 20 MG PO TABS
10.0000 mg | ORAL_TABLET | Freq: Every day | ORAL | Status: DC
  Administered 2022-01-21 – 2022-01-22 (×2): 10 mg via ORAL
  Filled 2022-01-21 (×2): qty 1
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
## 2022-01-21 NOTE — TOC Initial Note (Signed)
Transition of Care [facility]) - Initial/Assessment Note    Patient Details  Name: Helen Reid MRN: [id number] Date of Birth: May 12, 1961  [facility] CM/SW Contact:    Shelbie Hutching, RN Phone Number: 01/21/2022, 6:06 PM  Clinical Narrative:                 Patient came into the emergency room after falling several times.  She is having extreme difficulty with ambulation, required 2+ assist with nursing staff earlier today.  RNCM met with patient at the bedside, she had a friend with her that she referred to as her adopted mother, Inez Catalina.  Patient has children but they do not live in [place].  Patient is from home where she lives alone, she walks with a cane.  She reports that she has a Barrister's clerk named Mateo [phone].  Mateo Flow was supposed to be coming to see her tomorrow but since she will be in the hospital patient says Mateo Flow will come see her here.  Patient does not drive.  PT evaluation has been ordered.  Explained to patient that we wanted her to be safe at home and right now she can't walk.  She agrees to short term rehab.  RNCM will start bed search.   Expected Discharge Plan: Skilled Nursing Facility Barriers to Discharge: SNF Pending bed offer   Patient Goals and CMS Choice Patient states their goals for this hospitalization and ongoing recovery are:: she agrees to short term rehab CMS Medicare.gov Compare Post Acute Care list provided to:: Patient Choice offered to / list presented to : Patient  Expected Discharge Plan and Services Expected Discharge Plan: [place]   Discharge Planning Services: CM Consult Post Acute Care Choice: [place] Living arrangements for the past 2 months: Apartment                                      Prior Living Arrangements/Services Living arrangements for the past 2 months: Apartment Lives with:: Self Patient language and need for interpreter reviewed:: Yes Do you feel safe going  back to the place where you live?: Yes      Need for Family Participation in Patient Care: Yes (Comment) Care giver support system in place?: Yes (comment) Current home services: DME (cane) Criminal Activity/Legal Involvement Pertinent to Current Situation/Hospitalization: No - Comment as needed  Activities of Daily Living      Permission Sought/Granted Permission sought to share information with : Case Manager, Family Supports Permission granted to share information with : Yes, Verbal Permission Granted  Share Information with NAME: Subha Ahmed  Permission granted to share info w AGENCY: SNF's  Permission granted to share info w Relationship: daughter  Permission granted to share info w Contact Information: [phone]  Emotional Assessment Appearance:: Appears stated age Attitude/Demeanor/Rapport: Engaged Affect (typically observed): Accepting Orientation: : Oriented to Self, Oriented to Place, Oriented to  Time, Oriented to Situation Alcohol / Substance Use: Not Applicable Psych Involvement: Outpatient Provider  Admission diagnosis:  fall Patient Active Problem List   Diagnosis Date Noted   Abnormal vaginal bleeding 11/24/2015   Noncompliance with medications 11/24/2015   Right knee pain 11/03/2015   Vitamin D deficiency 09/27/2015   Colon abnormality 09/09/2015   Pain and swelling of right lower leg 09/02/2015   Bradycardia 09/02/2015   Need for Tdap vaccination 09/02/2015   Dermatitis 08/21/2015   Anemia 08/21/2015  Pain, joint, multiple sites 08/21/2015   PTSD (post-traumatic stress disorder) 05/29/2015   Borderline personality disorder ([place]) 05/29/2015   Tobacco use disorder 05/26/2015   Major depressive disorder, recurrent episode, moderate ([place]) 05/26/2015   Cannabis use disorder, moderate, dependence ([place]) 05/26/2015   CAFL (chronic airflow limitation) ([place]) 10/17/2014   Chronic kidney disease (CKD), stage II (mild) 10/17/2014   Hypertriglyceridemia  10/17/2014   Adult hypothyroidism 10/17/2014   Irritable bowel syndrome with constipation 10/17/2014   AI (aortic incompetence) 10/17/2014   Arthralgia of multiple joints 10/17/2014   PCP:  [name], PA-C Pharmacy:   [facility], [address] [place] [address] Phone: [phone] Fax: [phone]  [id number] [place] [id number]: [phone]: [phone]    Social Determinants of Health (SDOH) Interventions    Readmission Risk Interventions     No data to display

## 2022-01-21 NOTE — ED Provider Notes (Signed)
[facility] Provider Note    Event Date/Time   First MD Initiated Contact with Patient 01/21/22 1009     (approximate)  History   Chief Complaint: Fall  HPI  Tari Lecount is a 61 y.o. female with a past medical history of hypertension, anxiety, presents to the emergency department after 2 falls.  According to the patient she had 2 recent falls.  Patient states she has a history of frequent falls.  Patient states she used to take ibuprofen but developed an allergy, now can no longer take Tylenol.  Patient states she is having some pain in her left lateral ribs and left hip but has been ambulatory since the fall.  Denies hitting her head.  Denies LOC.  Patient states she uses a cane when she ambulates.  Physical Exam   Triage Vital Signs: ED Triage Vitals  Enc Vitals Group     BP 01/21/22 0842 (!) 171/91     Pulse Rate 01/21/22 0841 87     Resp 01/21/22 0841 18     Temp 01/21/22 0841 (!) 97.5 F (36.4 C)     Temp Source 01/21/22 0841 Oral     SpO2 01/21/22 0841 96 %     Weight 01/21/22 0842 161 lb 13.1 oz (73.4 kg)     Height 01/21/22 0842 '5\' 4"'$  (1.626 m)     Head Circumference --      Peak Flow --      Pain Score 01/21/22 0842 10     Pain Loc --      Pain Edu? --      Excl. in [place]? --     Most recent vital signs: Vitals:   01/21/22 0841 01/21/22 0842  BP:  (!) 171/91  Pulse: 87   Resp: 18   Temp: (!) 97.5 F (36.4 C)   SpO2: 96%     General: Awake, no distress.  CV:  Good peripheral perfusion.  Regular rate and rhythm  Resp:  Normal effort.  Equal breath sounds bilaterally.  Abd:  No distention.  Soft, nontender.  No rebound or guarding. Other:  Mild tenderness palpation of the left hip but good range of motion.  No concern for fracture or dislocation.  No obvious traumatic findings on exam.   ED Results / Procedures / Treatments   EKG  EKG viewed and interpreted by myself shows sinus bradycardia 52 bpm with a narrow QRS, normal  axis, normal intervals, nonspecific but no concerning ST changes.  RADIOLOGY  I have reviewed and interpreted the hip x-ray images.  I do not see any obvious fracture or dislocation on my evaluation. Radiology is read the hip x-ray is negative Rib/chest x-ray negative   MEDICATIONS ORDERED IN ED: Medications  traMADol (ULTRAM) tablet 100 mg (has no administration in time range)     IMPRESSION / MDM / ASSESSMENT AND PLAN / ED COURSE  I reviewed the triage vital signs and the nursing notes.  Patient's presentation is most consistent with acute presentation with potential threat to life or bodily function.  Patient presents emergency department for 2 falls yesterday now with left hip pain.  Patient states a history of chronic pain which she has been told is due to bad arthritis.  Patient also states a history of frequent falls.  Patient had 2 falls yesterday.  No obvious traumatic findings on exam.  Patient is complaining of pain mostly in the left hip but good range of motion.  X-rays are  negative for fracture.  CBC is reassuring, chemistry is normal.  Patient states she cannot take Tylenol or ibuprofen due to allergies.  We will trial tramadol in the emergency department.  We will attempt to ambulate the patient after tramadol.  Lungs the patient is able to ambulate I believe she would be safe for discharge home.  Patient states she has home health starting next week.  I would prescribe a short course of tramadol for the patient.  Patient was unable to safely ambulate.  Requiring 2 nurse assistance to get from the bed to the toilet approximately 15 feet.  We will consult social work and PT for evaluation for possible rehab SNF placement or additional home health needs.  FINAL CLINICAL IMPRESSION(S) / ED DIAGNOSES   Fall Musculoskeletal pain   Note:  This document was prepared using Dragon voice recognition software and may include unintentional dictation errors.   Harvest Dark,  MD 01/21/22 1109

## 2022-01-21 NOTE — NC FL2 (Signed)
[place] LEVEL OF CARE SCREENING TOOL     IDENTIFICATION  Patient Name: Helen Reid Birthdate: July 08, 1960 Sex: female Admission Date (Current Location): 01/21/2022  [place] and [place] Number:  Engineering geologist and Address:  [facility], [address], [place], [place] [address]      Provider Number: [id number]  Attending Physician Name and Address:  No att. providers found  Relative Name and Phone Number:  Renaldo Reel- daughter- [phone]    Current Level of Care: Other (Comment) (ED placement) Recommended Level of Care: [place] Prior Approval Number:    Date Approved/Denied:   PASRR Number: pending  Discharge Plan: SNF    Current Diagnoses: Patient Active Problem List   Diagnosis Date Noted   Abnormal vaginal bleeding 11/24/2015   Noncompliance with medications 11/24/2015   Right knee pain 11/03/2015   Vitamin D deficiency 09/27/2015   Colon abnormality 09/09/2015   Pain and swelling of right lower leg 09/02/2015   Bradycardia 09/02/2015   Need for Tdap vaccination 09/02/2015   Dermatitis 08/21/2015   Anemia 08/21/2015   Pain, joint, multiple sites 08/21/2015   PTSD (post-traumatic stress disorder) 05/29/2015   Borderline personality disorder ([place]) 05/29/2015   Tobacco use disorder 05/26/2015   Major depressive disorder, recurrent episode, moderate ([facility]) 05/26/2015   Cannabis use disorder, moderate, dependence ([place]) 05/26/2015   CAFL (chronic airflow limitation) (H[facility]) 10/17/2014   Chronic kidney disease (CKD), stage II (mild) 10/17/2014   Hypertriglyceridemia 10/17/2014   Adult hypothyroidism 10/17/2014   Irritable bowel syndrome with constipation 10/17/2014   AI (aortic incompetence) 10/17/2014   Arthralgia of multiple joints 10/17/2014    Orientation RESPIRATION BLADDER Height & Weight     Self, Time, Situation, Place  Normal Continent Weight: 73.4 kg Height:  '5\' 4"'$  (162.6 cm)   BEHAVIORAL SYMPTOMS/MOOD NEUROLOGICAL BOWEL NUTRITION STATUS      Continent Diet (Regular)  AMBULATORY STATUS COMMUNICATION OF NEEDS Skin   Extensive Assist Verbally Normal                       Personal Care Assistance Level of Assistance  Bathing, Feeding, Dressing Bathing Assistance: Limited assistance Feeding assistance: Limited assistance Dressing Assistance: Limited assistance     Functional Limitations Info  Sight, Hearing, Speech Sight Info: Adequate Hearing Info: Adequate Speech Info: Adequate    SPECIAL CARE FACTORS FREQUENCY  PT (By licensed PT), OT (By licensed OT)     PT Frequency: 5 times per week OT Frequency: 5 times per week            Contractures Contractures Info: Not present    Additional Factors Info  Code Status, Allergies, Psychotropic Code Status Info: Full Allergies Info: Peanut contining products, PCN, ASA, dilantin, macrolides, and ketolides, phenobarbital           Current Medications (01/21/2022):  This is the current hospital active medication list Current Facility-Administered Medications  Medication Dose Route Frequency Provider Last Rate Last Admin   citalopram (CELEXA) tablet 10 mg  10 mg Oral Daily Harvest Dark, MD   10 mg at 01/21/22 1526   famotidine (PEPCID) tablet 20 mg  20 mg Oral BID Harvest Dark, MD   20 mg at 01/21/22 1527   [START ON 01/22/2022] levothyroxine (SYNTHROID) tablet 100 mcg  100 mcg Oral Daily Noralee Space, [facility]       Current Outpatient Medications  Medication Sig Dispense Refill   albuterol (VENTOLIN HFA) 108 (90 Base)  MCG/ACT inhaler Inhale 2 puffs into the lungs every 6 (six) hours as needed for wheezing or shortness of breath. 8 g 2   levothyroxine (SYNTHROID) 100 MCG tablet Take 1 tablet (100 mcg total) by mouth daily. 30 tablet 1   QUEtiapine (SEROQUEL) 100 MG tablet Take 100 mg by mouth at bedtime.     citalopram (CELEXA) 10 MG tablet Take 10 mg by mouth daily.     dicyclomine  (BENTYL) 20 MG tablet Take 1 tablet (20 mg total) by mouth 3 (three) times daily as needed for spasms. (Patient not taking: Reported on 09/15/2021) 30 tablet 0   EPINEPHrine (EPIPEN 2-PAK) 0.3 mg/0.3 mL IJ SOAJ injection Inject 0.3 mg into the muscle as needed for anaphylaxis. (Patient not taking: Reported on 01/21/2022) 1 each 2   famotidine (PEPCID) 20 MG tablet Take 1 tablet (20 mg total) by mouth 2 (two) times daily. 60 tablet 0   fluticasone-salmeterol (ADVAIR) 100-50 MCG/ACT AEPB Inhale 1 puff into the lungs 2 (two) times daily. (Patient not taking: Reported on 01/21/2022) 1 each 3   omeprazole (PRILOSEC) 40 MG capsule Take 1 capsule (40 mg total) by mouth daily. 30 capsule 0   ondansetron (ZOFRAN) 4 MG tablet Take 1 tablet (4 mg total) by mouth every 8 (eight) hours as needed for up to 10 doses for nausea or vomiting. (Patient not taking: Reported on 01/21/2022) 10 tablet 0   prazosin (MINIPRESS) 5 MG capsule Take 15 mg by mouth daily. (Patient not taking: Reported on 01/21/2022)     Vitamin D, Ergocalciferol, (DRISDOL) 50000 units CAPS capsule Take 1 capsule (50,000 Units total) by mouth every 30 (thirty) days. (Patient not taking: Reported on 09/15/2021) 1 capsule 2     Discharge Medications: Please see discharge summary for a list of discharge medications.  Relevant Imaging Results:  Relevant Lab Results:   Additional Information SS# [id number]  Shelbie Hutching, RN

## 2022-01-21 NOTE — ED Notes (Signed)
When walking pt to bathroom , pt very unsteady

## 2022-01-22 ENCOUNTER — Other Ambulatory Visit: Payer: Self-pay

## 2022-01-22 ENCOUNTER — Observation Stay (HOSPITAL_BASED_OUTPATIENT_CLINIC_OR_DEPARTMENT_OTHER)
Admit: 2022-01-22 | Discharge: 2022-01-22 | Disposition: A | Discharge status: Home / Self Care | Payer: Medicaid Other | Attending: Physician Assistant | Admitting: Physician Assistant

## 2022-01-22 ENCOUNTER — Emergency Department: Payer: Medicaid Other

## 2022-01-22 DIAGNOSIS — K219 Gastro-esophageal reflux disease without esophagitis: Secondary | ICD-10-CM

## 2022-01-22 DIAGNOSIS — R001 Bradycardia, unspecified: Secondary | ICD-10-CM

## 2022-01-22 DIAGNOSIS — E039 Hypothyroidism, unspecified: Secondary | ICD-10-CM

## 2022-01-22 DIAGNOSIS — R079 Chest pain, unspecified: Secondary | ICD-10-CM

## 2022-01-22 DIAGNOSIS — N390 Urinary tract infection, site not specified: Secondary | ICD-10-CM

## 2022-01-22 DIAGNOSIS — F331 Major depressive disorder, recurrent, moderate: Secondary | ICD-10-CM

## 2022-01-22 DIAGNOSIS — A419 Sepsis, unspecified organism: Secondary | ICD-10-CM | POA: Insufficient documentation

## 2022-01-22 LAB — CBC WITH DIFFERENTIAL/PLATELET
Abs Immature Granulocytes: 0.01 10*3/uL (ref 0.00–0.07)
Basophils Absolute: 0.1 10*3/uL (ref 0.0–0.1)
Basophils Relative: 2 %
Eosinophils Absolute: 0 10*3/uL (ref 0.0–0.5)
Eosinophils Relative: 1 %
HCT: 39.6 % (ref 36.0–46.0)
Hemoglobin: 12.3 g/dL (ref 12.0–15.0)
Immature Granulocytes: 0 %
Lymphocytes Relative: 40 %
Lymphs Abs: 1.2 10*3/uL (ref 0.7–4.0)
MCH: 23.1 pg — ABNORMAL LOW (ref 26.0–34.0)
MCHC: 31.1 g/dL (ref 30.0–36.0)
MCV: 74.4 fL — ABNORMAL LOW (ref 80.0–100.0)
Monocytes Absolute: 0.2 10*3/uL (ref 0.1–1.0)
Monocytes Relative: 8 %
Neutro Abs: 1.4 10*3/uL — ABNORMAL LOW (ref 1.7–7.7)
Neutrophils Relative %: 49 %
Platelets: 192 10*3/uL (ref 150–400)
RBC: 5.32 MIL/uL — ABNORMAL HIGH (ref 3.87–5.11)
RDW: 14.7 % (ref 11.5–15.5)
WBC: 3 10*3/uL — ABNORMAL LOW (ref 4.0–10.5)
nRBC: 0 % (ref 0.0–0.2)

## 2022-01-22 LAB — URINALYSIS, ROUTINE W REFLEX MICROSCOPIC
Bilirubin Urine: NEGATIVE
Glucose, UA: NEGATIVE mg/dL
Hgb urine dipstick: NEGATIVE
Ketones, ur: NEGATIVE mg/dL
Nitrite: NEGATIVE
Protein, ur: NEGATIVE mg/dL
Specific Gravity, Urine: 1.019 (ref 1.005–1.030)
pH: 6 (ref 5.0–8.0)

## 2022-01-22 LAB — CBC
HCT: 35.8 % — ABNORMAL LOW (ref 36.0–46.0)
Hemoglobin: 11.3 g/dL — ABNORMAL LOW (ref 12.0–15.0)
MCH: 23.6 pg — ABNORMAL LOW (ref 26.0–34.0)
MCHC: 31.6 g/dL (ref 30.0–36.0)
MCV: 74.9 fL — ABNORMAL LOW (ref 80.0–100.0)
Platelets: 134 10*3/uL — ABNORMAL LOW (ref 150–400)
RBC: 4.78 MIL/uL (ref 3.87–5.11)
RDW: 16.4 % — ABNORMAL HIGH (ref 11.5–15.5)
WBC: 2.7 10*3/uL — ABNORMAL LOW (ref 4.0–10.5)
nRBC: 0 % (ref 0.0–0.2)

## 2022-01-22 LAB — URINE DRUG SCREEN, QUALITATIVE (ARMC ONLY)
Amphetamines, Ur Screen: NOT DETECTED
Barbiturates, Ur Screen: NOT DETECTED
Benzodiazepine, Ur Scrn: NOT DETECTED
Cannabinoid 50 Ng, Ur ~~LOC~~: POSITIVE — AB
Cocaine Metabolite,Ur ~~LOC~~: NOT DETECTED
MDMA (Ecstasy)Ur Screen: NOT DETECTED
Methadone Scn, Ur: NOT DETECTED
Opiate, Ur Screen: NOT DETECTED
Phencyclidine (PCP) Ur S: NOT DETECTED
Tricyclic, Ur Screen: NOT DETECTED

## 2022-01-22 LAB — T4, FREE: Free T4: 0.26 ng/dL — ABNORMAL LOW (ref 0.61–1.12)

## 2022-01-22 LAB — BASIC METABOLIC PANEL
Anion gap: 5 (ref 5–15)
BUN: 19 mg/dL (ref 8–23)
CO2: 28 mmol/L (ref 22–32)
Calcium: 9.4 mg/dL (ref 8.9–10.3)
Chloride: 105 mmol/L (ref 98–111)
Creatinine, Ser: 1.18 mg/dL — ABNORMAL HIGH (ref 0.44–1.00)
GFR, Estimated: 52 mL/min — ABNORMAL LOW (ref >60)
Glucose, Bld: 88 mg/dL (ref 70–99)
Potassium: 4 mmol/L (ref 3.5–5.1)
Sodium: 138 mmol/L (ref 135–145)

## 2022-01-22 LAB — ECHOCARDIOGRAM COMPLETE
AR max vel: 3.03 cm2
AV Area VTI: 3.27 cm2
AV Area mean vel: 3.41 cm2
AV Mean grad: 4 mmHg
AV Peak grad: 8 mmHg
Ao pk vel: 1.41 m/s
Area-P 1/2: 3.13 cm2
Height: 64 in
P 1/2 time: 435 msec
S' Lateral: 2.5 cm
Weight: 2589.08 oz

## 2022-01-22 LAB — TROPONIN I (HIGH SENSITIVITY)
Troponin I (High Sensitivity): 5 ng/L (ref <18)
Troponin I (High Sensitivity): 5 ng/L (ref <18)
Troponin I (High Sensitivity): 4 ng/L (ref <18)

## 2022-01-22 LAB — MAGNESIUM
Magnesium: 2.3 mg/dL (ref 1.7–2.4)
Magnesium: 2.4 mg/dL (ref 1.7–2.4)

## 2022-01-22 LAB — SARS CORONAVIRUS 2 BY RT PCR: SARS Coronavirus 2 by RT PCR: NEGATIVE

## 2022-01-22 LAB — TSH: TSH: 73.102 u[IU]/mL — ABNORMAL HIGH (ref 0.350–4.500)

## 2022-01-22 MED ORDER — LEVOTHYROXINE SODIUM 25 MCG PO TABS
25.0000 ug | ORAL_TABLET | Freq: Every day | ORAL | Status: DC
  Administered 2022-01-22: 25 ug via ORAL
  Filled 2022-01-22: qty 1

## 2022-01-22 MED ORDER — ACETAMINOPHEN 650 MG RE SUPP: 650.0000 mg | Freq: Four times a day (QID) | RECTAL | Status: DC | PRN

## 2022-01-22 MED ORDER — ATROPINE SULFATE 1 MG/10ML IJ SOSY
PREFILLED_SYRINGE | INTRAMUSCULAR | Status: AC
  Administered 2022-01-22: 0.5 mg
  Filled 2022-01-22: qty 10

## 2022-01-22 MED ORDER — ATROPINE SULFATE 1 MG/10ML IJ SOSY
0.5000 mg | PREFILLED_SYRINGE | Freq: Once | INTRAMUSCULAR | Status: DC | PRN
Start: 2022-01-22 — End: 2022-01-22

## 2022-01-22 MED ORDER — LEVOTHYROXINE SODIUM 100 MCG PO TABS: 100.0000 ug | ORAL_TABLET | Freq: Every day | ORAL | 2 refills | Status: DC

## 2022-01-22 MED ORDER — PANTOPRAZOLE SODIUM 40 MG PO TBEC
40.0000 mg | DELAYED_RELEASE_TABLET | Freq: Every day | ORAL | Status: DC
  Administered 2022-01-22: 40 mg via ORAL
  Filled 2022-01-22: qty 1

## 2022-01-22 MED ORDER — ENOXAPARIN SODIUM 40 MG/0.4ML IJ SOSY
40.0000 mg | PREFILLED_SYRINGE | INTRAMUSCULAR | Status: DC
  Administered 2022-01-22: 40 mg via SUBCUTANEOUS
  Filled 2022-01-22: qty 0.4

## 2022-01-22 MED ORDER — ONDANSETRON HCL 4 MG/2ML IJ SOLN: 4.0000 mg | Freq: Four times a day (QID) | INTRAMUSCULAR | Status: DC | PRN

## 2022-01-22 MED ORDER — LEVOTHYROXINE SODIUM 100 MCG/5ML IV SOLN
50.0000 ug | Freq: Once | INTRAVENOUS | Status: AC
  Administered 2022-01-22: 50 ug via INTRAVENOUS
  Filled 2022-01-22: qty 5

## 2022-01-22 MED ORDER — FOSFOMYCIN TROMETHAMINE 3 G PO PACK
3.0000 g | PACK | Freq: Once | ORAL | Status: AC
Start: 2022-01-22 — End: 2022-01-22
  Administered 2022-01-22: 3 g via ORAL
  Filled 2022-01-22: qty 3

## 2022-01-22 MED ORDER — TRAZODONE HCL 50 MG PO TABS: 25.0000 mg | ORAL_TABLET | Freq: Every evening | ORAL | Status: DC | PRN

## 2022-01-22 MED ORDER — ACETAMINOPHEN 325 MG PO TABS: 650.0000 mg | ORAL_TABLET | Freq: Four times a day (QID) | ORAL | Status: DC | PRN

## 2022-01-22 MED ORDER — MULTI-VITAMIN/MINERALS PO TABS: 1.0000 | ORAL_TABLET | Freq: Every day | ORAL | 2 refills | Status: AC

## 2022-01-22 MED ORDER — ATROPINE SULFATE 1 MG/ML IV SOLN
1.0000 mg | Freq: Once | INTRAVENOUS | Status: DC
  Filled 2022-01-22: qty 1

## 2022-01-22 MED ORDER — MAGNESIUM HYDROXIDE 400 MG/5ML PO SUSP: 30.0000 mL | Freq: Every day | ORAL | Status: DC | PRN

## 2022-01-22 MED ORDER — SODIUM CHLORIDE 0.9 % IV SOLN: INTRAVENOUS | Status: DC

## 2022-01-22 MED ORDER — ONDANSETRON HCL 4 MG PO TABS: 4.0000 mg | ORAL_TABLET | Freq: Four times a day (QID) | ORAL | Status: DC | PRN

## 2022-01-22 NOTE — Assessment & Plan Note (Addendum)
-   We will continue  PPI and H2 blocker therapy. 

## 2022-01-22 NOTE — Discharge Instructions (Signed)
Home health PT has been arranged through [facility] agency- they should be able to come out and see you by Tuesday as they need to get authorization through your Medicaid.  The Contact for [facility] is Sharmon Revere- [phone]- if you should have any questions or do not hear from them by Monday call [facility].

## 2022-01-22 NOTE — Assessment & Plan Note (Signed)
-   This is clearly uncontrolled with significantly better TSH. - It could be related to noncompliance with her home Synthroid. - We will give her an IV dose of 50 mcg and resume her 100 mcg p.o. daily.

## 2022-01-22 NOTE — H&P (Signed)
[place]   PATIENT NAME: Helen Reid    MR#:  [id number]  DATE OF BIRTH:  12/31/1960  DATE OF ADMISSION:  01/21/2022  PRIMARY CARE PHYSICIAN: [name], PA-C   Patient is coming from: Home  REQUESTING/REFERRING PHYSICIAN: Ward, Delice Bison, DO  CHIEF COMPLAINT:   Chief Complaint  Patient presents with   Fall    HISTORY OF PRESENT ILLNESS:  Helen Reid is a 61 y.o. African-American female with medical history significant for anxiety, depression, IBS, PTSD, essential hypertension, lymphoma and vitamin D deficiency, presented to the ER with acute onset of left-sided chest pain felt as a sharp pain with pressure and radiation to the left shoulder with associated dyspnea and dizziness.  No nausea or vomiting or diaphoresis.  She admits to palpitations.  She has been having occasional cough and denies any wheezing.  No worsening lower extremity edema.  No dysuria, oliguria or hematuria or flank pain.  No bleeding diathesis.  She has been doing recent recurrent falls but denied any syncope and no injuries were reported.  No urinary frequency or urgency or dysuria or hematuria or flank pain.  She has been bradycardic in the ER her heart rate with drop in the 30s while asleep and in the 40s when she wakes up.  She has been in the ER since yesterday morning and was awaiting case management evaluation.  ED Course: When she came to the ER, heart rate has been ranging from 35-61 with otherwise normal vital signs.  Labs revealed creatinine 1.18 with otherwise unremarkable BMP.  CBC showed mild leukopenia.  UA showed 6-10 WBCs with moderate leukocytes and many bacteria.  COVID-19 PCR came back negative.  TSH came back significantly elevated at 73 and free T4 was 0.26 EKG as reviewed by me : EKG showed sinus bradycardia with a rate of 52 with Q waves anteroseptally.  Repeat EKG showed sinus bradycardia with a rate of 42 with probable right ventricular per trophy and flattened T waves as  well as T wave inversion laterally. Imaging: Portable chest ray showed no acute cardiopulmonary disease.  Noncontrast head CT scan revealed no acute intracranial normalities.  It showed cerebral atrophy.  The patient was given 100 mg of p.o. tramadol and 3 g p.o. fosfomycin for her UTI.  She will be admitted to an observation cardiac telemetry bed for further evaluation and management. PAST MEDICAL HISTORY:   Past Medical History:  Diagnosis Date   Anxiety    Cancer ([place])    lymphoma, gallbladder, breast   Depression    Hypertension    IBS (irritable bowel syndrome)    PTSD (post-traumatic stress disorder)    Vitamin D deficiency 09/27/2015    PAST SURGICAL HISTORY:   Past Surgical History:  Procedure Laterality Date   BREAST SURGERY     CHOLECYSTECTOMY      SOCIAL HISTORY:   Social History   Tobacco Use   Smoking status: Some Days    Packs/day: 0.50    Years: 25.00    Total pack years: 12.50    Types: Cigarettes    Start date: 1966   Smokeless tobacco: Never  Substance Use Topics   Alcohol use: Yes    Alcohol/week: 1.0 standard drink of alcohol    Types: 1 Glasses of wine per week    FAMILY HISTORY:   Family History  Problem Relation Age of Onset   Heart disease Mother    Hypertension Mother    Asthma Mother  Heart disease Father     DRUG ALLERGIES:   Allergies  Allergen Reactions   Peanut-Containing Drug Products Shortness Of Breath   Penicillins Anaphylaxis    Has patient had a PCN reaction causing immediate rash, facial/tongue/throat swelling, SOB or lightheadedness with hypotension: Yes Has patient had a PCN reaction causing severe rash involving mucus membranes or skin necrosis: No Has patient had a PCN reaction that required hospitalization Yes Has patient had a PCN reaction occurring within the last 10 years: No If all of the above answers are "NO", then may proceed with Cephalosporin use.   Aspirin Swelling   Dilantin  [Phenytoin] Nausea  And Vomiting   Macrolides And Ketolides Swelling   Phenobarbital Nausea And Vomiting    REVIEW OF SYSTEMS:   ROS As per history of present illness. All pertinent systems were reviewed above. Constitutional, HEENT, cardiovascular, respiratory, GI, GU, musculoskeletal, neuro, psychiatric, endocrine, integumentary and hematologic systems were reviewed and are otherwise negative/unremarkable except for positive findings mentioned above in the HPI.   MEDICATIONS AT HOME:   Prior to Admission medications   Medication Sig Start Date End Date Taking? Authorizing Provider  albuterol (VENTOLIN HFA) 108 (90 Base) MCG/ACT inhaler Inhale 2 puffs into the lungs every 6 (six) hours as needed for wheezing or shortness of breath. 09/16/21  Yes Ostwalt, Letitia Libra, PA-C  levothyroxine (SYNTHROID) 100 MCG tablet Take 1 tablet (100 mcg total) by mouth daily. 09/17/21  Yes Ostwalt, Letitia Libra, PA-C  QUEtiapine (SEROQUEL) 100 MG tablet Take 100 mg by mouth at bedtime.   Yes [provider]  citalopram (CELEXA) 10 MG tablet Take 10 mg by mouth daily. 10/28/16 10/28/17  [provider]  dicyclomine (BENTYL) 20 MG tablet Take 1 tablet (20 mg total) by mouth 3 (three) times daily as needed for spasms. Patient not taking: Reported on 09/15/2021 12/16/20 12/16/21  Lucrezia Starch, MD  EPINEPHrine (EPIPEN 2-PAK) 0.3 mg/0.3 mL IJ SOAJ injection Inject 0.3 mg into the muscle as needed for anaphylaxis. Patient not taking: Reported on 01/21/2022 08/18/21   Nena Polio, MD  famotidine (PEPCID) 20 MG tablet Take 1 tablet (20 mg total) by mouth 2 (two) times daily. 12/09/16 12/09/17  Darel Hong, MD  fluticasone-salmeterol (ADVAIR) 100-50 MCG/ACT AEPB Inhale 1 puff into the lungs 2 (two) times daily. Patient not taking: Reported on 01/21/2022 09/15/21   [name], PA-C  omeprazole (PRILOSEC) 40 MG capsule Take 1 capsule (40 mg total) by mouth daily. 12/09/16 12/09/17  Darel Hong, MD  ondansetron (ZOFRAN) 4 MG tablet Take  1 tablet (4 mg total) by mouth every 8 (eight) hours as needed for up to 10 doses for nausea or vomiting. Patient not taking: Reported on 01/21/2022 12/16/20   Lucrezia Starch, MD  prazosin (MINIPRESS) 5 MG capsule Take 15 mg by mouth daily. Patient not taking: Reported on 01/21/2022 10/28/16   [provider]  Vitamin D, Ergocalciferol, (DRISDOL) 50000 units CAPS capsule Take 1 capsule (50,000 Units total) by mouth every 30 (thirty) days. Patient not taking: Reported on 09/15/2021 09/17/15   Arnetha Courser, MD      VITAL SIGNS:  Blood pressure (!) 121/55, pulse (!) 44, temperature 98 F (36.7 C), temperature source Oral, resp. rate (!) 24, height '5\' 4"'$  (1.626 m), weight 73.4 kg, SpO2 97 %.  PHYSICAL EXAMINATION:  Physical Exam  GENERAL:  61 y.o.-year-old African-American female patient lying in the bed with no acute distress.  EYES: Pupils equal, round, reactive to light and accommodation.  No scleral icterus. Extraocular muscles intact.  HEENT: Head atraumatic, normocephalic. Oropharynx and nasopharynx clear.  NECK:  Supple, no jugular venous distention. No thyroid enlargement, no tenderness.  LUNGS: Normal breath sounds bilaterally, no wheezing, rales,rhonchi or crepitation. No use of accessory muscles of respiration.  CARDIOVASCULAR: Bradycardic with regular rate and rhythm, S1, S2 normal. No murmurs, rubs, or gallops.  ABDOMEN: Soft, nondistended, nontender. Bowel sounds present. No organomegaly or mass.  EXTREMITIES: No pedal edema, cyanosis, or clubbing.  NEUROLOGIC: Cranial nerves II through XII are intact. Muscle strength 5/5 in all extremities. Sensation intact. Gait not checked.  PSYCHIATRIC: The patient is alert and oriented x 3.  Normal affect and good eye contact. SKIN: No obvious rash, lesion, or ulcer.   LABORATORY PANEL:   CBC Recent Labs  Lab 01/22/22 0248  WBC 3.0*  HGB 12.3  HCT 39.6  PLT 192    ------------------------------------------------------------------------------------------------------------------  Chemistries  Recent Labs  Lab 01/22/22 0248  NA 138  K 4.0  CL 105  CO2 28  GLUCOSE 88  BUN 19  CREATININE 1.18*  CALCIUM 9.4  MG 2.4   ------------------------------------------------------------------------------------------------------------------  Cardiac Enzymes No results for input(s): "TROPONINI" in the last 168 hours. ------------------------------------------------------------------------------------------------------------------  RADIOLOGY:  DG Chest Portable 1 View  Result Date: 01/22/2022 CLINICAL DATA:  Bradycardia EXAM: PORTABLE CHEST 1 VIEW COMPARISON:  01/21/2022 FINDINGS: Cardiac and mediastinal contours are within normal limits given AP technique. No focal pulmonary opacity. No pleural effusion or pneumothorax. No acute osseous abnormality. IMPRESSION: No acute cardiopulmonary process. Electronically Signed   By: Merilyn Baba M.D.   On: 01/22/2022 02:36   CT HEAD WO CONTRAST (5MM)  Result Date: 01/21/2022 CLINICAL DATA:  Trauma, multiple falls CT HEAD WITHOUT CONTRAST TECHNIQUE: Contiguous axial images were obtained from the base of the skull through the vertex without intravenous contrast. RADIATION DOSE REDUCTION: This exam was performed according to the departmental dose-optimization program which includes automated exposure control, adjustment of the mA and/or kV according to patient size and/or use of iterative reconstruction technique. COMPARISON:  06/19/2007 FINDINGS: Brain: No acute intracranial findings are seen. There are no signs of bleeding within the cranium. Ventricles are nondilated. Cortical sulci are prominent. Vascular: Unremarkable. Skull: Unremarkable. Sinuses/Orbits: Paranasal sinuses are unremarkable. There is leftward gaze in both optic globes. There is prominence of lacrimal glands. Other: None. IMPRESSION: No acute intracranial  findings are seen.  Atrophy. Electronically Signed   By: Elmer Picker M.D.   On: 01/21/2022 11:35   DG Hip Unilat W or Wo Pelvis 2-3 Views Left  Result Date: 01/21/2022 CLINICAL DATA:  FALL, LEFT HIP PAIN EXAM: DG HIP (WITH OR WITHOUT PELVIS) 2-3V LEFT COMPARISON:  12/16/2020 FINDINGS: There is no evidence of hip fracture or dislocation. There is no evidence of arthropathy or other focal bone abnormality. IMPRESSION: Negative. Electronically Signed   By: Davina Poke D.O.   On: 01/21/2022 09:40   DG Ribs Unilateral W/Chest Left  Result Date: 01/21/2022 CLINICAL DATA:  Fall, left rib pain EXAM: LEFT RIBS AND CHEST - 3+ VIEW COMPARISON:  12/16/2020 FINDINGS: No left rib fracture or other bone lesions are seen. There is no evidence of pneumothorax or pleural effusion. Both lungs are clear. Heart size and mediastinal contours are within normal limits. IMPRESSION: Negative. Electronically Signed   By: Davina Poke D.O.   On: 01/21/2022 09:35      IMPRESSION AND PLAN:  Assessment and Plan: * Symptomatic bradycardia - This could be contributing to her chest pain and  dyspnea as well as dizziness. - It is highly likely that this is related to uncontrolled hypothyroidism with significantly elevated TSH and low free T4. - She will be admitted to observation telemetry bed. - We will closely monitor her and use as needed IV atropine if heart rate is persistently below 40. -I will place on IV Synthroid for 1 dose then resume her home Synthroid. - Cardiology consult to be obtained.  She has had bradycardia from 2017 and was evaluated by [facility]. - I notified Dr. Rockey Situ about patient.  Adult hypothyroidism - This is clearly uncontrolled with significantly better TSH. - It could be related to noncompliance with her home Synthroid. - We will give her an IV dose of 50 mcg and resume her 100 mcg p.o. daily.  Acute lower UTI - She will given fosfomycin.  GERD without esophagitis - We will  continue PPI and H2 blocker therapy.  Major depressive disorder, recurrent episode, moderate (HCC) - We will continue Celexa and Seroquel.    DVT prophylaxis: Lovenox.  Advanced Care Planning:  Code Status: full code.  Family Communication:  The plan of care was discussed in details with the patient (and family). I answered all questions. The patient agreed to proceed with the above mentioned plan. Further management will depend upon hospital course. Disposition Plan: Back to previous home environment Consults called: Cardiology. All the records are reviewed and case discussed with ED provider.  Status is: Observation   I certify that at the time of admission, it is my clinical judgment that the patient will require  hospital care extending less than 2 midnights.                            Dispo: The patient is from: Home              Anticipated d/c is to: Home              Patient currently is not medically stable to d/c.              Difficult to place patient: No  Christel Mormon M.D on 01/22/2022 at 5:57 AM  [facility]   From 7 PM-7 AM, contact night-coverage [email or web address]  CC: Primary care physician; [name], PA-C

## 2022-01-22 NOTE — ED Provider Notes (Addendum)
2:11 AM  Pt is a 61 year old female with history of substance abuse, hypothyroidism, medical noncompliance who is here waiting for social work evaluation and physical therapy evaluation for frequent falls.  On my evaluation patient's heart rate is as low as 37 on the monitor with normal blood pressures.  It looks like she has been in the 50s before but never in the 4s.  She tells me she has had intermittent chest pain, shortness of breath and dizziness.  Initial blood work reassuring but will add on troponin, magnesium and obtain a chest x-ray.  Will repeat EKG and place her on cardiac monitoring with pacer pads if needed.  She does not appear to be on any beta-blockers or rate controlling medication.  Her initial EKG showed sinus bradycardia without interval abnormality, blocks.   2:23 AM  Pt's repeat EKG shows heart rate of 42 without ischemic changes, interval abnormality is or AV blocks.  She is sitting upright and awake when we have obtained this EKG.  She is on cardiac monitoring and being monitored on the [place] as well.  Will discuss with hospitalist for admission.  2:27 AM  Consulted and discussed patient's case with hospitalist, Dr. Sidney Ace.  I have recommended admission and consulting physician agrees and will place admission orders.  Patient (and family if present) agree with this plan.   I reviewed all nursing notes, vitals, pertinent previous records.  All labs, EKGs, imaging ordered have been independently reviewed and interpreted by myself.   2:46 AM  Pt's add on troponin and magnesium are normal.  Repeat chest x-ray reviewed and interpreted by myself and the radiologist and shows no acute abnormality.  Patient continues to be bradycardic in the 40s but otherwise hemodynamically stable.  We are closely monitoring.   3:10 AM  Pt's rate now in the mid 30s.  I have seen and go as low as 35 and it is staying at that point.  Blood pressure is still stable and she is awake and mentating normally.   We will give a 3.5 mg of atropine.  We will update the hospitalist.  Patient okay with admission to the hospital and medications but states now that she is adamant that she does not want a pacemaker if needed.  Labs show a new leukopenia.  Normal hemoglobin and platelets.  We will check COVID swab.  Urine appears possibly infected with moderate leukocytes, 6-10 white blood cells and many bacteria with no squamous cells.  We will add on a urine culture and give 1 time dose of fosfomycin here.  3:26 AM  Pt's HR now in 50-60s.  Repeat EKG shows no significant change.  Repeat electrolytes normal.  Repeat troponin negative.  5:16 AM  Pt's TSH is elevated in the 70s and free T4 is 0.2.  Updated the hospitalist who has reordered her levothyroxine.  It appears she had similar labs in September 2016 in the ED but I cannot see what her heart rate was at that time.  Updated the hospitalist who has ordered thyroid replacement medication.  This could be potentially causing her bradycardia.  CRITICAL CARE Performed by: Pryor Curia   Total critical care time: 45 minutes  Critical care time was exclusive of separately billable procedures and treating other patients.  Critical care was necessary to treat or prevent imminent or life-threatening deterioration.  Critical care was time spent personally by me on the following activities: development of treatment plan with patient and/or surrogate as well as nursing, discussions with  consultants, evaluation of patient's response to treatment, examination of patient, obtaining history from patient or surrogate, ordering and performing treatments and interventions, ordering and review of laboratory studies, ordering and review of radiographic studies, pulse oximetry and re-evaluation of patient's condition.       Date: 01/22/2022 2:22 AM  Rate: 42  Rhythm: Sinus bradycardia  QRS Axis: normal  Intervals: normal  ST/T Wave abnormalities: normal  Conduction  Disutrbances: none  Narrative Interpretation: Sinus bradycardia    Date: 01/22/2022 3:25 AM  Rate: 51  Rhythm: Sinus bradycardia  QRS Axis: normal  Intervals: normal  ST/T Wave abnormalities: normal  Conduction Disutrbances: none  Narrative Interpretation: Improved sinus bradycardia           Margurete Guaman, Delice Bison, DO 01/22/22 0328    Hulbert Branscome, Delice Bison, DO 01/22/22 0530

## 2022-01-22 NOTE — Assessment & Plan Note (Signed)
-   We will continue Celexa and Seroquel.

## 2022-01-22 NOTE — Hospital Course (Addendum)
Taken from H&P.  Helen Reid is a 61 y.o. African-American female with medical history significant for anxiety, depression, IBS, PTSD, essential hypertension, lymphoma and vitamin D deficiency, presented to the ER with acute onset of left-sided chest pain felt as a sharp pain with pressure and radiation to the left shoulder with associated dyspnea and dizziness.  No nausea or vomiting or diaphoresis.  She admits to palpitations.  She has been having occasional cough and denies any wheezing.  No worsening lower extremity edema.  No dysuria, oliguria or hematuria or flank pain.  No bleeding diathesis.  She has been doing recent recurrent falls but denied any syncope and no injuries were reported.  No urinary frequency or urgency or dysuria or hematuria or flank pain.  She has been bradycardic in the ER her heart rate with drop in the 30s while asleep and in the 40s when she wakes up.  ED Course: When she came to the ER, heart rate has been ranging from 35-61 with otherwise normal vital signs.  Labs revealed creatinine 1.18 with otherwise unremarkable BMP.  CBC showed mild leukopenia.  UA showed 6-10 WBCs with moderate leukocytes and many bacteria.  COVID-19 PCR came back negative.  TSH came back significantly elevated at 73 and free T4 was 0.26 EKG showed sinus bradycardia with a rate of 52 with Q waves anteroseptally.  Repeat EKG showed sinus bradycardia with a rate of 42 with probable right ventricular per trophy and flattened T waves as well as T wave inversion laterally. Imaging: Portable chest ray showed no acute cardiopulmonary disease.  Noncontrast head CT scan revealed no acute intracranial normalities.  It showed cerebral atrophy.   The patient was given 100 mg of p.o. tramadol and 3 g p.o. fosfomycin for her UTI.   Patient was evaluated by cardiology and found to have sinus bradycardia most likely secondary to hypothyroidism.  Patient was not taking her thyroid medications for many months.  She  was given 1 dose of IV Synthroid and was started on home dose of 100 mcg. Patient need to have a close follow-up with primary care provider and have her level checked every few weeks until stabilizes.  Cardiology also recommended outpatient ZIO monitor. Patient will follow-up with cardiology for further recommendations. Echocardiogram was without any significant abnormality.  Our physical therapist evaluated her and recommending home health PT which was ordered.  Patient will continue on current medications and need to have a close follow-up with her providers for further recommendations.

## 2022-01-22 NOTE — Assessment & Plan Note (Addendum)
-   This could be contributing to her chest pain and dyspnea as well as dizziness. - It is highly likely that this is related to uncontrolled hypothyroidism with significantly elevated TSH and low free T4. - She will be admitted to observation telemetry bed. - We will closely monitor her and use as needed IV atropine if heart rate is persistently below 40. -I will place on IV Synthroid for 1 dose then resume her home Synthroid. - Cardiology consult to be obtained.  She has had bradycardia from 2017 and was evaluated by [facility]. - I notified Dr. Rockey Situ about patient.

## 2022-01-22 NOTE — TOC Transition Note (Signed)
Transition of Care [facility]) - CM/SW Discharge Note   Patient Details  Name: Helen Reid MRN: [id number] Date of Birth: Mar 25, 1961  [facility] CM/SW Contact:  Shelbie Hutching, RN Phone Number: 01/22/2022, 4:13 PM   Clinical Narrative:    Patient medically cleared for discharge home.  PT has worked with patient and recommended home health services.  RNCM has arranged [place] [facility] through [place], Sharmon Revere accepted the referral.  RW has been ordered it will be delivered to the home as the patient has already discharged.  RNCM was able to call and speak with patient at home to update on home health and equipment ordered.    Final next level of care: [place] Barriers to Discharge: Barriers Resolved   Patient Goals and CMS Choice Patient states their goals for this hospitalization and ongoing recovery are:: patient agrees to home with home health CMS Medicare.gov Compare Post Acute Care list provided to:: Patient Choice offered to / list presented to : Patient  Discharge Placement                       Discharge Plan and Services   Discharge Planning Services: CM Consult Post Acute Care Choice: [place]          DME Arranged: Gilford Rile rolling DME Agency: [employer] Date DME Agency Contacted: 01/22/22 Time DME Agency Contacted: 43 Representative spoke with at DME Agency: [place]: PT [place]: [place] Date [place]: 01/22/22 Time [place]: 13 Representative spoke with at [place]: [place]) Interventions     Readmission Risk Interventions     No data to display

## 2022-01-22 NOTE — Assessment & Plan Note (Signed)
-   She will given fosfomycin.

## 2022-01-22 NOTE — ED Notes (Signed)
Bedside report given to RN Sarah in Pod C.  All questions answered. 

## 2022-01-22 NOTE — Progress Notes (Signed)
*  PRELIMINARY RESULTS* Echocardiogram 2D Echocardiogram has been performed.  Helen Reid 01/22/2022, 11:52 AM
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
## 2022-01-22 NOTE — Consult Note (Signed)
Cardiology Consultation:   Patient ID: Helen Reid; [id number]; 1961/01/26   Admit date: 01/21/2022 Date of Consult: 01/22/2022  Primary Care Provider: [name], PA-C Primary Cardiologist: new - consult by Fletcher Anon Primary Electrophysiologist:  None   Patient Profile:   Helen Reid is a 61 y.o. female with a hx of uncontrolled hypothyroidism, medical nonadherence, reported lymphoma, breast cancer, and colorectal cancer without treatment per her report, HTN, untreated sleep apnea, history of substance abuse, chronic chest pain "all my life", and vertigo who is being seen today for the evaluation of bradycardia in the setting of uncontrolled hypothyroidism at the request of Dr. Sidney Ace.  History of Present Illness:   Ms. Kalama was previously evaluated in our office in 2017 for bradycardia with 24-hour Holter at that time showing a predominant rhythm of sinus with an average rate of 62 bpm with a range of 42 to 114 bpm with the longest R to R interval 1.6 seconds along with rare PACs and PVCs.  Lexiscan MPI at that time showed no evidence of ischemia and was overall low risk with preserved LV systolic function.  Echo showed an EF of 60 to 65%, no regional wall motion abnormalities, moderate aortic insufficiency, mild mitral regurgitation, moderate tricuspid regurgitation, and a trivial pericardial effusion.  She reports that she subsequently moved to [place] and was diagnosed with colorectal cancer, though reports no surgical intervention or chemoradiation.  Further details of this are unclear.  She reports she does not take medications other than her "herbs."    More recently, she has had multiple falls and describes this as a sensation of "bone-on-bone" in her hips and knees.  She indicates she will try to walk and her leg will "lock up" causing her to fall.  She has been without frank syncope.  In this setting, she has had at least 6-7 falls over the past month.  She reports a history of  atypical chest pain that has been "all my life."  Pain is not worsened by exertion.  No associated symptoms.  She was admitted to [facility] early this morning in the context of recurrent falls with chronic chest pain.  She was noted to be bradycardic with heart rates ranging from the 30s to 60s bpm.  BP stable.  High-sensitivity troponin negative x3.  EKG showed sinus bradycardia without evidence of high-grade AV block or prolonged pauses.  TSH elevated at 73.102 with a free T4 low at 0.26.  Urine drug screen positive for cannabinoid.  Potassium 4.0.  Magnesium 2.3.  Chest x-ray without acute process x2.  No rib fracture.  No hip fracture.  CT head nonacute.  Not on any AV nodal blocking medications.  Currently asymptomatic, laying in bed watching a movie on her phone.   Past Medical History:  Diagnosis Date   Anxiety    Cancer ([place])    lymphoma, gallbladder, breast   Depression    Hypertension    IBS (irritable bowel syndrome)    PTSD (post-traumatic stress disorder)    Vitamin D deficiency 09/27/2015    Past Surgical History:  Procedure Laterality Date   BREAST SURGERY     CHOLECYSTECTOMY       Home Meds: Prior to Admission medications   Medication Sig Start Date End Date Taking? Authorizing Provider  albuterol (VENTOLIN HFA) 108 (90 Base) MCG/ACT inhaler Inhale 2 puffs into the lungs every 6 (six) hours as needed for wheezing or shortness of breath. 09/16/21  Yes Ostwalt, Letitia Libra, PA-C  levothyroxine (SYNTHROID)  100 MCG tablet Take 1 tablet (100 mcg total) by mouth daily. 09/17/21  Yes Ostwalt, Letitia Libra, PA-C  QUEtiapine (SEROQUEL) 100 MG tablet Take 100 mg by mouth at bedtime.   Yes [provider]  citalopram (CELEXA) 10 MG tablet Take 10 mg by mouth daily. 10/28/16 10/28/17  [provider]  dicyclomine (BENTYL) 20 MG tablet Take 1 tablet (20 mg total) by mouth 3 (three) times daily as needed for spasms. Patient not taking: Reported on 09/15/2021 12/16/20 12/16/21  Lucrezia Starch,  MD  EPINEPHrine (EPIPEN 2-PAK) 0.3 mg/0.3 mL IJ SOAJ injection Inject 0.3 mg into the muscle as needed for anaphylaxis. Patient not taking: Reported on 01/21/2022 08/18/21   Nena Polio, MD  famotidine (PEPCID) 20 MG tablet Take 1 tablet (20 mg total) by mouth 2 (two) times daily. 12/09/16 12/09/17  Darel Hong, MD  fluticasone-salmeterol (ADVAIR) 100-50 MCG/ACT AEPB Inhale 1 puff into the lungs 2 (two) times daily. Patient not taking: Reported on 01/21/2022 09/15/21   [name], PA-C  omeprazole (PRILOSEC) 40 MG capsule Take 1 capsule (40 mg total) by mouth daily. 12/09/16 12/09/17  Darel Hong, MD  ondansetron (ZOFRAN) 4 MG tablet Take 1 tablet (4 mg total) by mouth every 8 (eight) hours as needed for up to 10 doses for nausea or vomiting. Patient not taking: Reported on 01/21/2022 12/16/20   Lucrezia Starch, MD  prazosin (MINIPRESS) 5 MG capsule Take 15 mg by mouth daily. Patient not taking: Reported on 01/21/2022 10/28/16   [provider]  Vitamin D, Ergocalciferol, (DRISDOL) 50000 units CAPS capsule Take 1 capsule (50,000 Units total) by mouth every 30 (thirty) days. Patient not taking: Reported on 09/15/2021 09/17/15   Arnetha Courser, MD    Inpatient Medications: Scheduled Meds:  citalopram  10 mg Oral Daily   enoxaparin (LOVENOX) injection  40 mg Subcutaneous Q24H   famotidine  20 mg Oral BID   levothyroxine  100 mcg Oral Daily   levothyroxine  25 mcg Oral Q0600   pantoprazole  40 mg Oral Daily   QUEtiapine  100 mg Oral QHS   Continuous Infusions:  sodium chloride 100 mL/hr at 01/22/22 0330   PRN Meds: acetaminophen **OR** acetaminophen, albuterol, atropine, magnesium hydroxide, ondansetron **OR** ondansetron (ZOFRAN) IV, traZODone  Allergies:   Allergies  Allergen Reactions   Peanut-Containing Drug Products Shortness Of Breath   Penicillins Anaphylaxis    Has patient had a PCN reaction causing immediate rash, facial/tongue/throat swelling, SOB or lightheadedness with  hypotension: Yes Has patient had a PCN reaction causing severe rash involving mucus membranes or skin necrosis: No Has patient had a PCN reaction that required hospitalization Yes Has patient had a PCN reaction occurring within the last 10 years: No If all of the above answers are "NO", then may proceed with Cephalosporin use.   Aspirin Swelling   Dilantin  [Phenytoin] Nausea And Vomiting   Macrolides And Ketolides Swelling   Phenobarbital Nausea And Vomiting    Social History:   Social History   Socioeconomic History   Marital status: Single    Spouse name: Not on file   Number of children: 4   Years of education: Not on file   Highest education level: Not on file  Occupational History   Not on file  Tobacco Use   Smoking status: Some Days    Packs/day: 0.50    Years: 25.00    Total pack years: 12.50    Types: Cigarettes    Start date:  1966   Smokeless tobacco: Never  Substance and Sexual Activity   Alcohol use: Yes    Alcohol/week: 1.0 standard drink of alcohol    Types: 1 Glasses of wine per week   Drug use: Yes    Types: Marijuana, "Crack" cocaine   Sexual activity: Yes    Partners: Male  Other Topics Concern   Not on file  Social History Narrative   Not on file   Social Determinants of Health   Financial Resource Strain: Not on file  Food Insecurity: Not on file  Transportation Needs: Not on file  Physical Activity: Not on file  Stress: Not on file  Social Connections: Not on file  Intimate Partner Violence: Not on file     Family History:   Family History  Problem Relation Age of Onset   Heart disease Mother    Hypertension Mother    Asthma Mother    Heart disease Father     ROS:  Review of Systems  Constitutional:  Positive for malaise/fatigue. Negative for chills, diaphoresis, fever and weight loss.  HENT:  Negative for congestion.   Eyes:  Negative for discharge and redness.  Respiratory:  Negative for cough, sputum production, shortness of  breath and wheezing.   Cardiovascular:  Negative for chest pain, palpitations, orthopnea, claudication, leg swelling and PND.  Gastrointestinal:  Negative for abdominal pain, blood in stool, heartburn, melena, nausea and vomiting.  Musculoskeletal:  Positive for falls and joint pain. Negative for myalgias.  Skin:  Negative for rash.  Neurological:  Positive for weakness. Negative for dizziness, tingling, tremors, sensory change, speech change, focal weakness and loss of consciousness.  Endo/Heme/Allergies:  Does not bruise/bleed easily.  Psychiatric/Behavioral:  Negative for substance abuse. The patient is not nervous/anxious.   All other systems reviewed and are negative.     Physical Exam/Data:   Vitals:   01/22/22 0315 01/22/22 0330 01/22/22 0400 01/22/22 0430  BP: (!) 134/57 124/63 (!) 104/57 (!) 121/55  Pulse: 61 (!) 53 (!) 47 (!) 44  Resp: 16 19 (!) 29 (!) 24  Temp:  98 F (36.7 C)    TempSrc:  Oral    SpO2: 99% 98% 97% 97%  Weight:      Height:       No intake or output data in the 24 hours ending 01/22/22 0816 Filed Weights   01/21/22 0842  Weight: 73.4 kg   Body mass index is 27.78 kg/m.   Physical Exam: General: Well developed, well nourished, in no acute distress. Head: Normocephalic, atraumatic, sclera non-icteric, no xanthomas, nares without discharge.  Neck: Negative for carotid bruits. JVD not elevated. Lungs: Clear bilaterally to auscultation without wheezes, rales, or rhonchi. Breathing is unlabored. Heart: Bradycardic and regular with S1 S2. I/VI systolic murmur LSB, no rubs, or gallops appreciated. Abdomen: Soft, non-tender, non-distended with normoactive bowel sounds. No hepatomegaly. No rebound/guarding. No obvious abdominal masses. Msk:  Strength and tone appear normal for age. Extremities: No clubbing or cyanosis. No edema. Distal pedal pulses are 2+ and equal bilaterally. Neuro: Alert and oriented X 3. No facial asymmetry. No focal deficit. Moves all  extremities spontaneously. Psych:  Responds to questions appropriately with a normal affect.   EKG:  The EKG was personally reviewed and demonstrates: sinus bradycardia, 52 bpm, baseline wandering no acute st/t changes. Repeat EKG this morning with sinus bradycardia, 42 bpm, and lateral TWI Telemetry:  Telemetry was personally reviewed and demonstrates: sinus bradycardia with rates from the upper 30s to 50s  bpm, no prolonged pauses or evidence of high grade AV block  Weights: Filed Weights   01/21/22 0842  Weight: 73.4 kg    Relevant CV Studies:  24-hour Holter 11/2015: overall rhythm is sinus rhythm. Heart rate ranged from 42-114 bpm, average of 62 BPM. Longest RR interval was normal at 1.6 seconds.   No high grade supraventricular ectopy: 62 isolated PACs, 2 atrial couplets.   No high-grade ventricular ectopy: 179 isolated PVCs. __________  Carlton Adam MPI 10/17/2015: There was no ST segment deviation noted during stress. Defect 1: There is a small defect of mild severity present in the apex location. This is likely due to breast attenuation. The study is normal. This is a low risk study. The left ventricular ejection fraction is normal (55-65%). __________  2D echo 10/15/2015: - Left ventricle: The cavity size was normal. Wall thickness was    normal. Systolic function was normal. The estimated ejection    fraction was in the range of 60% to 65%. Wall motion was normal;    there were no regional wall motion abnormalities. Doppler    parameters are consistent with high ventricular filling pressure.  - Aortic valve: There was moderate regurgitation.  - Mitral valve: There was mild regurgitation.  - Tricuspid valve: There was moderate regurgitation.  - Pericardium, extracardiac: A trivial pericardial effusion was    identified.  Laboratory Data:  Chemistry Recent Labs  Lab 01/21/22 0845 01/22/22 0248  NA 140 138  K 4.0 4.0  CL 106 105  CO2 28 28  GLUCOSE 75 88  BUN 13  19  CREATININE 1.10* 1.18*  CALCIUM 9.4 9.4  GFRNONAA 56* 52*  ANIONGAP 6 5    No results for input(s): "PROT", "ALBUMIN", "AST", "ALT", "ALKPHOS", "BILITOT" in the last 168 hours. Hematology Recent Labs  Lab 01/21/22 0845 01/22/22 0248 01/22/22 0540  WBC 2.9* 3.0* 2.7*  RBC 5.41* 5.32* 4.78  HGB 12.5 12.3 11.3*  HCT 39.8 39.6 35.8*  MCV 73.6* 74.4* 74.9*  MCH 23.1* 23.1* 23.6*  MCHC 31.4 31.1 31.6  RDW 14.6 14.7 16.4*  PLT 207 192 134*   Cardiac EnzymesNo results for input(s): "TROPONINI" in the last 168 hours. No results for input(s): "TROPIPOC" in the last 168 hours.  BNPNo results for input(s): "BNP", "PROBNP" in the last 168 hours.  DDimer No results for input(s): "DDIMER" in the last 168 hours.  Radiology/Studies:  DG Chest Portable 1 View  Result Date: 01/22/2022 IMPRESSION: No acute cardiopulmonary process. Electronically Signed   By: Merilyn Baba M.D.   On: 01/22/2022 02:36   CT HEAD WO CONTRAST (5MM)  Result Date: 01/21/2022 IMPRESSION: No acute intracranial findings are seen.  Atrophy. Electronically Signed   By: Elmer Picker M.D.   On: 01/21/2022 11:35   DG Hip Unilat W or Wo Pelvis 2-3 Views Left  Result Date: 01/21/2022 IMPRESSION: Negative. Electronically Signed   By: Davina Poke D.O.   On: 01/21/2022 09:40   DG Ribs Unilateral W/Chest Left  Result Date: 01/21/2022 IMPRESSION: Negative. Electronically Signed   By: Davina Poke D.O.   On: 01/21/2022 09:35    Assessment and Plan:   1. Bradycardia: -Likely in the setting of profound hypothyroidism  -Ambulate to assess chronotropic competence  -Avoid AV nodal blocking medications (not on any PTA) -Hemodynamically stable -No indication for emergent PPM or temp wire at this time -Place pacer pads at bedside -PRN atropine -If becomes hemodynamically unstable, could initiate dopamine infusion -Monitor on telemetry -Plan for outpatient  cardiac monitoring upon discharge -Obtain echo to  evaluate for any structural abnormalities -Patient also with a history of untreated sleep apnea which is contributing to bradycardic rates into the 30s while asleep overnight  2.  Uncontrolled hypothyroidism: -Now on replacement therapy -Management per IM  3.  Reported lymphoma, breast cancer, and colorectal cancer: -She reports no prior treatment for her colorectal cancer -Further details are unavailable -Management per primary service  4.  Frequent falls: -She describes this as a sensation of "bone on bone" in her hips and knees and a sensation that her leg will not move -No frank syncope -Management per primary service  5.  History of chest pain: -Atypical -Reports her chest pain has been "all my life" -Normal high-sensitivity troponin -Update echo, if this demonstrates preserved LV systolic function and normal wall motion, would recommend outpatient follow-up for noninvasive ischemic testing for further risk stratification  6.  Untreated sleep apnea: -Contributing to nocturnal bradycardia -Needs follow-up as outpatient  7.  Medical nonadherence: -Compliance recommended  8.  History of vertigo: -Possibly contributing to presentation      For questions or updates, please contact [facility] Please consult [email or web address] for contact info under Cardiology/STEMI.   Signed, Christell Faith, PA-C [facility] Pager: [phone] 01/22/2022, 8:16 AM

## 2022-01-22 NOTE — Discharge Summary (Signed)
Physician Discharge Summary   Patient: Helen Reid MRN: [id number] DOB: 11-08-1960  Admit date:     01/21/2022  Discharge date: 01/22/22  Discharge Physician: Lorella Nimrod   PCP: [name], PA-C   Recommendations at discharge:  Please obtain TSH in 2 to 3 weeks. Please ensure that patient is taking her Synthroid. Follow-up with primary care provider within a week Follow-up with cardiology  Discharge Diagnoses: Principal Problem:   Symptomatic bradycardia Active Problems:   Adult hypothyroidism   Acute lower UTI   Major depressive disorder, recurrent episode, moderate (HCC)   GERD without esophagitis   Hospital Course: Taken from H&P.  Helen Reid is a 61 y.o. African-American female with medical history significant for anxiety, depression, IBS, PTSD, essential hypertension, lymphoma and vitamin D deficiency, presented to the ER with acute onset of left-sided chest pain felt as a sharp pain with pressure and radiation to the left shoulder with associated dyspnea and dizziness.  No nausea or vomiting or diaphoresis.  She admits to palpitations.  She has been having occasional cough and denies any wheezing.  No worsening lower extremity edema.  No dysuria, oliguria or hematuria or flank pain.  No bleeding diathesis.  She has been doing recent recurrent falls but denied any syncope and no injuries were reported.  No urinary frequency or urgency or dysuria or hematuria or flank pain.  She has been bradycardic in the ER her heart rate with drop in the 30s while asleep and in the 40s when she wakes up.  ED Course: When she came to the ER, heart rate has been ranging from 35-61 with otherwise normal vital signs.  Labs revealed creatinine 1.18 with otherwise unremarkable BMP.  CBC showed mild leukopenia.  UA showed 6-10 WBCs with moderate leukocytes and many bacteria.  COVID-19 PCR came back negative.  TSH came back significantly elevated at 73 and free T4 was 0.26 EKG showed sinus  bradycardia with a rate of 52 with Q waves anteroseptally.  Repeat EKG showed sinus bradycardia with a rate of 42 with probable right ventricular per trophy and flattened T waves as well as T wave inversion laterally. Imaging: Portable chest ray showed no acute cardiopulmonary disease.  Noncontrast head CT scan revealed no acute intracranial normalities.  It showed cerebral atrophy.   The patient was given 100 mg of p.o. tramadol and 3 g p.o. fosfomycin for her UTI.   Patient was evaluated by cardiology and found to have sinus bradycardia most likely secondary to hypothyroidism.  Patient was not taking her thyroid medications for many months.  She was given 1 dose of IV Synthroid and was started on home dose of 100 mcg. Patient need to have a close follow-up with primary care provider and have her level checked every few weeks until stabilizes.  Cardiology also recommended outpatient ZIO monitor. Patient will follow-up with cardiology for further recommendations. Echocardiogram was without any significant abnormality.  Our physical therapist evaluated her and recommending home health PT which was ordered.  Patient will continue on current medications and need to have a close follow-up with her providers for further recommendations.  Assessment and Plan: * Symptomatic bradycardia - This could be contributing to her chest pain and dyspnea as well as dizziness. - It is highly likely that this is related to uncontrolled hypothyroidism with significantly elevated TSH and low free T4. - She will be admitted to observation telemetry bed. - We will closely monitor her and use as needed IV atropine if heart rate  is persistently below 40. -I will place on IV Synthroid for 1 dose then resume her home Synthroid. - Cardiology consult to be obtained.  She has had bradycardia from 2017 and was evaluated by [facility]. - I notified Dr. Rockey Situ about patient.  Adult hypothyroidism - This is clearly uncontrolled  with significantly better TSH. - It could be related to noncompliance with her home Synthroid. - We will give her an IV dose of 50 mcg and resume her 100 mcg p.o. daily.  Acute lower UTI - She will given fosfomycin.  GERD without esophagitis - We will continue PPI and H2 blocker therapy.  Major depressive disorder, recurrent episode, moderate (HCC) - We will continue Celexa and Seroquel.   Consultants: Cardiology Procedures performed: None Disposition: Home Diet recommendation:  Discharge Diet Orders (From admission, onward)     Start     Ordered   01/22/22 0000  Diet - low sodium heart healthy        01/22/22 1339           Regular diet DISCHARGE MEDICATION: Allergies as of 01/22/2022       Reactions   Peanut-containing Drug Products Shortness Of Breath   Penicillins Anaphylaxis   Has patient had a PCN reaction causing immediate rash, facial/tongue/throat swelling, SOB or lightheadedness with hypotension: Yes Has patient had a PCN reaction causing severe rash involving mucus membranes or skin necrosis: No Has patient had a PCN reaction that required hospitalization Yes Has patient had a PCN reaction occurring within the last 10 years: No If all of the above answers are "NO", then may proceed with Cephalosporin use.   Aspirin Swelling   Dilantin  [phenytoin] Nausea And Vomiting   Macrolides And Ketolides Swelling   Phenobarbital Nausea And Vomiting        Medication List     STOP taking these medications    dicyclomine 20 MG tablet Commonly known as: Bentyl   famotidine 20 MG tablet Commonly known as: PEPCID   omeprazole 40 MG capsule Commonly known as: PriLOSEC   ondansetron 4 MG tablet Commonly known as: Zofran   prazosin 5 MG capsule Commonly known as: MINIPRESS   Vitamin D (Ergocalciferol) 1.25 MG (50000 UNIT) Caps capsule Commonly known as: DRISDOL       TAKE these medications    albuterol 108 (90 Base) MCG/ACT inhaler Commonly known  as: VENTOLIN HFA Inhale 2 puffs into the lungs every 6 (six) hours as needed for wheezing or shortness of breath.   citalopram 10 MG tablet Commonly known as: CELEXA Take 10 mg by mouth daily.   EPINEPHrine 0.3 mg/0.3 mL Soaj injection Commonly known as: EpiPen 2-Pak Inject 0.3 mg into the muscle as needed for anaphylaxis.   fluticasone-salmeterol 100-50 MCG/ACT Aepb Commonly known as: ADVAIR Inhale 1 puff into the lungs 2 (two) times daily.   levothyroxine 100 MCG tablet Commonly known as: SYNTHROID Take 1 tablet (100 mcg total) by mouth daily.   multivitamin with minerals tablet Take 1 tablet by mouth daily.   QUEtiapine 100 MG tablet Commonly known as: SEROQUEL Take 100 mg by mouth at bedtime.               Durable Medical Equipment  (From admission, onward)           Start     Ordered   01/22/22 1333  For home use only DME Walker rolling  Once       Question Answer Comment  Walker: With 5 Inch  Wheels   Patient needs a walker to treat with the following condition Generalized weakness      01/22/22 1332   01/22/22 1050  For home use only DME Walker rolling  Once       Question Answer Comment  Walker: With [place]   Patient needs a walker to treat with the following condition General weakness      01/22/22 1049            Follow-up Information     Ostwalt, Janna, PA-C. Schedule an appointment as soon as possible for a visit in 1 week(s).   Specialty: Physician Assistant Contact information: [address] [place] [address] [phone]         Minna Merritts, MD. Schedule an appointment as soon as possible for a visit in 1 week(s).   Specialty: Cardiology Contact information: [place] [address] [phone]                Discharge Exam: Danley Danker Weights   01/21/22 0842  Weight: 73.4 kg   General.     In no acute distress. Pulmonary.  Lungs clear bilaterally, normal  respiratory effort. CV.  Regular rate and rhythm, no JVD, rub or murmur. Abdomen.  Soft, nontender, nondistended, BS positive. CNS.  Alert and oriented .  No focal neurologic deficit. Extremities.  No edema, no cyanosis, pulses intact and symmetrical. Psychiatry.  Judgment and insight appears normal.   Condition at discharge: stable  The results of significant diagnostics from this hospitalization (including imaging, microbiology, ancillary and laboratory) are listed below for reference.   Imaging Studies: ECHOCARDIOGRAM COMPLETE  Result Date: 01/22/2022    ECHOCARDIOGRAM REPORT   Patient Name:   Helen Reid Date of Exam: 01/22/2022 Medical Rec #:  [id number]     Height:       64.0 in Accession #:    [id number]    Weight:       161.8 lb Date of Birth:  09-24-1960     BSA:          1.788 m Patient Age:    61 years      BP:           121/55 mmHg Patient Gender: F             HR:           44 bpm. Exam Location:  [facility] Procedure: 2D Echo, Cardiac Doppler and Color Doppler Indications:     Chest pain R07.9  History:         Patient has prior history of Echocardiogram examinations, most                  recent 10/15/2015. Risk Factors:Hypertension. Anxiety, PTSD.  Sonographer:     Sherrie Sport Referring Phys:  [id number] Rise Mu Diagnosing Phys: Ida Rogue MD IMPRESSIONS  1. Left ventricular ejection fraction, by estimation, is 60 to 65%. The left ventricle has normal function. The left ventricle has no regional wall motion abnormalities. There is mild left ventricular hypertrophy. Left ventricular diastolic parameters were normal.  2. Right ventricular systolic function is normal. The right ventricular size is normal. There is normal pulmonary artery systolic pressure. The estimated right ventricular systolic pressure is 77.9 mmHg.  3. A small pericardial effusion is present.  4. The mitral valve is normal in structure. No evidence of mitral valve regurgitation. No evidence of mitral stenosis.  5. The aortic  valve is normal in structure. Aortic valve regurgitation is mild. No aortic stenosis is present. Aortic valve area, by VTI measures 3.27 cm.  6. The inferior vena cava is normal in size with greater than 50% respiratory variability, suggesting right atrial pressure of 3 mmHg. FINDINGS  Left Ventricle: Left ventricular ejection fraction, by estimation, is 60 to 65%. The left ventricle has normal function. The left ventricle has no regional wall motion abnormalities. The left ventricular internal cavity size was normal in size. There is  mild left ventricular hypertrophy. Left ventricular diastolic parameters were normal. Right Ventricle: The right ventricular size is normal. No increase in right ventricular wall thickness. Right ventricular systolic function is normal. There is normal pulmonary artery systolic pressure. The tricuspid regurgitant velocity is 1.70 m/s, and  with an assumed right atrial pressure of 5 mmHg, the estimated right ventricular systolic pressure is 71.6 mmHg. Left Atrium: Left atrial size was normal in size. Right Atrium: Right atrial size was normal in size. Pericardium: A small pericardial effusion is present. Mitral Valve: The mitral valve is normal in structure. There is mild calcification of the mitral valve leaflet(s). Mild mitral annular calcification. No evidence of mitral valve regurgitation. No evidence of mitral valve stenosis. Tricuspid Valve: The tricuspid valve is normal in structure. Tricuspid valve regurgitation is mild . No evidence of tricuspid stenosis. Aortic Valve: The aortic valve is normal in structure. Aortic valve regurgitation is mild. Aortic regurgitation PHT measures 435 msec. No aortic stenosis is present. Aortic valve mean gradient measures 4.0 mmHg. Aortic valve peak gradient measures 8.0 mmHg. Aortic valve area, by VTI measures 3.27 cm. Pulmonic Valve: The pulmonic valve was normal in structure. Pulmonic valve regurgitation is not visualized. No evidence of  pulmonic stenosis. Aorta: The aortic root is normal in size and structure. Venous: The inferior vena cava is normal in size with greater than 50% respiratory variability, suggesting right atrial pressure of 3 mmHg. IAS/Shunts: No atrial level shunt detected by color flow Doppler.  LEFT VENTRICLE PLAX 2D LVIDd:         4.10 cm   Diastology LVIDs:         2.50 cm   LV e' medial:    5.33 cm/s LV PW:         1.20 cm   LV E/e' medial:  14.3 LV IVS:        1.50 cm   LV e' lateral:   8.81 cm/s LVOT diam:     2.00 cm   LV E/e' lateral: 8.7 LV SV:         105 LV SV Index:   59 LVOT Area:     3.14 cm  RIGHT VENTRICLE RV S prime:     13.10 cm/s TAPSE (M-mode): 2.0 cm LEFT ATRIUM             Index        RIGHT ATRIUM           Index LA diam:        2.90 cm 1.62 cm/m   RA Area:     13.20 cm LA Vol (A2C):   52.4 ml 29.31 ml/m  RA Volume:   35.10 ml  19.63 ml/m LA Vol (A4C):   19.4 ml 10.85 ml/m LA Biplane Vol: 33.2 ml 18.57 ml/m  AORTIC VALVE AV Area (Vmax):    3.03 cm AV Area (Vmean):   3.41 cm AV Area (VTI):     3.27 cm AV Vmax:  141.00 cm/s AV Vmean:          88.500 cm/s AV VTI:            0.320 m AV Peak Grad:      8.0 mmHg AV Mean Grad:      4.0 mmHg LVOT Vmax:         136.00 cm/s LVOT Vmean:        96.200 cm/s LVOT VTI:          0.333 m LVOT/AV VTI ratio: 1.04 AI PHT:            435 msec  AORTA Ao Root diam: 3.07 cm MITRAL VALVE               TRICUSPID VALVE MV Area (PHT): 3.13 cm    TR Peak grad:   11.6 mmHg MV Decel Time: 242 msec    TR Vmax:        170.00 cm/s MV E velocity: 76.30 cm/s MV A velocity: 51.00 cm/s  SHUNTS MV E/A ratio:  1.50        Systemic VTI:  0.33 m                            Systemic Diam: 2.00 cm Ida Rogue MD Electronically signed by Ida Rogue MD Signature Date/Time: 01/22/2022/12:52:35 PM    Final    DG Chest Portable 1 View  Result Date: 01/22/2022 CLINICAL DATA:  Bradycardia EXAM: PORTABLE CHEST 1 VIEW COMPARISON:  01/21/2022 FINDINGS: Cardiac and mediastinal contours  are within normal limits given AP technique. No focal pulmonary opacity. No pleural effusion or pneumothorax. No acute osseous abnormality. IMPRESSION: No acute cardiopulmonary process. Electronically Signed   By: Merilyn Baba M.D.   On: 01/22/2022 02:36   CT HEAD WO CONTRAST (5MM)  Result Date: 01/21/2022 CLINICAL DATA:  Trauma, multiple falls CT HEAD WITHOUT CONTRAST TECHNIQUE: Contiguous axial images were obtained from the base of the skull through the vertex without intravenous contrast. RADIATION DOSE REDUCTION: This exam was performed according to the departmental dose-optimization program which includes automated exposure control, adjustment of the mA and/or kV according to patient size and/or use of iterative reconstruction technique. COMPARISON:  06/19/2007 FINDINGS: Brain: No acute intracranial findings are seen. There are no signs of bleeding within the cranium. Ventricles are nondilated. Cortical sulci are prominent. Vascular: Unremarkable. Skull: Unremarkable. Sinuses/Orbits: Paranasal sinuses are unremarkable. There is leftward gaze in both optic globes. There is prominence of lacrimal glands. Other: None. IMPRESSION: No acute intracranial findings are seen.  Atrophy. Electronically Signed   By: Elmer Picker M.D.   On: 01/21/2022 11:35   DG Hip Unilat W or Wo Pelvis 2-3 Views Left  Result Date: 01/21/2022 CLINICAL DATA:  FALL, LEFT HIP PAIN EXAM: DG HIP (WITH OR WITHOUT PELVIS) 2-3V LEFT COMPARISON:  12/16/2020 FINDINGS: There is no evidence of hip fracture or dislocation. There is no evidence of arthropathy or other focal bone abnormality. IMPRESSION: Negative. Electronically Signed   By: Davina Poke D.O.   On: 01/21/2022 09:40   DG Ribs Unilateral W/Chest Left  Result Date: 01/21/2022 CLINICAL DATA:  Fall, left rib pain EXAM: LEFT RIBS AND CHEST - 3+ VIEW COMPARISON:  12/16/2020 FINDINGS: No left rib fracture or other bone lesions are seen. There is no evidence of pneumothorax  or pleural effusion. Both lungs are clear. Heart size and mediastinal contours are within normal limits. IMPRESSION: Negative. Electronically Signed   By:  Nicholas  Plundo D.O.   On: 01/21/2022 09:35    Microbiology: Results for orders placed or performed during the hospital encounter of 01/21/22  SARS Coronavirus 2 by RT PCR (hospital order, performed in [facility] hospital lab) *cepheid single result test* Anterior Nasal Swab     Status: None   Collection Time: 01/22/22  3:38 AM   Specimen: Anterior Nasal Swab  Result Value Ref Range Status   SARS Coronavirus 2 by RT PCR NEGATIVE NEGATIVE Final    Comment: (NOTE) SARS-CoV-2 target nucleic acids are NOT DETECTED.  The SARS-CoV-2 RNA is generally detectable in upper and lower respiratory specimens during the acute phase of infection. The lowest concentration of SARS-CoV-2 viral copies this assay can detect is 250 copies / mL. A negative result does not preclude SARS-CoV-2 infection and should not be used as the sole basis for treatment or other patient management decisions.  A negative result may occur with improper specimen collection / handling, submission of specimen other than nasopharyngeal swab, presence of viral mutation(s) within the areas targeted by this assay, and inadequate number of viral copies (<250 copies / mL). A negative result must be combined with clinical observations, patient history, and epidemiological information.  Fact Sheet for Patients:   https://www.patel.info/  Fact Sheet for Healthcare Providers: https://hall.com/  This test is not yet approved or  cleared by the [place] FDA and has been authorized for detection and/or diagnosis of SARS-CoV-2 by FDA under an Emergency Use Authorization (EUA).  This EUA will remain in effect (meaning this test can be used) for the duration of the COVID-19 declaration under Section 564(b)(1) of the Act, 21 U.S.C. section  360bbb-3(b)(1), unless the authorization is terminated or revoked sooner.  Performed at [facility], [address] [place], [place] [address]     Labs: CBC: Recent Labs  Lab 01/21/22 0845 01/22/22 0248 01/22/22 0540  WBC 2.9* 3.0* 2.7*  NEUTROABS  --  1.4*  --   HGB 12.5 12.3 11.3*  HCT 39.8 39.6 35.8*  MCV 73.6* 74.4* 74.9*  PLT 207 192 604*   Basic Metabolic Panel: Recent Labs  Lab 01/21/22 0845 01/22/22 0248  NA 140 138  K 4.0 4.0  CL 106 105  CO2 28 28  GLUCOSE 75 88  BUN 13 19  CREATININE 1.10* 1.18*  CALCIUM 9.4 9.4  MG 2.3 2.4   Liver Function Tests: No results for input(s): "AST", "ALT", "ALKPHOS", "BILITOT", "PROT", "ALBUMIN" in the last 168 hours. CBG: No results for input(s): "GLUCAP" in the last 168 hours.  Discharge time spent: greater than 30 minutes.  This record has been created using Systems analyst. Errors have been sought and corrected,but may not always be located. Such creation errors do not reflect on the standard of care.   Signed: Lorella Nimrod, MD [facility] 01/22/2022

## 2022-01-24 LAB — URINE CULTURE: Culture: >=100000 — AB

## 2022-01-28 ENCOUNTER — Ambulatory Visit: Payer: Medicaid Other | Admitting: Cardiovascular Disease

## 2022-01-29 NOTE — Progress Notes (Deleted)
Cardiology Office Note    Date:  01/29/2022   ID:  Helen Reid, DOB [birth year], MRN [id number]  PCP:  [name], PA-C  Cardiologist:  Kathlyn Sacramento, MD  Electrophysiologist:  None   Chief Complaint: Hospital follow-up  History of Present Illness:   Helen Reid is a 61 y.o. female with history of uncontrolled hypothyroidism, medical nonadherence, reported lymphoma, breast cancer, and colorectal cancer without treatment per her report, HTN, untreated sleep apnea, history of substance abuse, chronic chest pain "all my life", and vertigo who presents for hospital follow as below.   Helen Reid was previously evaluated in our office in 2017 for bradycardia with 24-hour Holter at that time showing a predominant rhythm of sinus with an average rate of 62 bpm with a range of 42 to 114 bpm with the longest R to R interval 1.6 seconds along with rare PACs and PVCs.  Lexiscan MPI at that time showed no evidence of ischemia and was overall low risk with preserved LV systolic function.  Echo showed an EF of 60 to 65%, no regional wall motion abnormalities, moderate aortic insufficiency, mild mitral regurgitation, moderate tricuspid regurgitation, and a trivial pericardial effusion.  She reports that she subsequently moved to [place] and was diagnosed with colorectal cancer, though reports no surgical intervention or chemoradiation.  Further details of this are unclear.  She reports she does not take medications other than her "herbs."     More recently, she has had multiple falls and describes this as a sensation of "bone-on-bone" in her hips and knees.  She indicates she will try to walk and her leg will "lock up" causing her to fall.  She has been without frank syncope.    She was admitted to [facility] on 9/8-9/8 with recurrent falls and chronic chest pain.  She was noted to be bradycardic with heart rates ranging from the 30s to 60s bpm.  BP stable.  High-sensitivity troponin negative x3.  EKG showed  sinus bradycardia without evidence of high-grade AV block or prolonged pauses.  TSH elevated at 73.102 with a free T4 low at 0.26.  Urine drug screen positive for cannabinoid.  Potassium 4.0.  Magnesium 2.3.  Chest x-ray without acute process x2.  No rib fracture.  No hip fracture.  CT head nonacute.  She was not on any AV nodal blocking medications.  Her bradycardia was felt to be in the context of her significant hypothyroidism and was minimally symptomatic.  Her falls were felt to be related to her underlying arthritis.  Zio patch was placed and is pending at this time.  Treatment of her hypothyroidism was recommended.   *** Pancytopenia   Labs independently reviewed: 01/2022 - TSH 73.102, free T4 low at 0.26, Hgb 11.3, PLT 134, magnesium 2.4, potassium 4.0, BUN 19, serum creatinine 1.18 09/2021 - A1c 5.9, TC 390, TG 86, HDL 106, LDL 272, albumin 4.7, AST/ALT normal  Past Medical History:  Diagnosis Date   Anxiety    Cancer ([place])    lymphoma, gallbladder, breast   Depression    Hypertension    IBS (irritable bowel syndrome)    PTSD (post-traumatic stress disorder)    Vitamin D deficiency 09/27/2015    Past Surgical History:  Procedure Laterality Date   BREAST SURGERY     CHOLECYSTECTOMY      Current Medications: No outpatient medications have been marked as taking for the 02/01/22 encounter (Appointment) with Rise Mu, PA-C.    Allergies:   Peanut-containing drug  products, Penicillins, Aspirin, Dilantin  [phenytoin], Macrolides and ketolides, and Phenobarbital   Social History   Socioeconomic History   Marital status: Single    Spouse name: Not on file   Number of children: 4   Years of education: Not on file   Highest education level: Not on file  Occupational History   Not on file  Tobacco Use   Smoking status: Some Days    Packs/day: 0.50    Years: 25.00    Total pack years: 12.50    Types: Cigarettes    Start date: 1966   Smokeless tobacco: Never  Substance  and Sexual Activity   Alcohol use: Yes    Alcohol/week: 1.0 standard drink of alcohol    Types: 1 Glasses of wine per week   Drug use: Yes    Types: Marijuana, "Crack" cocaine   Sexual activity: Yes    Partners: Male  Other Topics Concern   Not on file  Social History Narrative   Not on file   Social Determinants of Health   Financial Resource Strain: Not on file  Food Insecurity: Not on file  Transportation Needs: Not on file  Physical Activity: Not on file  Stress: Not on file  Social Connections: Not on file     Family History:  The patient's family history includes Asthma in her mother; Heart disease in her father and mother; Hypertension in her mother.  ROS:   ROS   EKGs/Labs/Other Studies Reviewed:    Studies reviewed were summarized above. The additional studies were reviewed today:  2D echo 01/22/2022: 1. Left ventricular ejection fraction, by estimation, is 60 to 65%. The  left ventricle has normal function. The left ventricle has no regional  wall motion abnormalities. There is mild left ventricular hypertrophy.  Left ventricular diastolic parameters  were normal.   2. Right ventricular systolic function is normal. The right ventricular  size is normal. There is normal pulmonary artery systolic pressure. The  estimated right ventricular systolic pressure is 38.1 mmHg.   3. A small pericardial effusion is present.   4. The mitral valve is normal in structure. No evidence of mitral valve  regurgitation. No evidence of mitral stenosis.   5. The aortic valve is normal in structure. Aortic valve regurgitation is  mild. No aortic stenosis is present. Aortic valve area, by VTI measures  3.27 cm.   6. The inferior vena cava is normal in size with greater than 50%  respiratory variability, suggesting right atrial pressure of 3 mmHg. __________  24-hour Holter 11/2015: overall rhythm is sinus rhythm. Heart rate ranged from 42-114 bpm, average of 62 BPM. Longest RR  interval was normal at 1.6 seconds.   No high grade supraventricular ectopy: 62 isolated PACs, 2 atrial couplets.   No high-grade ventricular ectopy: 179 isolated PVCs. __________   Carlton Adam MPI 10/17/2015: There was no ST segment deviation noted during stress. Defect 1: There is a small defect of mild severity present in the apex location. This is likely due to breast attenuation. The study is normal. This is a low risk study. The left ventricular ejection fraction is normal (55-65%). __________   2D echo 10/15/2015: - Left ventricle: The cavity size was normal. Wall thickness was    normal. Systolic function was normal. The estimated ejection    fraction was in the range of 60% to 65%. Wall motion was normal;    there were no regional wall motion abnormalities. Doppler    parameters are consistent  with high ventricular filling pressure.  - Aortic valve: There was moderate regurgitation.  - Mitral valve: There was mild regurgitation.  - Tricuspid valve: There was moderate regurgitation.  - Pericardium, extracardiac: A trivial pericardial effusion was    identified.   EKG:  EKG is ordered today.  The EKG ordered today demonstrates ***  Recent Labs: 09/15/2021: ALT 23 01/22/2022: BUN 19; Creatinine, Ser 1.18; Hemoglobin 11.3; Magnesium 2.4; Platelets 134; Potassium 4.0; Sodium 138; TSH 73.102  Recent Lipid Panel    Component Value Date/Time   CHOL 390 (H) 09/15/2021 1534   CHOL 160 12/22/2013 0610   TRIG 86 09/15/2021 1534   TRIG 76 12/22/2013 0610   HDL 106 09/15/2021 1534   HDL 57 12/22/2013 0610   CHOLHDL 3.7 09/15/2021 1534   CHOLHDL 3.7 05/27/2015 0611   VLDL 16 05/27/2015 0611   VLDL 15 12/22/2013 0610   LDLCALC 272 (H) 09/15/2021 1534   LDLCALC 88 12/22/2013 0610    PHYSICAL EXAM:    VS:  There were no vitals taken for this visit.  BMI: There is no height or weight on file to calculate BMI.  Physical Exam  Wt Readings from Last 3 Encounters:  01/21/22 161 lb  13.1 oz (73.4 kg)  09/15/21 161 lb 14.4 oz (73.4 kg)  12/16/20 140 lb (63.5 kg)     ASSESSMENT & PLAN:   Sinus bradycardia:  Chronic atypical chest pain: High sensitivity troponin negative x 3 during recent admission.  Uncontrolled hypothyroidism:  Frequent falls:  Reported lymphoma, breast cancer, and colorectal cancer: Details are unclear. ***  Reported untreated sleep apnea:  Medical nonadherence:     {Are you ordering a CV Procedure (e.g. stress test, cath, DCCV, TEE, etc)?   Press F2        :[id number]}     Disposition: F/u with Dr. Fletcher Anon or an APP in ***.   Medication Adjustments/Labs and Tests Ordered: Current medicines are reviewed at length with the patient today.  Concerns regarding medicines are outlined above. Medication changes, Labs and Tests ordered today are summarized above and listed in the Patient Instructions accessible in Encounters.   Signed, Christell Faith, PA-C 01/29/2022 8:48 AM     [facility] [address] [place], [place] [address] [phone]

## 2022-02-01 ENCOUNTER — Ambulatory Visit: Payer: Medicaid Other | Attending: Cardiovascular Disease | Admitting: Physician Assistant

## 2022-02-01 DIAGNOSIS — Z91199 Patient's noncompliance with other medical treatment and regimen due to unspecified reason: Secondary | ICD-10-CM | POA: Insufficient documentation

## 2022-02-01 DIAGNOSIS — E039 Hypothyroidism, unspecified: Secondary | ICD-10-CM | POA: Insufficient documentation

## 2022-02-01 DIAGNOSIS — G8929 Other chronic pain: Secondary | ICD-10-CM | POA: Insufficient documentation

## 2022-02-01 DIAGNOSIS — R001 Bradycardia, unspecified: Secondary | ICD-10-CM | POA: Insufficient documentation

## 2022-02-01 DIAGNOSIS — R079 Chest pain, unspecified: Secondary | ICD-10-CM | POA: Insufficient documentation

## 2022-02-01 DIAGNOSIS — R296 Repeated falls: Secondary | ICD-10-CM | POA: Insufficient documentation

## 2022-02-08 ENCOUNTER — Ambulatory Visit (LOCAL_COMMUNITY_HEALTH_CENTER): Payer: Self-pay

## 2022-02-08 DIAGNOSIS — Z111 Encounter for screening for respiratory tuberculosis: Secondary | ICD-10-CM

## 2022-02-08 NOTE — Progress Notes (Signed)
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 [employer] is paying for ppd.  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 Pt says she had positive skin test as child and took TB meds. Does not have any details or documents with her. She also explains that she has had negative ppd's since that time.   PPD placed today. Josie Saunders, RN

## 2022-02-11 ENCOUNTER — Ambulatory Visit (LOCAL_COMMUNITY_HEALTH_CENTER): Payer: Self-pay

## 2022-02-11 DIAGNOSIS — Z111 Encounter for screening for respiratory tuberculosis: Secondary | ICD-10-CM

## 2022-02-11 LAB — TB SKIN TEST
Induration: 0 mm
TB Skin Test: NEGATIVE

## 2022-02-17 ENCOUNTER — Ambulatory Visit: Payer: Medicaid Other | Admitting: Physician Assistant

## 2022-02-18 ENCOUNTER — Ambulatory Visit: Payer: Medicaid Other | Admitting: Physician Assistant

## 2022-02-25 ENCOUNTER — Ambulatory Visit (INDEPENDENT_AMBULATORY_CARE_PROVIDER_SITE_OTHER): Payer: Medicaid Other | Admitting: Physician Assistant

## 2022-02-25 ENCOUNTER — Encounter: Payer: Self-pay | Admitting: Physician Assistant

## 2022-02-25 VITALS — BP 136/76 | HR 58 | Resp 16 | Wt 162.0 lb

## 2022-02-25 DIAGNOSIS — F419 Anxiety disorder, unspecified: Secondary | ICD-10-CM | POA: Diagnosis not present

## 2022-02-25 DIAGNOSIS — K582 Mixed irritable bowel syndrome: Secondary | ICD-10-CM | POA: Diagnosis not present

## 2022-02-25 DIAGNOSIS — N182 Chronic kidney disease, stage 2 (mild): Secondary | ICD-10-CM

## 2022-02-25 DIAGNOSIS — J4531 Mild persistent asthma with (acute) exacerbation: Secondary | ICD-10-CM

## 2022-02-25 DIAGNOSIS — E039 Hypothyroidism, unspecified: Secondary | ICD-10-CM

## 2022-02-25 DIAGNOSIS — F325 Major depressive disorder, single episode, in full remission: Secondary | ICD-10-CM
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
## 2022-03-08 NOTE — Addendum Note (Signed)
Encounter addended by: Jeannette How on: 03/08/2022 7:51 AM  Actions taken: Imaging Exam ended

## 2022-03-12 ENCOUNTER — Telehealth: Payer: Self-pay

## 2022-03-12 NOTE — Chronic Care Management (AMB) (Signed)
   Care Guide Note  03/12/2022 Name: Violet Seabury MRN: [id number] DOB: March 20, 1961  Referred by: [name], PA-C Reason for referral : Care Coordination (Outreach to schedule referral with Pharm D )   Georgi Tuel is a 61 y.o. year old female who is a primary care patient of [name], [place]. Jakara Abplanalp was referred to the pharmacist for assistance related to DM.    Successful contact was made with the patient to discuss pharmacy services including being ready for the pharmacist to call at least 5 minutes before the scheduled appointment time, to have medication bottles and any blood sugar or blood pressure readings ready for review. The patient agreed to meet with the pharmacist via with the pharmacist via telephone visit on 03/31/2022.    Noreene Larsson, [place], [place] [address] Direct Dial: [phone] [email or web address]

## 2022-03-30 ENCOUNTER — Ambulatory Visit: Payer: Medicaid Other | Admitting: Physician Assistant

## 2022-03-30 DIAGNOSIS — E039 Hypothyroidism, unspecified: Secondary | ICD-10-CM

## 2022-03-30 DIAGNOSIS — K219 Gastro-esophageal reflux disease without esophagitis: Secondary | ICD-10-CM

## 2022-03-30 DIAGNOSIS — F1220 Cannabis dependence, uncomplicated: Secondary | ICD-10-CM

## 2022-03-30 DIAGNOSIS — F17200 Nicotine dependence, unspecified, uncomplicated: Secondary | ICD-10-CM

## 2022-03-30 DIAGNOSIS — A159 Respiratory tuberculosis unspecified: Secondary | ICD-10-CM

## 2022-03-30 DIAGNOSIS — E781 Pure hyperglyceridemia: Secondary | ICD-10-CM

## 2022-03-30 DIAGNOSIS — F325 Major depressive disorder, single episode, in full remission: Secondary | ICD-10-CM

## 2022-03-30 DIAGNOSIS — J4531 Mild persistent asthma with (acute) exacerbation: Secondary | ICD-10-CM

## 2022-03-30 DIAGNOSIS — N182 Chronic kidney disease, stage 2 (mild): Secondary | ICD-10-CM

## 2022-03-30 DIAGNOSIS — F419 Anxiety disorder, unspecified: Secondary | ICD-10-CM

## 2022-03-30 NOTE — Progress Notes (Deleted)
      Established patient visit   Patient: Helen Reid   DOB: [birth year]   61 y.o. Female  MRN: [id number] Visit Date: 03/30/2022  Today's healthcare provider: [name], PA-C   No chief complaint on file.  Subjective    HPI  ***  Medications: Outpatient Medications Prior to Visit  Medication Sig   albuterol (VENTOLIN HFA) 108 (90 Base) MCG/ACT inhaler Inhale 2 puffs into the lungs every 6 (six) hours as needed for wheezing or shortness of breath.   citalopram (CELEXA) 10 MG tablet Take 10 mg by mouth daily. (Patient not taking: Reported on 02/25/2022)   EPINEPHrine (EPIPEN 2-PAK) 0.3 mg/0.3 mL IJ SOAJ injection Inject 0.3 mg into the muscle as needed for anaphylaxis. (Patient not taking: Reported on 01/21/2022)   fluticasone-salmeterol (ADVAIR) 100-50 MCG/ACT AEPB Inhale 1 puff into the lungs 2 (two) times daily. (Patient not taking: Reported on 01/21/2022)   levothyroxine (SYNTHROID) 100 MCG tablet Take 1 tablet (100 mcg total) by mouth daily.   Multiple Vitamins-Minerals (MULTIVITAMIN WITH MINERALS) tablet Take 1 tablet by mouth daily.   QUEtiapine (SEROQUEL) 100 MG tablet Take 100 mg by mouth at bedtime. (Patient not taking: Reported on 02/08/2022)   No facility-administered medications prior to visit.    Review of Systems  {Labs  Heme  Chem  Endocrine  Serology  Results Review (optional):23779}   Objective    There were no vitals taken for this visit. {Show previous vital signs (optional):23777}  Physical Exam  ***  No results found for any visits on 03/30/22.  Assessment & Plan     ***  No follow-ups on file.      {provider attestation***:1}   [name], Hershal Coria  [facility] [phone] (phone) [phone] (fax)  [place]

## 2022-03-31 ENCOUNTER — Other Ambulatory Visit: Payer: Self-pay | Admitting: Physician Assistant

## 2022-03-31 ENCOUNTER — Other Ambulatory Visit: Payer: Medicaid Other | Admitting: Pharmacist

## 2022-03-31 ENCOUNTER — Other Ambulatory Visit (HOSPITAL_COMMUNITY): Payer: Self-pay

## 2022-03-31 DIAGNOSIS — J4531 Mild persistent asthma with (acute) exacerbation: Secondary | ICD-10-CM

## 2022-03-31 DIAGNOSIS — E039 Hypothyroidism, unspecified: Secondary | ICD-10-CM

## 2022-03-31 DIAGNOSIS — T7800XA Anaphylactic reaction due to unspecified food, initial encounter: Secondary | ICD-10-CM

## 2022-03-31 MED ORDER — LEVOTHYROXINE SODIUM 100 MCG PO TABS
100.0000 ug | ORAL_TABLET | Freq: Every day | ORAL | 2 refills | Status: AC
  Filled 2022-03-31: qty 30, 30d supply, fill #0

## 2022-03-31 MED ORDER — FLUTICASONE-SALMETEROL 100-50 MCG/ACT IN AEPB
1.0000 | INHALATION_SPRAY | Freq: Two times a day (BID) | RESPIRATORY_TRACT | 3 refills | Status: AC
  Filled 2022-03-31: qty 60, 30d supply, fill #0

## 2022-03-31 MED ORDER — ALBUTEROL SULFATE HFA 108 (90 BASE) MCG/ACT IN AERS
2.0000 | INHALATION_SPRAY | Freq: Four times a day (QID) | RESPIRATORY_TRACT | 2 refills | Status: AC | PRN
  Filled 2022-03-31: qty 6.7, 25d supply, fill #0

## 2022-03-31 MED ORDER — EPINEPHRINE 0.3 MG/0.3ML IJ SOAJ
0.3000 mg | INTRAMUSCULAR | 2 refills | Status: AC | PRN
  Filled 2022-03-31: qty 2, 7d supply, fill #0

## 2022-03-31 NOTE — Progress Notes (Signed)
I agree with the assessment and plan of the resident.   Catie Hedwig Morton, PharmD, [facility] [phone]

## 2022-03-31 NOTE — Progress Notes (Signed)
Changed pharmacy, resent all med refills.

## 2022-03-31 NOTE — Patient Instructions (Addendum)
It was nice to talk to you today!  Your primary care provider has sent your prescriptions to [facility] so your medications can be delivered to you. Please call [facility] [phone]) and provide them with your insurance information so they can work on processing your medications.   Joseph Art, Pharm.D. PGY-2 Ambulatory Care Pharmacy Resident

## 2022-03-31 NOTE — Chronic Care Management (AMB) (Signed)
03/31/2022 Name: Annastasia Haskins MRN: [id number] DOB: [birth year]  Chief Complaint  Patient presents with   Medication Access    Felix Baus is a 61 y.o. year old female who presented for a telephone visit.   They were referred to the pharmacist by their PCP for assistance in medication access.   Subjective:  Care Team: Primary Care Provider: Elberta Leatherwood ; Next Scheduled Visit: not yet scheduled  Medication Access/Adherence  Current Pharmacy:  [facility], [place] [address] Phone: [phone] Fax: [phone]  [id number] [place] [address] [place]Phone: [phone]: [address]Phone: [phone]: [phone]  Patient reports affordability concerns with their medications: Yes - Medicaid copays are unaffordable for her. Recently had her food stamps cut.  Patient reports access/transportation concerns to their pharmacy: Yes - uses medical transportation to get to appointments  Patient reports adherence concerns with their medications:  Yes  has been out of medications for many months.    Current medications include:  albuterol PRN, Advair 1 puff BID, levothyroxine 100 mcg daily, and epi pen PRN -She is no longer on Seroquel (made her fell sick) nor citalopram.    Health Maintenance  Health Maintenance Due  Topic Date Due   COVID-19 Vaccine (1) Never done   HIV Screening  Never done   Hepatitis C Screening  Never done   PAP SMEAR-Modifier  Never done   MAMMOGRAM  Never done   Zoster Vaccines- Shingrix (1 of 2) Never done   Fecal DNA (Cologuard)  08/27/2018   INFLUENZA VACCINE  Never done     Objective: Lab Results  Component Value Date   HGBA1C 5.9 (H) 09/15/2021    Lab Results  Component Value Date   CREATININE 1.18  (H) 01/22/2022   BUN 19 01/22/2022   NA 138 01/22/2022   K 4.0 01/22/2022   CL 105 01/22/2022   CO2 28 01/22/2022    Lab Results  Component Value Date   CHOL 390 (H) 09/15/2021   HDL 106 09/15/2021   LDLCALC 272 (H) 09/15/2021   TRIG 86 09/15/2021   CHOLHDL 3.7 09/15/2021    Medications Reviewed Today     Reviewed by MJulieta Bellini CMA (Certified Medical Assistant) on 02/25/22 at 1008  Med List Status: <None>   Medication Order Taking? Sig Documenting Provider Last Dose Status Informant  albuterol (VENTOLIN HFA) 108 (90 Base) MCG/ACT inhaler [id number] Inhale 2 puffs into the lungs every 6 (six) hours as needed for wheezing or shortness of breath. OMardene Speak PA-C Taking Active Self  citalopram (CELEXA) 10 MG tablet [id number] Take 10 mg by mouth daily.  Patient not taking: Reported on 02/25/2022   [provider] Not Taking Expired 10/28/17 2359   EPINEPHrine (EPIPEN 2-PAK) 0.3 mg/0.3 mL IJ SOAJ injection [id number] Inject 0.3 mg into the muscle as needed for anaphylaxis.  Patient not taking: Reported on 01/21/2022   MNena Polio MD Not Taking Active Self  fluticasone-salmeterol (ADVAIR) 100-50 MCG/ACT AEPB [id number] Inhale 1 puff into the lungs 2 (two) times daily.  Patient not taking: Reported on 01/21/2022   OMardene Speak PA-C Not Taking Active Self  levothyroxine (SYNTHROID) 100 MCG tablet [id number] Take 1 tablet (100 mcg total) by mouth daily. ALorella Nimrod  MD Taking Active   Multiple Vitamins-Minerals (MULTIVITAMIN WITH MINERALS) tablet [id number] Yes Take 1 tablet by mouth daily. Lorella Nimrod, MD Taking Active   QUEtiapine (SEROQUEL) 100 MG tablet [id number] No Take 100 mg by mouth at bedtime.  Patient not taking: Reported on 02/08/2022   [provider] Not Taking Active Self             Assessment/Plan:   Medication Management: - Medication reconciliation performed. Addressed inability to afford copays.  - Discussed  collaboration with local pharmacies for medication assistance. Patient requires a pharmacy that delivers as she does not have transportation. Patient agreeable to have her prescriptions sent to [facility].  - Collaborated with PCP to have refills sent to [facility]. Confirmed the prescriptions were sent and received. However, [facility] does not have patient's Medicaid insurance information on file.  - Instructed patient to call and provide [facility] with her Medicaid insurance information so she can have her medications delivered to her.   Follow Up Plan: PRN  Joseph Art, Pharm.D. PGY-2 Ambulatory Care Pharmacy Resident 03/31/2022 11:23 AM

## 2022-04-09 ENCOUNTER — Other Ambulatory Visit (HOSPITAL_COMMUNITY): Payer: Self-pay

## 2022-06-14 ENCOUNTER — Telehealth: Payer: Self-pay | Admitting: Physician Assistant

## 2022-06-14 NOTE — Telephone Encounter (Signed)
Asia from [facility] called asking if patient can get RX for pullups. Please call back

## 2022-06-16 NOTE — Telephone Encounter (Signed)
Please advise 

## 2022-06-18 NOTE — Telephone Encounter (Signed)
I attempted to contact Helen Reid from [facility] to confirm where she would like me to send over the prescription for the Adult diapers or if we could just give a verbal order. No answer left a detailed message on her voicemail.

## 2022-06-21 NOTE — Telephone Encounter (Signed)
Discussed with [place] and she states she is not 100% sure of options but once she is back in office she will reach out to patients case manager to get further information

## 2023-03-02 ENCOUNTER — Emergency Department: Payer: MEDICAID

## 2023-03-02 ENCOUNTER — Emergency Department
Admission: EM | Admit: 2023-03-02 | Discharge: 2023-03-03 | Disposition: A | Discharge status: Home / Self Care | Payer: MEDICAID | Attending: Emergency Medicine | Admitting: Emergency Medicine

## 2023-03-02 ENCOUNTER — Emergency Department
Admission: EM | Admit: 2023-03-02 | Discharge: 2023-03-02 | Disposition: A | Discharge status: Home / Self Care | Payer: MEDICAID

## 2023-03-02 ENCOUNTER — Other Ambulatory Visit: Payer: Self-pay

## 2023-03-02 DIAGNOSIS — R454 Irritability and anger: Secondary | ICD-10-CM | POA: Insufficient documentation

## 2023-03-02 DIAGNOSIS — R079 Chest pain, unspecified: Secondary | ICD-10-CM | POA: Diagnosis not present

## 2023-03-02 DIAGNOSIS — R45850 Homicidal ideations: Secondary | ICD-10-CM | POA: Insufficient documentation

## 2023-03-02 DIAGNOSIS — F329 Major depressive disorder, single episode, unspecified: Secondary | ICD-10-CM | POA: Insufficient documentation

## 2023-03-02 DIAGNOSIS — F419 Anxiety disorder, unspecified: Secondary | ICD-10-CM | POA: Diagnosis not present

## 2023-03-02 DIAGNOSIS — I10 Essential (primary) hypertension: Secondary | ICD-10-CM | POA: Insufficient documentation

## 2023-03-02 DIAGNOSIS — Z85850 Personal history of malignant neoplasm of thyroid: Secondary | ICD-10-CM | POA: Insufficient documentation

## 2023-03-02 DIAGNOSIS — Z79899 Other long term (current) drug therapy: Secondary | ICD-10-CM | POA: Insufficient documentation

## 2023-03-02 DIAGNOSIS — R45851 Suicidal ideations: Secondary | ICD-10-CM | POA: Insufficient documentation

## 2023-03-02 DIAGNOSIS — F603 Borderline personality disorder: Secondary | ICD-10-CM | POA: Insufficient documentation

## 2023-03-02 LAB — CBC
HCT: 34.3 % — ABNORMAL LOW (ref 36.0–46.0)
Hemoglobin: 10.8 g/dL — ABNORMAL LOW (ref 12.0–15.0)
MCH: 22.8 pg — ABNORMAL LOW (ref 26.0–34.0)
MCHC: 31.5 g/dL (ref 30.0–36.0)
MCV: 72.5 fL — ABNORMAL LOW (ref 80.0–100.0)
Platelets: 263 10*3/uL (ref 150–400)
RBC: 4.73 MIL/uL (ref 3.87–5.11)
RDW: 14.1 % (ref 11.5–15.5)
WBC: 5.3 10*3/uL (ref 4.0–10.5)
nRBC: 0 % (ref 0.0–0.2)

## 2023-03-02 LAB — URINE DRUG SCREEN, QUALITATIVE (ARMC ONLY)
Amphetamines, Ur Screen: NOT DETECTED
Barbiturates, Ur Screen: NOT DETECTED
Benzodiazepine, Ur Scrn: NOT DETECTED
Cannabinoid 50 Ng, Ur ~~LOC~~: POSITIVE — AB
Cocaine Metabolite,Ur ~~LOC~~: NOT DETECTED
MDMA (Ecstasy)Ur Screen: NOT DETECTED
Methadone Scn, Ur: NOT DETECTED
Opiate, Ur Screen: NOT DETECTED
Phencyclidine (PCP) Ur S: NOT DETECTED
Tricyclic, Ur Screen: NOT DETECTED

## 2023-03-02 LAB — COMPREHENSIVE METABOLIC PANEL
ALT: 17 U/L (ref 0–44)
AST: 18 U/L (ref 15–41)
Albumin: 3.8 g/dL (ref 3.5–5.0)
Alkaline Phosphatase: 58 U/L (ref 38–126)
Anion gap: 6 (ref 5–15)
BUN: 16 mg/dL (ref 8–23)
CO2: 28 mmol/L (ref 22–32)
Calcium: 9.5 mg/dL (ref 8.9–10.3)
Chloride: 105 mmol/L (ref 98–111)
Creatinine, Ser: 0.82 mg/dL (ref 0.44–1.00)
GFR, Estimated: >60 mL/min (ref >60)
Glucose, Bld: 84 mg/dL (ref 70–99)
Potassium: 4.3 mmol/L (ref 3.5–5.1)
Sodium: 139 mmol/L (ref 135–145)
Total Bilirubin: 0.4 mg/dL (ref 0.3–1.2)
Total Protein: 7.9 g/dL (ref 6.5–8.1)

## 2023-03-02 LAB — TSH: TSH: 0.208 u[IU]/mL — ABNORMAL LOW (ref 0.350–4.500)

## 2023-03-02 LAB — TROPONIN I (HIGH SENSITIVITY)
Troponin I (High Sensitivity): 5 ng/L (ref <18)
Troponin I (High Sensitivity): 5 ng/L (ref <18)

## 2023-03-02 LAB — T4, FREE: Free T4: 1.11 ng/dL (ref 0.61–1.12)

## 2023-03-02 LAB — ETHANOL: Alcohol, Ethyl (B): <10 mg/dL (ref <10)

## 2023-03-02 LAB — ACETAMINOPHEN LEVEL: Acetaminophen (Tylenol), Serum: <10 ug/mL — ABNORMAL LOW (ref 10–30)

## 2023-03-02 LAB — SALICYLATE LEVEL: Salicylate Lvl: <7 mg/dL — ABNORMAL LOW (ref 7.0–30.0)

## 2023-03-02 MED ORDER — LORAZEPAM 1 MG PO TABS
1.0000 mg | ORAL_TABLET | Freq: Once | ORAL | Status: AC
  Administered 2023-03-03: 1 mg via ORAL
  Filled 2023-03-02: qty 1

## 2023-03-02 MED ORDER — LORAZEPAM 1 MG PO TABS
1.0000 mg | ORAL_TABLET | Freq: Once | ORAL | Status: AC
  Administered 2023-03-02: 1 mg via ORAL
  Filled 2023-03-02: qty 1
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
## 2023-03-02 NOTE — ED Notes (Addendum)
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

## 2023-03-02 NOTE — ED Triage Notes (Signed)
Pt to ed from [facility]  via [facility] earlier for CP, SOB and suicidal thoughts. Pt left the hospital to go get food and then came back. Pt is CAOx4, in no acute distress and in no acute distress. Pt states "I am having chest pains, shortness of breath and suicidal thoughts". This RN educated pt on how this procedure works for suicidal patients. Pt was very disgruntled and loud about [place] taking her belongings, but then agreed to be complaint.

## 2023-03-02 NOTE — ED Notes (Signed)
Pt states "can I eat my food before you take my stuff?" RN advised she could eat quickly but could not take her food to a room with her.

## 2023-03-02 NOTE — ED Notes (Addendum)
Security present in triage middle. Pt is storming back and forth and locking herself in the bathroom. Pt was made aware that she is voluntary and can leave is she chooses to, but she is going to have to calm down.

## 2023-03-02 NOTE — ED Notes (Signed)
IVC per Dr. Arnoldo Morale

## 2023-03-02 NOTE — ED Notes (Signed)
Per MD Mumma pt can have 1mg  PO ativan and is cleared to go to the [facility].

## 2023-03-02 NOTE — ED Provider Notes (Signed)
[facility] Provider Note    Event Date/Time   First MD Initiated Contact with Patient 03/02/23 1731     (approximate)   History   Chest Pain and Psychiatric Evaluation   HPI  Helen Reid is a 62 y.o. female past medical history significant for anxiety, depression, PTSD, hypertension, lymphoma, who presents to the emergency department with chest pain.  Patient states that she initially called out for chest pain but that she is mostly having concern for stress and anxiety.  Thoughts of hurting herself and others.  Currently complaining of severe chest pain that she states is from her stress.  States she is feeling very overwhelmed from being told she needed to pay for an evaluation of her eye yesterday.  States that she also has cancer and problems with her thyroid.  Denies any abdominal pain at this time.  Denies any dysuria, urinary urgency or frequency.  States that she has been compliant with her home medications.  States that she is having thoughts of hurting other and feeling very overwhelmed and needing help.   Physical Exam   Triage Vital Signs: ED Triage Vitals  Encounter Vitals Group     BP 03/02/23 1707 (!) 175/86     Systolic BP Percentile --      Diastolic BP Percentile --      Pulse Rate 03/02/23 1705 65     Resp 03/02/23 1705 16     Temp 03/02/23 1705 98.1 F (36.7 C)     Temp Source 03/02/23 1705 Oral     SpO2 03/02/23 1705 96 %     Weight --      Height 03/02/23 1700 5\' 4"  (1.626 m)     Head Circumference --      Peak Flow --      Pain Score 03/02/23 1700 5     Pain Loc --      Pain Education --      Exclude from Growth Chart --     Most recent vital signs: Vitals:   03/02/23 1707 03/02/23 1946  BP: (!) 175/86 130/62  Pulse:  62  Resp:  16  Temp:    SpO2:  94%    Physical Exam Constitutional:      General: She is in acute distress.     Appearance: She is well-developed.  HENT:     Head: Atraumatic.  Eyes:      Conjunctiva/sclera: Conjunctivae normal.  Cardiovascular:     Rate and Rhythm: Regular rhythm.     Heart sounds: Normal heart sounds.  Pulmonary:     Effort: No respiratory distress.  Abdominal:     General: There is no distension.     Tenderness: There is no abdominal tenderness.  Musculoskeletal:        General: Normal range of motion.     Cervical back: Normal range of motion.     Right lower leg: No edema.     Left lower leg: No edema.  Skin:    General: Skin is warm.     Capillary Refill: Capillary refill takes less than 2 seconds.  Neurological:     Mental Status: She is alert. Mental status is at baseline.  Psychiatric:        Mood and Affect: Mood is anxious and depressed. Affect is angry and tearful.        Behavior: Behavior is agitated and hyperactive.        Thought Content: Thought content  includes homicidal and suicidal ideation. Thought content does not include homicidal or suicidal plan.     IMPRESSION / MDM / ASSESSMENT AND PLAN / ED COURSE  I reviewed the triage vital signs and the nursing notes.  Patient presents to the emergency department initially called EMS for chest pain but then later stated that she was having thoughts of suicide and thoughts of homicide.  States that she has thoughts of hurting others and is feeling very anxious and overwhelmed.  Differential diagnosis including stress/anxiety, ACS, pneumonia.  No pleuritic chest pain, no shortness of breath, have a lower suspicion for pulmonary embolism.   EKG  I, Corena Herter, the attending physician, personally viewed and interpreted this ECG.   Rate: Normal  Rhythm: Normal sinus  Axis: Normal  Intervals: Normal  ST&T Change: Inverted T wave to aVL.  Appears to have LVH.  LVH appears new when compared to prior EKG.  New T wave that is inverted.  No tachycardic or bradycardic dysrhythmias while on cardiac telemetry.   LABS (all labs ordered are listed, but only abnormal results are  displayed) Labs interpreted as -    Labs Reviewed  SALICYLATE LEVEL - Abnormal; Notable for the following components:      Result Value   Salicylate Lvl <7.0 (*)    All other components within normal limits  ACETAMINOPHEN LEVEL - Abnormal; Notable for the following components:   Acetaminophen (Tylenol), Serum <10 (*)    All other components within normal limits  CBC - Abnormal; Notable for the following components:   Hemoglobin 10.8 (*)    HCT 34.3 (*)    MCV 72.5 (*)    MCH 22.8 (*)    All other components within normal limits  URINE DRUG SCREEN, QUALITATIVE ([facility] ONLY) - Abnormal; Notable for the following components:   Cannabinoid 50 Ng, Ur Roland POSITIVE (*)    All other components within normal limits  TSH - Abnormal; Notable for the following components:   TSH 0.208 (*)    All other components within normal limits  COMPREHENSIVE METABOLIC PANEL  ETHANOL  T4, FREE  TROPONIN I (HIGH SENSITIVITY)  TROPONIN I (HIGH SENSITIVITY)    MDM  Patient tearful, expressing concern for thoughts of harm and wanting help.  After evaluation of the patient filled out involuntary commitment paperwork for evaluation by psychiatry.  Given p.o. Ativan for anxiety.  Initial troponin was negative.  Will get a repeat troponin.  Creatinine at baseline.  No significant electrolyte abnormalities.  UDS is positive for cannabis.  No significant anemia.  Serial troponins are negative.  Plan for evaluation by psychiatry.     The patient has been placed in psychiatric observation due to the need to provide a safe environment for the patient while obtaining psychiatric consultation and evaluation, as well as ongoing medical and medication management to treat the patient's condition.  The patient has been placed under full IVC at this time.   PROCEDURES:  Critical Care performed: No  Procedures  Patient's presentation is most consistent with acute presentation with potential threat to life or bodily  function.   MEDICATIONS ORDERED IN ED: Medications  LORazepam (ATIVAN) tablet 1 mg (0 mg Oral Hold 03/02/23 2022)  LORazepam (ATIVAN) tablet 1 mg (1 mg Oral Given 03/02/23 1839)    FINAL CLINICAL IMPRESSION(S) / ED DIAGNOSES   Final diagnoses:  Anxiety  Chest pain, unspecified type     Rx / DC Orders   ED Discharge Orders  None        Note:  This document was prepared using Dragon voice recognition software and may include unintentional dictation errors.   Corena Herter, MD 03/02/23 2315

## 2023-03-02 NOTE — ED Notes (Signed)
Pt was given recliner due to passing in the triage hallway. Pt stated that  she can not sit down and rather lay down.

## 2023-03-02 NOTE — ED Triage Notes (Signed)
Arrived by EMS from downtime [place]. C/o chest pain and sob. Patient reports wanting a mental health evaluation.   Patient asking someone to taking her to cafeteria to eat at this time. Patient made aware we cannot give food in the lobby  EMS vitals: 178/89b/p 79HR 99% RA

## 2023-03-02 NOTE — ED Notes (Signed)
ivc by MD Mumma/psych consult ordered/pending.

## 2023-03-02 NOTE — ED Notes (Addendum)
Pt has become agitated and is yelling. Charge made aware. She is pacing back and forth in triage middle after we told her she needs to sit still. Pt is hitting on the walls.

## 2023-03-02 NOTE — ED Notes (Signed)
Patient refusing to be triaged at this time. Reports she is leaving to go get food in cafeteria. Patient asked to stay and be triaged and then we could get her food once she sees a provider. Patient refused and ambulated out ER

## 2023-03-03 ENCOUNTER — Encounter: Payer: Self-pay | Admitting: Psychiatry

## 2023-03-03 ENCOUNTER — Inpatient Hospital Stay
Admission: RE | Admit: 2023-03-03 | Discharge: 2023-03-10 | DRG: 885 | Disposition: A | Discharge status: Home / Self Care | Payer: MEDICAID | Source: Intra-hospital | Attending: Psychiatry | Admitting: Psychiatry

## 2023-03-03 ENCOUNTER — Other Ambulatory Visit: Payer: Self-pay

## 2023-03-03 DIAGNOSIS — Z5941 Food insecurity: Secondary | ICD-10-CM | POA: Diagnosis not present

## 2023-03-03 DIAGNOSIS — F4310 Post-traumatic stress disorder, unspecified: Secondary | ICD-10-CM | POA: Diagnosis present

## 2023-03-03 DIAGNOSIS — F840 Autistic disorder: Secondary | ICD-10-CM | POA: Diagnosis present

## 2023-03-03 DIAGNOSIS — F332 Major depressive disorder, recurrent severe without psychotic features: Principal | ICD-10-CM | POA: Diagnosis present

## 2023-03-03 DIAGNOSIS — G4700 Insomnia, unspecified: Secondary | ICD-10-CM | POA: Diagnosis present

## 2023-03-03 DIAGNOSIS — F419 Anxiety disorder, unspecified: Secondary | ICD-10-CM | POA: Diagnosis present

## 2023-03-03 DIAGNOSIS — Z5982 Transportation insecurity: Secondary | ICD-10-CM | POA: Diagnosis not present

## 2023-03-03 DIAGNOSIS — F1220 Cannabis dependence, uncomplicated: Secondary | ICD-10-CM | POA: Diagnosis present

## 2023-03-03 DIAGNOSIS — Z880 Allergy status to penicillin: Secondary | ICD-10-CM | POA: Diagnosis not present

## 2023-03-03 DIAGNOSIS — Z8249 Family history of ischemic heart disease and other diseases of the circulatory system: Secondary | ICD-10-CM

## 2023-03-03 DIAGNOSIS — I10 Essential (primary) hypertension: Secondary | ICD-10-CM | POA: Diagnosis present

## 2023-03-03 DIAGNOSIS — Z888 Allergy status to other drugs, medicaments and biological substances status: Secondary | ICD-10-CM | POA: Diagnosis not present

## 2023-03-03 DIAGNOSIS — R079 Chest pain, unspecified: Secondary | ICD-10-CM | POA: Diagnosis not present

## 2023-03-03 DIAGNOSIS — R0789 Other chest pain: Secondary | ICD-10-CM | POA: Diagnosis present

## 2023-03-03 DIAGNOSIS — E039 Hypothyroidism, unspecified: Secondary | ICD-10-CM | POA: Diagnosis present

## 2023-03-03 DIAGNOSIS — I351 Nonrheumatic aortic (valve) insufficiency: Secondary | ICD-10-CM | POA: Diagnosis present

## 2023-03-03 DIAGNOSIS — Z86718 Personal history of other venous thrombosis and embolism: Secondary | ICD-10-CM

## 2023-03-03 DIAGNOSIS — Z79899 Other long term (current) drug therapy: Secondary | ICD-10-CM | POA: Diagnosis not present

## 2023-03-03 DIAGNOSIS — F603 Borderline personality disorder: Secondary | ICD-10-CM | POA: Diagnosis not present

## 2023-03-03 DIAGNOSIS — Z9049 Acquired absence of other specified parts of digestive tract: Secondary | ICD-10-CM | POA: Diagnosis not present

## 2023-03-03 DIAGNOSIS — F329 Major depressive disorder, single episode, unspecified: Principal | ICD-10-CM | POA: Diagnosis present

## 2023-03-03 DIAGNOSIS — Z79890 Hormone replacement therapy (postmenopausal): Secondary | ICD-10-CM | POA: Diagnosis not present

## 2023-03-03 DIAGNOSIS — R45851 Suicidal ideations: Secondary | ICD-10-CM | POA: Diagnosis present

## 2023-03-03 DIAGNOSIS — Z825 Family history of asthma and other chronic lower respiratory diseases: Secondary | ICD-10-CM

## 2023-03-03 DIAGNOSIS — J4489 Other specified chronic obstructive pulmonary disease: Secondary | ICD-10-CM | POA: Diagnosis present

## 2023-03-03 DIAGNOSIS — F17210 Nicotine dependence, cigarettes, uncomplicated: Secondary | ICD-10-CM | POA: Diagnosis present

## 2023-03-03 DIAGNOSIS — Z62810 Personal history of physical and sexual abuse in childhood: Secondary | ICD-10-CM

## 2023-03-03 DIAGNOSIS — Z8572 Personal history of non-Hodgkin lymphomas: Secondary | ICD-10-CM

## 2023-03-03 DIAGNOSIS — Z853 Personal history of malignant neoplasm of breast: Secondary | ICD-10-CM

## 2023-03-03 MED ORDER — HALOPERIDOL LACTATE 5 MG/ML IJ SOLN: 5.0000 mg | Freq: Three times a day (TID) | INTRAMUSCULAR | Status: DC | PRN

## 2023-03-03 MED ORDER — TRAZODONE HCL 50 MG PO TABS
50.0000 mg | ORAL_TABLET | Freq: Every evening | ORAL | Status: DC | PRN
  Administered 2023-03-03 – 2023-03-08 (×5): 50 mg via ORAL
  Filled 2023-03-03 (×5): qty 1

## 2023-03-03 MED ORDER — ALUM & MAG HYDROXIDE-SIMETH 200-200-20 MG/5ML PO SUSP: 30.0000 mL | ORAL | Status: DC | PRN

## 2023-03-03 MED ORDER — HALOPERIDOL 5 MG PO TABS
5.0000 mg | ORAL_TABLET | Freq: Three times a day (TID) | ORAL | Status: DC | PRN
  Filled 2023-03-03: qty 1

## 2023-03-03 MED ORDER — LORAZEPAM 1 MG PO TABS
2.0000 mg | ORAL_TABLET | Freq: Three times a day (TID) | ORAL | Status: DC | PRN
  Administered 2023-03-09: 2 mg via ORAL
  Filled 2023-03-03: qty 2

## 2023-03-03 MED ORDER — MAGNESIUM HYDROXIDE 400 MG/5ML PO SUSP: 30.0000 mL | Freq: Every day | ORAL | Status: DC | PRN

## 2023-03-03 MED ORDER — DIPHENHYDRAMINE HCL 25 MG PO CAPS
50.0000 mg | ORAL_CAPSULE | Freq: Three times a day (TID) | ORAL | Status: DC | PRN
  Administered 2023-03-09: 50 mg via ORAL
  Filled 2023-03-03: qty 2

## 2023-03-03 MED ORDER — LORAZEPAM 2 MG/ML IJ SOLN: 2.0000 mg | Freq: Three times a day (TID) | INTRAMUSCULAR | Status: DC | PRN

## 2023-03-03 MED ORDER — ACETAMINOPHEN 325 MG PO TABS
650.0000 mg | ORAL_TABLET | Freq: Four times a day (QID) | ORAL | Status: DC | PRN
  Administered 2023-03-03 – 2023-03-10 (×6): 650 mg via ORAL
  Filled 2023-03-03 (×6): qty 2

## 2023-03-03 MED ORDER — DIPHENHYDRAMINE HCL 50 MG/ML IJ SOLN: 50.0000 mg | Freq: Three times a day (TID) | INTRAMUSCULAR | Status: DC | PRN
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
## 2023-03-03 NOTE — Group Note (Signed)
Date:  03/03/2023 Time:  9:05 PM  Group Topic/Focus:  Developing a Wellness Toolbox:   The focus of this group is to help patients develop a "wellness toolbox" with skills and strategies to promote recovery upon discharge.    Participation Level:  Did Not Attend  Participation Quality:   Did Not Attend  Affect:   Did Not Attend  Cognitive:  Alert  Insight: None  Engagement in Group:  None  Modes of Intervention:  Exploration  Additional Comments:    Garry Heater 03/03/2023, 9:05 PM

## 2023-03-03 NOTE — Progress Notes (Signed)
1:1 NOTE  Safety round complete. Patient located in bedroom, lying on her right side. Patient is calm and appears to be resting. Chest rise and fall noted. No distress apparent. Q15 mins checks will be continued. Safety sitter at bedside.

## 2023-03-03 NOTE — ED Notes (Signed)
Applesauce provided with cup of water.

## 2023-03-03 NOTE — ED Notes (Signed)
ivc/pending consult.Marland Kitchen

## 2023-03-03 NOTE — ED Notes (Signed)
Pt to nurses station asking for ativan and states she feels very anxious. MD aware. Medication provided.

## 2023-03-03 NOTE — Group Note (Signed)
LCSW Group Therapy Note  Group Date: 03/03/2023 Start Time:  2:00 PM End Time:  3:00 PM   Type of Therapy and Topic:  Group Therapy - Healthy vs Unhealthy Coping Skills  Participation Level:  Did Not Attend   Description of Group The focus of this group was to determine what unhealthy coping techniques typically are used by group members and what healthy coping techniques would be helpful in coping with various problems. Patients were guided in becoming aware of the differences between healthy and unhealthy coping techniques. Patients were asked to identify 2-3 healthy coping skills they would like to learn to use more effectively.  Therapeutic Goals Patients learned that coping is what human beings do all day long to deal with various situations in their lives Patients defined and discussed healthy vs unhealthy coping techniques Patients identified their preferred coping techniques and identified whether these were healthy or unhealthy Patients determined 2-3 healthy coping skills they would like to become more familiar with and use more often. Patients provided support and ideas to each other   Summary of Patient Progress: X  Therapeutic Modalities Cognitive Behavioral Therapy Motivational Interviewing  Elza Rafter, [place] 03/03/2023  4:44 PM

## 2023-03-03 NOTE — ED Notes (Signed)
Pt sitting in dayroom. Nurse Practitioner on unit to speak with pt.

## 2023-03-03 NOTE — Plan of Care (Signed)
D: Pt alert and oriented. Pt reports experiencing anxiety. Pt reports experiencing generalize body pain at this time, waiting for medications to be reconciled. Pt denies experiencing any SI/HI, or AVH at this time.   A: Support and encouragement provided. Frequent verbal contact made. Routine safety checks conducted q15 minutes.   R: Pt verbally contracts for safety at this time. Pt remains safe at this time. Plan of care ongoing.  When asked what brought the pt into the hospital the pt replied do to shortness of breath.   Skin assessment performed and charted upon admission.   Problem: Nutrition: Goal: Adequate nutrition will be maintained Outcome: Progressing   Problem: Coping: Goal: Level of anxiety will decrease Outcome: Not Progressing

## 2023-03-03 NOTE — ED Notes (Signed)
Shower supplies and clean scrubs provided.

## 2023-03-03 NOTE — ED Notes (Signed)
Dinner tray given to pt

## 2023-03-03 NOTE — ED Notes (Signed)
Shower supplies and dirty scrubs correctly disposed of. Breakfast tray provided

## 2023-03-03 NOTE — ED Notes (Signed)
Pt to nurses station asking to use the phone. Patient phone had not been charging appropriately so this nurse called contact "jennifer" with number provided by patient. Victorino Dike asked to feed pt cat and she agreed to sent someone from this patients "team" to the hospital to get her key and will go take care of her cat for her. Pt made aware and verbaized understanding.

## 2023-03-03 NOTE — ED Notes (Addendum)
Report given to admission RN. Pt given dinner tray and prepared for transport.

## 2023-03-03 NOTE — ED Provider Notes (Signed)
Emergency Medicine Observation Re-evaluation Note  Helen Reid is a 62 y.o. female, seen on rounds today.  Pt initially presented to the ED for complaints of Chest Pain and Psychiatric Evaluation  Currently, the patient is resting in bed. No reported issues overnight from nursing team.   Physical Exam  BP 130/62 (BP Location: Left Arm)   Pulse 62   Temp 98.1 F (36.7 C) (Oral)   Resp 16   Ht 5\' 4"  (1.626 m)   SpO2 94%   BMI 27.81 kg/m  Physical Exam General: Resting in bed  ED Course / MDM   I have reviewed the labs and medications over the past 24 hours.  Labs with mild anemia, no critical derangements.  TSH slightly low, but normal free T4.  Negative troponin x 2.  Received p.o. Ativan x 1 yesterday.  Plan  Current plan is for dispo per psychiatry.    Trinna Post, MD 03/03/23 [phone]

## 2023-03-03 NOTE — Progress Notes (Signed)
   03/03/23 2100  Psych Admission Type (Psych Patients Only)  Admission Status Involuntary  Psychosocial Assessment  Patient Complaints Anxiety;Worrying;Sleep disturbance  Eye Contact Fair  Facial Expression Anxious  Affect Anxious  Interaction Childlike  Motor Activity Restless  Appearance/Hygiene Disheveled  Behavior Characteristics Anxious;Restless  Mood Anxious;Pleasant  Thought Process  Coherency WDL  Content Delusions  Delusions Paranoid  Perception WDL  Hallucination None reported or observed  Judgment Impaired  Confusion None  Danger to Self  Current suicidal ideation? Denies  Danger to Others  Danger to Others None reported or observed

## 2023-03-03 NOTE — Progress Notes (Signed)
Patient provided with a bedside commode

## 2023-03-03 NOTE — Progress Notes (Signed)
Pt very agitated and not cooperative to be assess at this time.  Pt to be see later on by day shift provider when she calms down.
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
## 2023-03-03 NOTE — ED Notes (Signed)
Pt went downstairs to [facility] MED

## 2023-03-04 ENCOUNTER — Inpatient Hospital Stay: Payer: MEDICAID

## 2023-03-04 DIAGNOSIS — F840 Autistic disorder: Secondary | ICD-10-CM

## 2023-03-04 DIAGNOSIS — F1220 Cannabis dependence, uncomplicated: Secondary | ICD-10-CM | POA: Diagnosis not present

## 2023-03-04 DIAGNOSIS — F4310 Post-traumatic stress disorder, unspecified: Secondary | ICD-10-CM | POA: Diagnosis not present

## 2023-03-04 DIAGNOSIS — F332 Major depressive disorder, recurrent severe without psychotic features: Secondary | ICD-10-CM

## 2023-03-04 LAB — D-DIMER, QUANTITATIVE: D-Dimer, Quant: 2.34 ug{FEU}/mL — ABNORMAL HIGH (ref 0.00–0.50)

## 2023-03-04 MED ORDER — IOHEXOL 350 MG/ML SOLN
100.0000 mL | Freq: Once | INTRAVENOUS | Status: AC | PRN
  Administered 2023-03-04: 100 mL via INTRAVENOUS

## 2023-03-04 MED ORDER — BUSPIRONE HCL 5 MG PO TABS
5.0000 mg | ORAL_TABLET | Freq: Two times a day (BID) | ORAL | Status: DC
  Administered 2023-03-04 – 2023-03-10 (×13): 5 mg via ORAL
  Filled 2023-03-04 (×14): qty 1

## 2023-03-04 MED ORDER — HYDROXYZINE HCL 25 MG PO TABS
25.0000 mg | ORAL_TABLET | Freq: Four times a day (QID) | ORAL | Status: DC | PRN
  Administered 2023-03-05 – 2023-03-09 (×2): 25 mg via ORAL
  Filled 2023-03-04 (×2): qty 1

## 2023-03-04 MED ORDER — ATORVASTATIN CALCIUM 10 MG PO TABS
20.0000 mg | ORAL_TABLET | Freq: Every day | ORAL | Status: DC
  Administered 2023-03-04 – 2023-03-10 (×7): 20 mg via ORAL
  Filled 2023-03-04 (×7): qty 2

## 2023-03-04 MED ORDER — ALBUTEROL SULFATE HFA 108 (90 BASE) MCG/ACT IN AERS
2.0000 | INHALATION_SPRAY | Freq: Four times a day (QID) | RESPIRATORY_TRACT | Status: DC | PRN
  Administered 2023-03-04 – 2023-03-05 (×2): 2 via RESPIRATORY_TRACT
  Filled 2023-03-04: qty 6.7

## 2023-03-04 MED ORDER — MIRTAZAPINE 15 MG PO TABS
7.5000 mg | ORAL_TABLET | Freq: Every day | ORAL | Status: DC
  Administered 2023-03-04 – 2023-03-05 (×2): 7.5 mg via ORAL
  Filled 2023-03-04 (×2): qty 1

## 2023-03-04 MED ORDER — HYDROXYZINE HCL 25 MG PO TABS
25.0000 mg | ORAL_TABLET | Freq: Once | ORAL | Status: AC
  Administered 2023-03-04: 25 mg via ORAL
  Filled 2023-03-04: qty 1

## 2023-03-04 MED ORDER — LEVOTHYROXINE SODIUM 137 MCG PO TABS
137.0000 ug | ORAL_TABLET | Freq: Every day | ORAL | Status: DC
  Administered 2023-03-05 – 2023-03-10 (×6): 137 ug via ORAL
  Filled 2023-03-04 (×6): qty 1
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
## 2023-03-04 NOTE — Progress Notes (Signed)
Patient returned to unit. 

## 2023-03-04 NOTE — Plan of Care (Signed)
D: Pt alert and oriented. Pt reports experiencing anxiety/depression at this time. Pt reports experiencing 10/10 neck pain at this time, prn medication offered and refused. Pt denies experiencing any SI/HI, or AVH at this time.   A: Scheduled medications administered to pt, per MD orders. Support and encouragement provided. Frequent verbal contact made. Routine safety checks conducted q15 minutes.   R: No adverse drug reactions noted. Pt verbally contracts for safety at this time. Pt compliant with medications and treatment plan. Pt interacts well with others on the unit. Pt remains safe at this time. Plan of care ongoing.  Problem: Nutrition: Goal: Adequate nutrition will be maintained Outcome: Progressing   Problem: Coping: Goal: Level of anxiety will decrease Outcome: Not Progressing

## 2023-03-04 NOTE — Progress Notes (Signed)
1:1 NOTE   Safety round complete. Patient located in bedroom, lying supine. Patient is calm and appears to be resting. Chest rise and fall noted. No distress apparent. Q15 mins checks will be continued. Safety sitter at bedside.

## 2023-03-04 NOTE — Progress Notes (Signed)
Patient transported to CT via wheelchair with tech and security.

## 2023-03-04 NOTE — Progress Notes (Signed)
Hourly 1:1 Rounding  0730: Pt in bedroom calm and composed with sitter present  0830: Pt in dayroom calm and composed with sitter present  0930: Pt in bedroom calm and composed with sitter present  1030: Pt in bedroom calm and composed with sitter present  1130: Pt in dayroom calm and composed with sitter present  1230: Pt in bedroom calm and composed with sitter present  1330: Pt in bedroom calm and composed with sitter present  1430: Pt in dayroom calm and composed with sitter present  1530:  Pt in dayroom calm and composed with sitter present  1630: Pt in dayroom calm and composed with sitter present  1730:  Pt in bedroom calm and composed with sitter present  1830: Pt upstair at radiology with sitter present  1900: Pt in dayroom calm and composed with sitter present

## 2023-03-04 NOTE — Progress Notes (Signed)
IV team at bedside 

## 2023-03-04 NOTE — BH IP Treatment Plan (Signed)
Interdisciplinary Treatment and Diagnostic Plan Update  03/04/2023 Time of Session: 10:09 AM  Helen Reid MRN: [id number]  Principal Diagnosis: MDD (major depressive disorder)  Secondary Diagnoses: Principal Problem:   MDD (major depressive disorder) Active Problems:   MDD (major depressive disorder), recurrent severe, without psychosis (HCC)   Autism spectrum disorder   Moderate tetrahydrocannabinol (THC) dependence (HCC)   Current Medications:  Current Facility-Administered Medications  Medication Dose Route Frequency Provider Last Rate Last Admin   acetaminophen (TYLENOL) tablet 650 mg  650 mg Oral Q6H PRN Penn, Cranston Neighbor, NP   650 mg at 03/03/23 2115   albuterol (VENTOLIN HFA) 108 (90 Base) MCG/ACT inhaler 2 puff  2 puff Inhalation Q6H PRN Lewanda Rife, MD   2 puff at 03/04/23 1224   alum & mag hydroxide-simeth (MAALOX/MYLANTA) 200-200-20 MG/5ML suspension 30 mL  30 mL Oral Q4H PRN Penn, Cranston Neighbor, NP       atorvastatin (LIPITOR) tablet 20 mg  20 mg Oral Daily Lewanda Rife, MD   20 mg at 03/04/23 1202   busPIRone (BUSPAR) tablet 5 mg  5 mg Oral BID Lewanda Rife, MD   5 mg at 03/04/23 1402   diphenhydrAMINE (BENADRYL) capsule 50 mg  50 mg Oral TID PRN Mcneil Sober, NP       Or   diphenhydrAMINE (BENADRYL) injection 50 mg  50 mg Intramuscular TID PRN Penn, Cicely, NP       haloperidol (HALDOL) tablet 5 mg  5 mg Oral TID PRN Mcneil Sober, NP       Or   haloperidol lactate (HALDOL) injection 5 mg  5 mg Intramuscular TID PRN Mcneil Sober, NP       hydrOXYzine (ATARAX) tablet 25 mg  25 mg Oral Q6H PRN Lewanda Rife, MD       [START ON 03/05/2023] levothyroxine (SYNTHROID) tablet 137 mcg  137 mcg Oral QAC breakfast Lewanda Rife, MD       LORazepam (ATIVAN) tablet 2 mg  2 mg Oral TID PRN Mcneil Sober, NP       Or   LORazepam (ATIVAN) injection 2 mg  2 mg Intramuscular TID PRN Penn, Cranston Neighbor, NP       magnesium hydroxide (MILK OF MAGNESIA) suspension 30 mL  30 mL Oral  Daily PRN Penn, Cicely, NP       mirtazapine (REMERON) tablet 7.5 mg  7.5 mg Oral QHS Lewanda Rife, MD       traZODone (DESYREL) tablet 50 mg  50 mg Oral QHS PRN Mcneil Sober, NP   50 mg at 03/03/23 2116   PTA Medications: Medications Prior to Admission  Medication Sig Dispense Refill Last Dose   albuterol (VENTOLIN HFA) 108 (90 Base) MCG/ACT inhaler Inhale 2 puffs into the lungs every 6 (six) hours as needed for wheezing or shortness of breath. 6.7 g 2    atorvastatin (LIPITOR) 20 MG tablet Take 1 tablet by mouth daily.      citalopram (CELEXA) 10 MG tablet Take 10 mg by mouth daily. (Patient not taking: Reported on 02/25/2022)      EPINEPHrine (EPIPEN 2-PAK) 0.3 mg/0.3 mL IJ SOAJ injection Inject 0.3 mg into the muscle as needed for anaphylaxis. 2 each 2    fluticasone-salmeterol (ADVAIR) 100-50 MCG/ACT AEPB Inhale 1 puff into the lungs 2 (two) times daily. (Patient not taking: Reported on 03/02/2023) 60 each 3    levothyroxine (SYNTHROID) 100 MCG tablet Take 1 tablet (100 mcg total) by mouth daily. (Patient not taking: Reported on 03/02/2023) 30 tablet 2  levothyroxine (SYNTHROID) 137 MCG tablet Take 137 mcg by mouth daily before breakfast.      QUEtiapine (SEROQUEL) 100 MG tablet Take 100 mg by mouth at bedtime. (Patient not taking: Reported on 02/08/2022)       Patient Stressors:    Patient Strengths:    Treatment Modalities: Medication Management, Group therapy, Case management,  1 to 1 session with clinician, Psychoeducation, Recreational therapy.   Physician Treatment Plan for Primary Diagnosis: MDD (major depressive disorder) Long Term Goal(s): Improvement in symptoms so as ready for discharge   Short Term Goals: Ability to identify changes in lifestyle to reduce recurrence of condition will improve Ability to verbalize feelings will improve Ability to disclose and discuss suicidal ideas Ability to demonstrate self-control will improve Ability to identify and develop  effective coping behaviors will improve Ability to maintain clinical measurements within normal limits will improve Compliance with prescribed medications will improve Ability to identify triggers associated with substance abuse/mental health issues will improve  Medication Management: Evaluate patient's response, side effects, and tolerance of medication regimen.  Therapeutic Interventions: 1 to 1 sessions, Unit Group sessions and Medication administration.  Evaluation of Outcomes: Progressing  Physician Treatment Plan for Secondary Diagnosis: Principal Problem:   MDD (major depressive disorder) Active Problems:   MDD (major depressive disorder), recurrent severe, without psychosis (HCC)   Autism spectrum disorder   Moderate tetrahydrocannabinol (THC) dependence (HCC)  Long Term Goal(s): Improvement in symptoms so as ready for discharge   Short Term Goals: Ability to identify changes in lifestyle to reduce recurrence of condition will improve Ability to verbalize feelings will improve Ability to disclose and discuss suicidal ideas Ability to demonstrate self-control will improve Ability to identify and develop effective coping behaviors will improve Ability to maintain clinical measurements within normal limits will improve Compliance with prescribed medications will improve Ability to identify triggers associated with substance abuse/mental health issues will improve     Medication Management: Evaluate patient's response, side effects, and tolerance of medication regimen.  Therapeutic Interventions: 1 to 1 sessions, Unit Group sessions and Medication administration.  Evaluation of Outcomes: Progressing   RN Treatment Plan for Primary Diagnosis: MDD (major depressive disorder) Long Term Goal(s): Knowledge of disease and therapeutic regimen to maintain health will improve  Short Term Goals: Ability to remain free from injury will improve, Ability to verbalize frustration and  anger appropriately will improve, Ability to demonstrate self-control, Ability to participate in decision making will improve, Ability to verbalize feelings will improve, Ability to disclose and discuss suicidal ideas, Ability to identify and develop effective coping behaviors will improve, and Compliance with prescribed medications will improve  Medication Management: RN will administer medications as ordered by provider, will assess and evaluate patient's response and provide education to patient for prescribed medication. RN will report any adverse and/or side effects to prescribing provider.  Therapeutic Interventions: 1 on 1 counseling sessions, Psychoeducation, Medication administration, Evaluate responses to treatment, Monitor vital signs and CBGs as ordered, Perform/monitor CIWA, COWS, AIMS and Fall Risk screenings as ordered, Perform wound care treatments as ordered.  Evaluation of Outcomes: Progressing   LCSW Treatment Plan for Primary Diagnosis: MDD (major depressive disorder) Long Term Goal(s): Safe transition to appropriate next level of care at discharge, Engage patient in therapeutic group addressing interpersonal concerns.  Short Term Goals: Engage patient in aftercare planning with referrals and resources, Increase social support, Increase ability to appropriately verbalize feelings, Increase emotional regulation, Facilitate acceptance of mental health diagnosis and concerns, Facilitate patient progression through  stages of change regarding substance use diagnoses and concerns, Identify triggers associated with mental health/substance abuse issues, and Increase skills for wellness and recovery  Therapeutic Interventions: Assess for all discharge needs, 1 to 1 time with Social worker, Explore available resources and support systems, Assess for adequacy in community support network, Educate family and significant other(s) on suicide prevention, Complete Psychosocial Assessment,  Interpersonal group therapy.  Evaluation of Outcomes: Progressing   Progress in Treatment: Attending groups: Yes. and No. Participating in groups: Yes. and No. Taking medication as prescribed: Yes. Toleration medication: Yes. Family/Significant other contact made: No, will contact:  CSW will contact if given permission  Patient understands diagnosis: Yes. Discussing patient identified problems/goals with staff: Yes. Medical problems stabilized or resolved: Yes. Denies suicidal/homicidal ideation: No. Issues/concerns per patient self-inventory: No. Other: None   New problem(s) identified: No, Describe:  None identified   New Short Term/Long Term Goal(s): elimination of symptoms of psychosis, medication management for mood stabilization; elimination of SI thoughts; development of comprehensive mental wellness plan.   Patient Goals:  " I want to be stabilized, I want to get better, I want to feel better"   Discharge Plan or Barriers: CSW will assist with appropriate discharge planning   Reason for Continuation of Hospitalization: Anxiety Depression Homicidal ideation Medication stabilization Suicidal ideation  Estimated Length of Stay: 1 to 7 days   Last 3 [place] Suicide Severity Risk Score: Flowsheet Row Admission (Current) from 03/03/2023 in [facility] ED from 03/02/2023 in [facility] Department at [facility] ED from 01/21/2022 in [facility] Emergency Department at [facility]  C-SSRS RISK CATEGORY Moderate Risk Moderate Risk No Risk       Last PHQ 2/9 Scores:    09/15/2021    2:53 PM 09/22/2015    8:11 AM 09/02/2015    4:03 PM  Depression screen PHQ 2/9  Decreased Interest 3 0 0  Down, Depressed, Hopeless 3 0 0  PHQ - 2 Score 6 0 0  Altered sleeping 3    Tired, decreased energy 3    Change in appetite 3    Feeling bad or failure about yourself  2    Trouble concentrating 3    Moving slowly or fidgety/restless 3     Suicidal thoughts 3    PHQ-9 Score 26    Difficult doing work/chores Extremely dIfficult      Scribe for Treatment Team: Elza Rafter, Theresia Majors 03/04/2023 2:14 PM

## 2023-03-04 NOTE — Plan of Care (Signed)
[facility] Tonsillectomy/Adenoidectomy, Postoperative PEDS care plan entered in error.
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
## 2023-03-04 NOTE — Progress Notes (Signed)
1:1 NOTE  Safety round complete. Patient located in bedroom, lying on her right side. Patient is calm and appears to be resting. Chest rise and fall noted. No distress apparent. Q15 mins checks will be continued. Safety sitter at bedside.
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
## 2023-03-04 NOTE — Progress Notes (Signed)
   03/04/23 2100  Psych Admission Type (Psych Patients Only)  Admission Status Involuntary  Psychosocial Assessment  Patient Complaints Anxiety;Depression  Eye Contact Fair  Facial Expression Anxious  Affect Anxious  Speech Logical/coherent  Interaction Childlike  Motor Activity Unsteady;Fidgety  Appearance/Hygiene In scrubs;Disheveled  Behavior Characteristics Cooperative;Anxious  Mood Anxious;Pleasant  Thought Process  Coherency WDL  Content Delusions  Delusions Religious  Perception WDL  Hallucination None reported or observed  Judgment Impaired  Confusion None  Danger to Self  Current suicidal ideation? Denies  Danger to Others  Danger to Others None reported or observed

## 2023-03-04 NOTE — Progress Notes (Signed)
   03/04/23 0601  15 Minute Checks  Location Bedroom  Visual Appearance Calm  Behavior Sleeping  Sleep (Behavioral Health Patients Only)  Calculate sleep? (Click Yes once per 24 hr at 0600 safety check) Yes  Documented sleep last 24 hours 6.25

## 2023-03-04 NOTE — Group Note (Signed)
Date:  03/04/2023 Time:  9:15 PM  Group Topic/Focus:  Overcoming Stress:   The focus of this group is to define stress and help patients assess their triggers.    Participation Level:  Active  Participation Quality:  Appropriate  Affect:  Appropriate  Cognitive:  Appropriate  Insight: Appropriate  Engagement in Group:  Engaged  Modes of Intervention:  Education  Additional Comments:    Garry Heater 03/04/2023, 9:15 PM

## 2023-03-04 NOTE — Group Note (Signed)
Date:  03/04/2023 Time:  9:10 AM  Group Topic/Focus:  Self Care:   The focus of this group is to help patients understand the importance of self-care in order to improve or restore emotional, physical, spiritual, interpersonal, and financial health.    Participation Level:  Active  Participation Quality:  Appropriate and Attentive  Affect:  Appropriate  Cognitive:  Alert  Insight: Appropriate, Good, and Improving  Engagement in Group:  Engaged, Improving, and Supportive  Modes of Intervention:  Discussion  Additional Comments:     Alexis Frock 03/04/2023, 9:10 AM

## 2023-03-04 NOTE — Progress Notes (Signed)
Discussed with patient about holding her medications until procedure is finished, patient verbalized understanding and agreed.

## 2023-03-05 DIAGNOSIS — F332 Major depressive disorder, recurrent severe without psychotic features: Secondary | ICD-10-CM | POA: Diagnosis not present

## 2023-03-05 DIAGNOSIS — F1220 Cannabis dependence, uncomplicated: Secondary | ICD-10-CM | POA: Diagnosis not present

## 2023-03-05 DIAGNOSIS — F840 Autistic disorder: Secondary | ICD-10-CM | POA: Diagnosis not present

## 2023-03-05 DIAGNOSIS — F4310 Post-traumatic stress disorder, unspecified: Secondary | ICD-10-CM | POA: Diagnosis not present

## 2023-03-05 NOTE — Progress Notes (Signed)
[facility] MD Progress Note  03/05/2023 2:52 PM Helen Reid  MRN:  [id number]   Helen Reid is a 62 y.o. female patient admitted secondary to suicide thoughts and depression.  Patient states "I want a mental health check, I'm going through a lot".   Subjective: Chart reviewed, case discussed with staff, patient seen during rounds.  Patient continues to report depressed and irritable mood.  Patient said that she was annoyed at one of her peers who have been "talking too much".  Patient was encouraged to use her coping skills and may be stay  from the peers who annoys her.  Patient uses drawing as her coping skills.  Patient had some questions regarding medicine which were answered.  Patient denies thoughts of harming herself.  Patient is more alert and animated as compared to yesterday.  She was pleasant to talk to. Patient has been attending groups.  She was encouraged to continue attending group and work on coping strategies and a safe discharge plan.  Patient denies auditory visual hallucinations.  Principal Problem: MDD (major depressive disorder) Diagnosis: Principal Problem:   MDD (major depressive disorder) Active Problems:   MDD (major depressive disorder), recurrent severe, without psychosis (HCC)   Autism spectrum disorder   Moderate tetrahydrocannabinol (THC) dependence (HCC)    Past Psychiatric History: Patient reports past history of PTSD, depression, and anxiety.  Patient reports 1 past psychiatric hospitalization at [facility] in 2017   Past Medical History:  Past Medical History:  Diagnosis Date   Anxiety    Cancer (HCC)    lymphoma, gallbladder, breast   Depression    Hypertension    IBS (irritable bowel syndrome)    PTSD (post-traumatic stress disorder)    Vitamin D deficiency 09/27/2015    Past Surgical History:  Procedure Laterality Date   BREAST SURGERY     CHOLECYSTECTOMY     Family History:  Family History  Problem Relation Age of Onset   Heart disease Mother     Hypertension Mother    Asthma Mother    Heart disease Father     Social History:  Social History   Substance and Sexual Activity  Alcohol Use Not Currently   Alcohol/week: 1.0 standard drink of alcohol   Types: 1 Glasses of wine per week     Social History   Substance and Sexual Activity  Drug Use Yes   Types: Marijuana, "Crack" cocaine    Social History   Socioeconomic History   Marital status: Single    Spouse name: Not on file   Number of children: 4   Years of education: Not on file   Highest education level: Not on file  Occupational History   Not on file  Tobacco Use   Smoking status: Some Days    Current packs/day: 0.50    Average packs/day: 0.5 packs/day for 58.8 years (29.4 ttl pk-yrs)    Types: Cigarettes    Start date: 1966   Smokeless tobacco: Never  Substance and Sexual Activity   Alcohol use: Not Currently    Alcohol/week: 1.0 standard drink of alcohol    Types: 1 Glasses of wine per week   Drug use: Yes    Types: Marijuana, "Crack" cocaine   Sexual activity: Not Currently    Partners: Male  Other Topics Concern   Not on file  Social History Narrative   Not on file   Social Determinants of Health   Financial Resource Strain: Not on file  Food Insecurity: Food Insecurity Present (  03/03/2023)   Hunger Vital Sign    Worried About Running Out of Food in the Last Year: Sometimes true    Ran Out of Food in the Last Year: Sometimes true  Transportation Needs: Unmet Transportation Needs (03/03/2023)   PRAPARE - Administrator, Civil Service (Medical): Yes    Lack of Transportation (Non-Medical): Yes  Physical Activity: Not on file  Stress: Not on file  Social Connections: Not on file                            Sleep: Fair  Appetite:  Fair  Current Medications: Current Facility-Administered Medications  Medication Dose Route Frequency Provider Last Rate Last Admin   acetaminophen (TYLENOL) tablet 650 mg  650 mg  Oral Q6H PRN Penn, Cranston Neighbor, NP   650 mg at 03/03/23 2115   albuterol (VENTOLIN HFA) 108 (90 Base) MCG/ACT inhaler 2 puff  2 puff Inhalation Q6H PRN Lewanda Rife, MD   2 puff at 03/05/23 0942   alum & mag hydroxide-simeth (MAALOX/MYLANTA) 200-200-20 MG/5ML suspension 30 mL  30 mL Oral Q4H PRN Penn, Cranston Neighbor, NP       atorvastatin (LIPITOR) tablet 20 mg  20 mg Oral Daily Lewanda Rife, MD   20 mg at 03/05/23 0942   busPIRone (BUSPAR) tablet 5 mg  5 mg Oral BID Lewanda Rife, MD   5 mg at 03/05/23 8657   diphenhydrAMINE (BENADRYL) capsule 50 mg  50 mg Oral TID PRN Mcneil Sober, NP       Or   diphenhydrAMINE (BENADRYL) injection 50 mg  50 mg Intramuscular TID PRN Penn, Cicely, NP       haloperidol (HALDOL) tablet 5 mg  5 mg Oral TID PRN Mcneil Sober, NP       Or   haloperidol lactate (HALDOL) injection 5 mg  5 mg Intramuscular TID PRN Mcneil Sober, NP       hydrOXYzine (ATARAX) tablet 25 mg  25 mg Oral Q6H PRN Lewanda Rife, MD   25 mg at 03/05/23 8469   levothyroxine (SYNTHROID) tablet 137 mcg  137 mcg Oral QAC breakfast Lewanda Rife, MD   137 mcg at 03/05/23 0606   LORazepam (ATIVAN) tablet 2 mg  2 mg Oral TID PRN Mcneil Sober, NP       Or   LORazepam (ATIVAN) injection 2 mg  2 mg Intramuscular TID PRN Mcneil Sober, NP       magnesium hydroxide (MILK OF MAGNESIA) suspension 30 mL  30 mL Oral Daily PRN Penn, Cicely, NP       mirtazapine (REMERON) tablet 7.5 mg  7.5 mg Oral QHS Lewanda Rife, MD   7.5 mg at 03/04/23 2218   traZODone (DESYREL) tablet 50 mg  50 mg Oral QHS PRN Mcneil Sober, NP   50 mg at 03/03/23 2116    Lab Results:  Results for orders placed or performed during the hospital encounter of 03/03/23 (from the past 48 hour(s))  D-dimer, quantitative     Status: Abnormal   Collection Time: 03/04/23  3:25 PM  Result Value Ref Range   D-Dimer, Quant 2.34 (H) 0.00 - 0.50 ug/mL-FEU    Comment: (NOTE) At the manufacturer cut-off value of 0.5 g/mL FEU, this assay  has a negative predictive value of 95-100%.This assay is intended for use in conjunction with a clinical pretest probability (PTP) assessment model to exclude pulmonary embolism (PE) and deep venous thrombosis (DVT) in outpatients suspected  of PE or DVT. Results should be correlated with clinical presentation. Performed at [facility], [address] [place], [place] [address]     Blood Alcohol level:  Lab Results  Component Value Date   [facility] <10 03/02/2023   ETH <5 07/07/2015    Metabolic Disorder Labs: Lab Results  Component Value Date   HGBA1C 5.9 (H) 09/15/2021   No results found for: "PROLACTIN" Lab Results  Component Value Date   CHOL 390 (H) 09/15/2021   TRIG 86 09/15/2021   HDL 106 09/15/2021   CHOLHDL 3.7 09/15/2021   VLDL 16 05/27/2015   LDLCALC 272 (H) 09/15/2021   LDLCALC 154 (H) 05/27/2015    Musculoskeletal: Strength & Muscle Tone: within normal limits Gait & Station:  In Wheel chair Patient leans: N/A   Psychiatric Specialty Exam:   Presentation  General Appearance:  Appropriate for Environment   Eye Contact: Fair   Speech: Clear and Coherent   Speech Volume: Normal   Handedness:No data recorded   Mood and Affect  Mood: Depressed; Anxious; Irritable   Affect: Congruent; Restricted; Tearful     Thought Process  Thought Processes: Coherent   Descriptions of Associations:Intact   Orientation:Full (Time, Place and Person)   Thought Content:Abstract Reasoning; Rumination   History of Schizophrenia/Schizoaffective disorder:No   Duration of Psychotic Symptoms:NA Hallucinations:Hallucinations: None   Ideas of Reference:None   Suicidal Thoughts:Suicidal Thoughts: Yes, Active SI Active Intent and/or Plan: Without Intent; Without Plan   Homicidal Thoughts: No   Sensorium  Memory: Immediate Fair; Recent Fair   Judgment: Impaired   Insight: Fair     Chartered certified accountant: Fair   Attention  Span: Fair   Recall: [employer] of Knowledge: Fair   Language: Fair     Psychomotor Activity  Psychomotor Activity: Psychomotor Activity: Decreased     Assets  Assets: Manufacturing systems engineer; Desire for Improvement; Resilience     Sleep  Sleep: Sleep: Poor       Physical Exam: Physical Exam Constitutional:      Appearance: Normal appearance.  HENT:     Head: Normocephalic and atraumatic.     Nose: Nose normal.  Eyes:     Pupils: Pupils are equal, round, and reactive to light.  Cardiovascular:     Rate and Rhythm: Normal rate and regular rhythm.  Pulmonary:     Effort: Pulmonary effort is normal.  Neurological:     General: No focal deficit present.     Mental Status: She is alert and oriented to person, place, and time.      Review of Systems  Constitutional:  Negative for chills and fever.  HENT:  Negative for hearing loss, sinus pain and sore throat.   Eyes:  Negative for blurred vision and double vision.  Respiratory:  Negative for cough and shortness of breath.   Cardiovascular:  Negative for chest pain and palpitations.  Gastrointestinal:  Negative for nausea and vomiting.  Musculoskeletal:  Positive for joint pain.  Neurological:  Negative for dizziness and speech change.    Blood pressure (!) 111/52, pulse (!) 57, temperature (!) 97.3 F (36.3 C), resp. rate 18, height 5\' 4"  (1.626 m), weight 71.4 kg, SpO2 100%. Body mass index is 27.03 kg/m.   Treatment Plan Summary: Daily contact with patient to assess and evaluate symptoms and progress in treatment and Medication management  Patient is admitted to locked unit under safety precautions Continue on Remeron 7.5 mg at bedtime to help with depression  and insomnia Continue on BuSpar 5 mg by mouth twice daily to help with anxiety Will continue to monitor for PTSD related symptoms, will consider adding prazosin if patient continues to have nightmares and hyperarousal symptoms Patient was  encouraged to attend group and work on coping strategies Will consult social worker to get collateral and help with a safe discharge plan  Lewanda Rife, MD

## 2023-03-05 NOTE — Plan of Care (Signed)
D: Pt alert and oriented. Pt reports experiencing anxiety/depression at this time. Pt reports experiencing 10/10 chest pain at this time however, refuses pain management medication offered. Pt denies experiencing any SI/HI, or AVH at this time. Pt did make a comment about being annoyed by a peer on the unit. Pt educated to use coping skills and recommended to keep distance. Pt educated to notify staff if she encounter difficulties with peers so that staff can be involved in resolving the issue. Pt was satisfied with this revolutionary answer.   A: Scheduled medications administered to pt, per MD orders. Support and encouragement provided. Frequent verbal contact made. Routine safety checks conducted q15 minutes.   R: No adverse drug reactions noted. Pt verbally contracts for safety at this time. Pt compliant with medications and treatment plan. Pt interacts minimally with others on the unit. Pt remains safe at this time. Plan of care ongoing.  Pt attended both groups. Pt observed as active in the milieu. Pt observed dragging her walker behind her this morning. Pt observed walking with and without walker throughout day. Pt educated and encourage to use walker to help with stable/steady walking to prevent falls.   Problem: Activity: Goal: Risk for activity intolerance will decrease Outcome: Progressing   Problem: Coping: Goal: Level of anxiety will decrease Outcome: Not Progressing

## 2023-03-05 NOTE — Plan of Care (Signed)
  Problem: Nutrition: Goal: Adequate nutrition will be maintained Outcome: Progressing   Problem: Coping: Goal: Level of anxiety will decrease Outcome: Progressing  Patient complaint with medication interacting well with Peers and Staff. Required redirection when walking Patient tries to run most of the time. Ambulate with unsteady gait. All fall protocol in place. Denies SI/HI/A/VH at present and verbally contracts for safety. Q 15 minutes safety checks ongoing Patient remains safe.

## 2023-03-05 NOTE — Group Note (Signed)
Date:  03/05/2023 Time:  4:33 PM  Group Topic/Focus:  Activity Group:  The focus of the group is to encourage patients to step outside in the courtyard to get some fresh air and some exercise for the benefit of their mental health.    Participation Level:  Active  Participation Quality:  Appropriate  Affect:  Appropriate  Cognitive:  Appropriate  Insight: Appropriate  Engagement in Group:  Engaged  Modes of Intervention:  Activity  Additional Comments:    Mary Sella Shamiyah Ngu 03/05/2023, 4:33 PM
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
## 2023-03-05 NOTE — Progress Notes (Signed)
1:1 NOTE  Safety round complete. Patient located in bedroom, lying on her right side. Patient is calm and appears to be resting. Chest rise and fall noted. No distress apparent. Q15 mins checks will be continued. Safety sitter at bedside.

## 2023-03-05 NOTE — Group Note (Signed)
Date:  03/05/2023 Time:  11:49 PM  Group Topic/Focus:  Developing a Wellness Toolbox:   The focus of this group is to help patients develop a "wellness toolbox" with skills and strategies to promote recovery upon discharge.    Participation Level:  Active  Participation Quality:  Appropriate  Affect:  Appropriate  Cognitive:  Appropriate  Insight: Appropriate  Engagement in Group:  Engaged  Modes of Intervention:  Discussion  Additional Comments:    Garry Heater 03/05/2023, 11:49 PM

## 2023-03-05 NOTE — Plan of Care (Signed)
[facility] Tonsillectomy/Adenoidectomy, Postoperative PEDS care plan entered in error.

## 2023-03-05 NOTE — Group Note (Signed)
Date:  03/05/2023 Time:  6:32 PM  Group Topic/Focus:  Coping With Mental Health Crisis:   The purpose of this group is to help patients identify strategies for coping with mental health crisis.  Group discusses possible causes of crisis and ways to manage them effectively.    Participation Level:  Active  Participation Quality:  Appropriate  Affect:  Appropriate  Cognitive:  Appropriate  Insight: Appropriate  Engagement in Group:  Engaged  Modes of Intervention:  Activity  Additional Comments:    Helen Reid Donnalynn Wheeless 03/05/2023, 6:32 PM

## 2023-03-05 NOTE — Progress Notes (Signed)
   03/05/23 0557  15 Minute Checks  Location Bedroom  Visual Appearance Calm  Behavior Sleeping  Sleep (Behavioral Health Patients Only)  Calculate sleep? (Click Yes once per 24 hr at 0600 safety check) Yes  Documented sleep last 24 hours 7.25

## 2023-03-05 NOTE — Progress Notes (Signed)
1:1 NOTE   Safety round complete. Patient located in dayroom,  Patient is calm. No distress apparent. Q15 mins checks will be continued. Safety sitter at bedside.

## 2023-03-05 NOTE — Progress Notes (Signed)
1:1 Hourly Rounding  0730: Pt in dayroom, calm and composed with sitter present  0830: Pt in dayroom, calm and composed with sitter present  0930: Pt in dayroom, calm and composed with sitter present  1030: Pt in quiet room with consulting hospitalist, pt calm and composed with sitter present  1130: Pt in dayroom, calm and composed with sitter present  1230: Pt in dayroom, calm and composed with sitter present  1330: Pt in dayroom, calm and composed with sitter present  1430: Pt in dayroom, calm and composed with sitter present  1530: Pt in courtyard, calm and composed with sitter present  1630: Pt in dayroom, calm and composed with sitter present  1730: Pt in shower, calm and composed with sitter present  1730: 1:1 discontinued
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
## 2023-03-06 ENCOUNTER — Inpatient Hospital Stay: Admit: 2023-03-06 | Payer: MEDICAID

## 2023-03-06 DIAGNOSIS — F4310 Post-traumatic stress disorder, unspecified: Secondary | ICD-10-CM | POA: Diagnosis not present

## 2023-03-06 DIAGNOSIS — F332 Major depressive disorder, recurrent severe without psychotic features: Secondary | ICD-10-CM | POA: Diagnosis not present

## 2023-03-06 DIAGNOSIS — F1220 Cannabis dependence, uncomplicated: Secondary | ICD-10-CM | POA: Diagnosis not present

## 2023-03-06 DIAGNOSIS — F840 Autistic disorder: Secondary | ICD-10-CM | POA: Diagnosis not present

## 2023-03-06 MED ORDER — MIRTAZAPINE 15 MG PO TABS
15.0000 mg | ORAL_TABLET | Freq: Every day | ORAL | Status: DC
  Administered 2023-03-06 – 2023-03-09 (×4): 15 mg via ORAL
  Filled 2023-03-06 (×5): qty 1

## 2023-03-06 NOTE — Progress Notes (Signed)
   03/06/23 0819  Psych Admission Type (Psych Patients Only)  Admission Status Involuntary  Psychosocial Assessment  Patient Complaints None  Eye Contact Fair  Facial Expression Animated  Affect Anxious;Irritable  Speech Loud;Logical/coherent  Interaction Childlike  Motor Activity Other (Comment) (wnl)  Appearance/Hygiene In scrubs  Behavior Characteristics Cooperative  Mood Irritable  Thought Process  Coherency WDL  Content WDL  Delusions None reported or observed  Perception WDL  Hallucination None reported or observed  Judgment Impaired  Confusion None  Danger to Self  Current suicidal ideation? Denies  Danger to Others  Danger to Others None reported or observed

## 2023-03-06 NOTE — Progress Notes (Signed)
D: Patient alert and oriented times 3. She denies SI, HI, AVH. She denies pain. No other concerns noted at this time.   A: Pt provided support and encouragement throughout the day. Scheduled medications given as prescribed. Takes medications whole without issue. Patient has anger outbursts when things dont go away and has to be redirected.    R: Pt remains safe on the unit with Q15 min safety checks in place. Will continue to monitor for changes.

## 2023-03-06 NOTE — Group Note (Signed)
Date:  03/06/2023 Time:  4:57 PM  Group Topic/Focus:  Activity Group:  The focus of the group is to promote activity for the patients and encourage them to go outside to the courtyard and get some fresh air and some exercise.    Participation Level:  Active  Participation Quality:  Appropriate  Affect:  Appropriate  Cognitive:  Appropriate  Insight: Appropriate  Engagement in Group:  Engaged  Modes of Intervention:  Activity  Additional Comments:    Helen Reid 03/06/2023, 4:57 PM

## 2023-03-06 NOTE — Evaluation (Signed)
Physical Therapy Evaluation Patient Details Name: Helen Reid MRN: [id number] DOB: 1960-08-17 Today's Date: 03/06/2023  History of Present Illness  Naliya Farish is a 62yoF who comes [facility] on 03/02/23 via EMS with CP, SOB, SI. Pt admitted IVC to geropsych unit. PMH: GAD, depression, PTSD, HTN, lymphoma.  Clinical Impression  Pt in dayroom coloring/drawing, appears to be in good spirits, pleasant, social, interactive with Chartered loss adjuster. Pt gives brief description of her CC- soreness in hips, low back, ribs and sternal area. Reports ad lib AMB on unit today, previous day, reports to be avoiding use of DME in preparation for her DC mobility needs. Pt says she went outside to courtyard yesterday and was mobile and active and really enjoyed it, but might have exceeded her typical movement tolerance as today she feels somewhat sore as a result. Pt is agreeable that OPPT services would be beneficial to her after DC, reports she can take medical transportation to these appointments. She would like to feel stronger and less painful in her legs/back, trunk. No additional PT services warranted at this time.       If plan is discharge home, recommend the following: Assist for transportation   Can travel by private vehicle        Equipment Recommendations None recommended by PT  Recommendations for Other Services       Functional Status Assessment Patient has not had a recent decline in their functional status     Precautions / Restrictions Precautions Precautions: Fall Restrictions Weight Bearing Restrictions: No      Mobility  Bed Mobility                    Transfers Overall transfer level: Independent Equipment used: None                    Ambulation/Gait Ambulation/Gait assistance: Independent Gait Distance (Feet):  (AMB freely on BHU, no device; AMB in session back and forth  between tables in dayroom) Assistive device: None Gait Pattern/deviations: WFL(Within  Functional Limits)          Stairs            Wheelchair Mobility     Tilt Bed    Modified Rankin (Stroke Patients Only)       Balance                                             Pertinent Vitals/Pain Pain Assessment Pain Assessment:  (generalized soreness in hips, sternal area,)    Home Living Family/patient expects to be discharged to:: Private residence Living Arrangements: Alone   Type of Home: Apartment                  Prior Function Prior Level of Function : Independent/Modified Independent             Mobility Comments: takes transport to apointments, or a ride from her aide       Extremity/Trunk Assessment                Communication      Cognition Arousal: Alert Behavior During Therapy: WFL for tasks assessed/performed Overall Cognitive Status: Within Functional Limits for tasks assessed  General Comments      Exercises     Assessment/Plan    PT Assessment All further PT needs can be met in the next venue of care  PT Problem List Decreased strength;Decreased mobility       PT Treatment Interventions      PT Goals (Current goals can be found in the Care Plan section)  Acute Rehab PT Goals PT Goal Formulation: All assessment and education complete, DC therapy    Frequency       Co-evaluation               AM-PAC PT "6 Clicks" Mobility  Outcome Measure Help needed turning from your back to your side while in a flat bed without using bedrails?: None Help needed moving from lying on your back to sitting on the side of a flat bed without using bedrails?: None Help needed moving to and from a bed to a chair (including a wheelchair)?: None Help needed standing up from a chair using your arms (e.g., wheelchair or bedside chair)?: None Help needed to walk in hospital room?: None Help needed climbing 3-5 steps with a railing? :  None 6 Click Score: 24    End of Session   Activity Tolerance: Patient tolerated treatment well   Nurse Communication: Mobility status PT Visit Diagnosis: Muscle weakness (generalized) (M62.81);Difficulty in walking, not elsewhere classified (R26.2)    Time: 0981-1914 PT Time Calculation (min) (ACUTE ONLY): 8 min   Charges:   PT Evaluation $PT Eval Low Complexity: 1 Low   PT General Charges $$ ACUTE PT VISIT: 1 Visit       9:37 AM, 03/06/23 Rosamaria Lints, PT, DPT Physical Therapist - [facility]  [phone] (ASCOM)    Courtland Reas C 03/06/2023, 9:34 AM

## 2023-03-06 NOTE — Group Note (Signed)
LCSW Group Therapy Note   Group Date: 03/06/2023 Start Time: 1240 End Time: 1330   Type of Therapy and Topic:  Group Therapy: Self- Care  Participation Level:  Active    Summary of Patient Progress:  The patient attended group. Patient proved open to input from peers and feedback from [facility]. The patient was respectful of peers and participated throughout the entire session. The patient participated during today's icebreaker questions. The patient stated anything with crafts or creativity is a way for her to practice self-care. The patient stated that her goal is to go back to school.     Marshell Levan, LCSWA 03/06/2023  3:26 PM

## 2023-03-06 NOTE — Progress Notes (Signed)
[facility] MD Progress Note  03/06/2023 1:00 PM Helen Reid  MRN:  [id number]   Helen Reid is a 62 y.o. female patient admitted secondary to suicide thoughts and depression.  Patient states "I want a mental health check, I'm going through a lot".   Subjective: Chart reviewed, case discussed with staff, patient seen during rounds.  Patient reports she feels somewhat better today.  She talked about her current outpatient provider.  Patient said that she does not want to follow with [facility].  Patient is requesting to see a different out patient provider.  Patient will discuss this with social worker on the unit.  Today patient denies thoughts of harming herself.  Patient is more alert and animated.  She was pleasant to talk to. Patient has been attending groups.  She was encouraged to continue attending groups and work on coping strategies and a safe discharge plan.  Patient denies auditory or visual hallucinations.  Principal Problem: MDD (major depressive disorder) Diagnosis: Principal Problem:   MDD (major depressive disorder) Active Problems:   MDD (major depressive disorder), recurrent severe, without psychosis (HCC)   Autism spectrum disorder   Moderate tetrahydrocannabinol (THC) dependence (HCC)    Past Psychiatric History: Patient reports past history of PTSD, depression, and anxiety.  Patient reports 1 past psychiatric hospitalization at [facility] in 2017   Past Medical History:  Past Medical History:  Diagnosis Date   Anxiety    Cancer (HCC)    lymphoma, gallbladder, breast   Depression    Hypertension    IBS (irritable bowel syndrome)    PTSD (post-traumatic stress disorder)    Vitamin D deficiency 09/27/2015    Past Surgical History:  Procedure Laterality Date   BREAST SURGERY     CHOLECYSTECTOMY     Family History:  Family History  Problem Relation Age of Onset   Heart disease Mother    Hypertension Mother    Asthma Mother    Heart disease Father     Social  History:  Social History   Substance and Sexual Activity  Alcohol Use Not Currently   Alcohol/week: 1.0 standard drink of alcohol   Types: 1 Glasses of wine per week     Social History   Substance and Sexual Activity  Drug Use Yes   Types: Marijuana, "Crack" cocaine    Social History   Socioeconomic History   Marital status: Single    Spouse name: Not on file   Number of children: 4   Years of education: Not on file   Highest education level: Not on file  Occupational History   Not on file  Tobacco Use   Smoking status: Some Days    Current packs/day: 0.50    Average packs/day: 0.5 packs/day for 58.8 years (29.4 ttl pk-yrs)    Types: Cigarettes    Start date: 1966   Smokeless tobacco: Never  Substance and Sexual Activity   Alcohol use: Not Currently    Alcohol/week: 1.0 standard drink of alcohol    Types: 1 Glasses of wine per week   Drug use: Yes    Types: Marijuana, "Crack" cocaine   Sexual activity: Not Currently    Partners: Male  Other Topics Concern   Not on file  Social History Narrative   Not on file   Social Determinants of Health   Financial Resource Strain: Not on file  Food Insecurity: Food Insecurity Present (03/03/2023)   Hunger Vital Sign    Worried About Programme researcher, broadcasting/film/video in  the Last Year: Sometimes true    Ran Out of Food in the Last Year: Sometimes true  Transportation Needs: Unmet Transportation Needs (03/03/2023)   PRAPARE - Administrator, Civil Service (Medical): Yes    Lack of Transportation (Non-Medical): Yes  Physical Activity: Not on file  Stress: Not on file  Social Connections: Not on file                            Sleep: Fair  Appetite:  Fair  Current Medications: Current Facility-Administered Medications  Medication Dose Route Frequency Provider Last Rate Last Admin   acetaminophen (TYLENOL) tablet 650 mg  650 mg Oral Q6H PRN Penn, Cranston Neighbor, NP   650 mg at 03/05/23 2127   albuterol (VENTOLIN HFA)  108 (90 Base) MCG/ACT inhaler 2 puff  2 puff Inhalation Q6H PRN Lewanda Rife, MD   2 puff at 03/05/23 0942   alum & mag hydroxide-simeth (MAALOX/MYLANTA) 200-200-20 MG/5ML suspension 30 mL  30 mL Oral Q4H PRN Penn, Cranston Neighbor, NP       atorvastatin (LIPITOR) tablet 20 mg  20 mg Oral Daily Lewanda Rife, MD   20 mg at 03/06/23 1005   busPIRone (BUSPAR) tablet 5 mg  5 mg Oral BID Lewanda Rife, MD   5 mg at 03/06/23 1005   diphenhydrAMINE (BENADRYL) capsule 50 mg  50 mg Oral TID PRN Mcneil Sober, NP       Or   diphenhydrAMINE (BENADRYL) injection 50 mg  50 mg Intramuscular TID PRN Penn, Cicely, NP       haloperidol (HALDOL) tablet 5 mg  5 mg Oral TID PRN Mcneil Sober, NP       Or   haloperidol lactate (HALDOL) injection 5 mg  5 mg Intramuscular TID PRN Mcneil Sober, NP       hydrOXYzine (ATARAX) tablet 25 mg  25 mg Oral Q6H PRN Lewanda Rife, MD   25 mg at 03/05/23 4166   levothyroxine (SYNTHROID) tablet 137 mcg  137 mcg Oral QAC breakfast Lewanda Rife, MD   137 mcg at 03/06/23 0630   LORazepam (ATIVAN) tablet 2 mg  2 mg Oral TID PRN Mcneil Sober, NP       Or   LORazepam (ATIVAN) injection 2 mg  2 mg Intramuscular TID PRN Mcneil Sober, NP       magnesium hydroxide (MILK OF MAGNESIA) suspension 30 mL  30 mL Oral Daily PRN Penn, Cicely, NP       mirtazapine (REMERON) tablet 7.5 mg  7.5 mg Oral QHS Lewanda Rife, MD   7.5 mg at 03/05/23 2127   traZODone (DESYREL) tablet 50 mg  50 mg Oral QHS PRN Mcneil Sober, NP   50 mg at 03/05/23 2127    Lab Results:  Results for orders placed or performed during the hospital encounter of 03/03/23 (from the past 48 hour(s))  D-dimer, quantitative     Status: Abnormal   Collection Time: 03/04/23  3:25 PM  Result Value Ref Range   D-Dimer, Quant 2.34 (H) 0.00 - 0.50 ug/mL-FEU    Comment: (NOTE) At the manufacturer cut-off value of 0.5 g/mL FEU, this assay has a negative predictive value of 95-100%.This assay is intended for use in  conjunction with a clinical pretest probability (PTP) assessment model to exclude pulmonary embolism (PE) and deep venous thrombosis (DVT) in outpatients suspected of PE or DVT. Results should be correlated with clinical presentation. Performed at [facility],  [address], [place], [place] [address]     Blood Alcohol level:  Lab Results  Component Value Date   ETH <10 03/02/2023   ETH <5 07/07/2015    Metabolic Disorder Labs: Lab Results  Component Value Date   HGBA1C 5.9 (H) 09/15/2021   No results found for: "PROLACTIN" Lab Results  Component Value Date   CHOL 390 (H) 09/15/2021   TRIG 86 09/15/2021   HDL 106 09/15/2021   CHOLHDL 3.7 09/15/2021   VLDL 16 05/27/2015   LDLCALC 272 (H) 09/15/2021   LDLCALC 154 (H) 05/27/2015    Musculoskeletal: Strength & Muscle Tone: within normal limits Gait & Station: Steady, patient walking on the unit without a walker Patient leans: N/A   Psychiatric Specialty Exam:   Presentation  General Appearance:  Appropriate for Environment   Eye Contact: Fair   Speech: Clear and Coherent   Speech Volume: Normal   Handedness:No data recorded   Mood and Affect  Mood: " Better"   Affect: Stable and more animated     Thought Process  Thought Processes: Coherent   Descriptions of Associations:Intact   Orientation:Full (Time, Place and Person)   Thought Content:Abstract Reasoning; Rumination   History of Schizophrenia/Schizoaffective disorder:No   Duration of Psychotic Symptoms:NA Hallucinations:Hallucinations: None   Ideas of Reference:None   Suicidal Thoughts: Denies today   Homicidal Thoughts: No   Sensorium  Memory: Immediate Fair; Recent Fair   Judgment: Improving   Insight: Fair     Chartered certified accountant: Fair   Attention Span: Fair   Recall: [employer] of Knowledge: Fair   Language: Fair     Psychomotor Activity  Psychomotor Activity: Psychomotor  Activity: Normal   Assets  Assets: Communication Skills; Desire for Improvement; Resilience     Sleep  Sleep: Sleep: Poor       Physical Exam: Physical Exam Constitutional:      Appearance: Normal appearance.  HENT:     Head: Normocephalic and atraumatic.     Nose: Nose normal.  Eyes:     Pupils: Pupils are equal, round, and reactive to light.  Cardiovascular:     Rate and Rhythm: Normal rate and regular rhythm.  Pulmonary:     Effort: Pulmonary effort is normal.  Neurological:     General: No focal deficit present.     Mental Status: She is alert and oriented to person, place, and time.      Review of Systems  Constitutional:  Negative for chills and fever.  HENT:  Negative for hearing loss, sinus pain and sore throat.   Eyes:  Negative for blurred vision and double vision.  Respiratory:  Negative for cough and shortness of breath.   Cardiovascular:  Negative for chest pain and palpitations.  Gastrointestinal:  Negative for nausea and vomiting.  Musculoskeletal:  Positive for joint pain.  Neurological:  Negative for dizziness and speech change.    Blood pressure 138/71, pulse 71, temperature (!) 97.3 F (36.3 C), resp. rate 18, height 5\' 4"  (1.626 m), weight 71.4 kg, SpO2 100%. Body mass index is 27.03 kg/m.   Treatment Plan Summary: Daily contact with patient to assess and evaluate symptoms and progress in treatment and Medication management  Patient is admitted to locked unit under safety precautions Increase the dose of Remeron to 15 mg at bedtime to help with depression  Continue on BuSpar 5 mg by mouth twice daily to help with anxiety Will continue to monitor for PTSD related symptoms,  will consider adding prazosin if patient continues to have nightmares and hyperarousal symptoms Patient was encouraged to attend group and work on coping strategies Will consult social worker to get collateral and help with a safe discharge plan  Lewanda Rife, MD

## 2023-03-07 ENCOUNTER — Inpatient Hospital Stay
Admit: 2023-03-07 | Discharge: 2023-03-07 | Disposition: A | Discharge status: Home / Self Care | Payer: MEDICAID | Attending: Internal Medicine

## 2023-03-07 ENCOUNTER — Inpatient Hospital Stay (HOSPITAL_COMMUNITY)
Admit: 2023-03-07 | Discharge: 2023-03-07 | Disposition: A | Discharge status: Home / Self Care | Payer: MEDICAID | Attending: Internal Medicine

## 2023-03-07 DIAGNOSIS — R079 Chest pain, unspecified: Secondary | ICD-10-CM

## 2023-03-07 DIAGNOSIS — F332 Major depressive disorder, recurrent severe without psychotic features: Secondary | ICD-10-CM | POA: Diagnosis not present

## 2023-03-07 DIAGNOSIS — F4310 Post-traumatic stress disorder, unspecified: Secondary | ICD-10-CM | POA: Diagnosis not present

## 2023-03-07 DIAGNOSIS — F840 Autistic disorder: Secondary | ICD-10-CM | POA: Diagnosis not present

## 2023-03-07 DIAGNOSIS — F1220 Cannabis dependence, uncomplicated: Secondary | ICD-10-CM | POA: Diagnosis not present

## 2023-03-07 LAB — ECHOCARDIOGRAM COMPLETE
AR max vel: 2.69 cm2
AV Area VTI: 2.96 cm2
AV Area mean vel: 2.76 cm2
AV Mean grad: 7 mm[Hg]
AV Peak grad: 13.3 mm[Hg]
Ao pk vel: 1.83 m/s
Area-P 1/2: 2.62 cm2
Height: 64 in
MV VTI: 3.45 cm2
S' Lateral: 2.1 cm
Weight: 2520 [oz_av]

## 2023-03-07 NOTE — Progress Notes (Signed)
   03/07/23 0557  15 Minute Checks  Location Bedroom  Visual Appearance Calm  Behavior Sleeping  Sleep (Behavioral Health Patients Only)  Calculate sleep? (Click Yes once per 24 hr at 0600 safety check) Yes  Documented sleep last 24 hours 6

## 2023-03-07 NOTE — Group Note (Signed)
Recreation Therapy Group Note   Group Topic:Emotion Expression  Group Date: 03/07/2023 Start Time: 1400 End Time: 1455 Facilitators: Rosina Lowenstein, LRT, CTRS Location: Courtyard  Group Description: Music Reminisce. LRT encouraged patients to think of their favorite song(s) that reminded them of a positive memory or time in their life. LRT encouraged patient to talk about that memory aloud to the group. LRT played the song through a speaker for all to hear. LRT and patients discussed how thinking of a positive memory or time in their life can be used as a coping skill in everyday life post discharge.   Goal Area(s) Addressed: Patient will increase verbal communication by conversing with peers. Patient will contribute to group discussion with minimal prompting. Patient will reminisce a positive memory or moment in their life.    Affect/Mood: N/A   Participation Level: Did not attend    Clinical Observations/Individualized Feedback: Helen Reid did not attend group.  Plan: Continue to engage patient in RT group sessions 2-3x/week.   Rosina Lowenstein, LRT, CTRS 03/07/2023 3:15 PM

## 2023-03-07 NOTE — Progress Notes (Signed)
*  PRELIMINARY RESULTS* Echocardiogram 2D Echocardiogram has been performed.  Cristela Blue 03/07/2023, 8:48 AM

## 2023-03-07 NOTE — [facility] Suicide Risk Assessment (Signed)
[facility] INPATIENT:  Family/Significant Other Suicide Prevention Education  Suicide Prevention Education:  Family/Significant Other Refusal to Support Patient after Discharge:  Suicide Prevention Education Not Provided:  With written consent of the patient, attempts were made to provide Suicide Prevention Education to [employer], brother, [phone].This person indicates he/she will not be responsible for the patient after discharge.  Elza Rafter 03/07/2023,2:21 PM

## 2023-03-07 NOTE — Progress Notes (Signed)
   03/07/23 2200  Psych Admission Type (Psych Patients Only)  Admission Status Involuntary  Psychosocial Assessment  Patient Complaints None  Eye Contact Fair  Facial Expression Animated  Affect Euphoric  Speech Logical/coherent  Interaction Childlike  Motor Activity Shuffling  Appearance/Hygiene In scrubs  Behavior Characteristics Cooperative  Mood Pleasant  Thought Process  Coherency WDL  Content WDL  Delusions None reported or observed  Perception WDL  Hallucination None reported or observed  Judgment Impaired  Confusion None  Danger to Self  Current suicidal ideation? Denies  Danger to Others  Danger to Others None reported or observed

## 2023-03-07 NOTE — Progress Notes (Signed)
   03/06/23 2300  Psych Admission Type (Psych Patients Only)  Admission Status Involuntary  Psychosocial Assessment  Patient Complaints None  Eye Contact Fair  Facial Expression Animated  Affect Anxious  Speech Logical/coherent  Interaction Childlike  Motor Activity Slow  Appearance/Hygiene In scrubs  Behavior Characteristics Cooperative  Mood Pleasant  Thought Process  Coherency WDL  Content WDL  Delusions None reported or observed  Perception WDL  Hallucination None reported or observed  Judgment Impaired  Confusion None  Danger to Self  Current suicidal ideation? Denies  Danger to Others  Danger to Others None reported or observed

## 2023-03-07 NOTE — Progress Notes (Signed)
[facility] MD Progress Note  03/07/2023  Helen Reid  MRN:  [id number]   Helen Reid is a 62 y.o. female patient admitted secondary to suicide thoughts and depression.  Patient states "I want a mental health check, I'm going through a lot".   Subjective: Chart reviewed, case discussed with staff in multidisciplinary meeting, patient seen during rounds.  P patient reports she is doing good today.  Patient said that her sleep has improved.  Patient continue to say that she does not want to follow with [facility].  Patient is requesting to see a different out patient provider.  This was discussed with social worker today.  Today patient denies thoughts of harming herself.  Patient is more alert and animated.  She was pleasant to talk to. Patient has been attending groups.  She was encouraged to continue attending groups and work on coping strategies and a safe discharge plan.  Patient denies auditory or visual hallucinations.  Principal Problem: MDD (major depressive disorder) Diagnosis: Principal Problem:   MDD (major depressive disorder) Active Problems:   MDD (major depressive disorder), recurrent severe, without psychosis (HCC)   Autism spectrum disorder   Moderate tetrahydrocannabinol (THC) dependence (HCC)    Past Psychiatric History: Patient reports past history of PTSD, depression, and anxiety.  Patient reports 1 past psychiatric hospitalization at [facility] in 2017   Past Medical History:  Past Medical History:  Diagnosis Date   Anxiety    Cancer (HCC)    lymphoma, gallbladder, breast   Depression    Hypertension    IBS (irritable bowel syndrome)    PTSD (post-traumatic stress disorder)    Vitamin D deficiency 09/27/2015    Past Surgical History:  Procedure Laterality Date   BREAST SURGERY     CHOLECYSTECTOMY     Family History:  Family History  Problem Relation Age of Onset   Heart disease Mother    Hypertension Mother    Asthma Mother    Heart disease Father      Social History:  Social History   Substance and Sexual Activity  Alcohol Use Not Currently   Alcohol/week: 1.0 standard drink of alcohol   Types: 1 Glasses of wine per week     Social History   Substance and Sexual Activity  Drug Use Yes   Types: Marijuana, "Crack" cocaine    Social History   Socioeconomic History   Marital status: Single    Spouse name: Not on file   Number of children: 4   Years of education: Not on file   Highest education level: Not on file  Occupational History   Not on file  Tobacco Use   Smoking status: Some Days    Current packs/day: 0.50    Average packs/day: 0.5 packs/day for 58.8 years (29.4 ttl pk-yrs)    Types: Cigarettes    Start date: 1966   Smokeless tobacco: Never  Substance and Sexual Activity   Alcohol use: Not Currently    Alcohol/week: 1.0 standard drink of alcohol    Types: 1 Glasses of wine per week   Drug use: Yes    Types: Marijuana, "Crack" cocaine   Sexual activity: Not Currently    Partners: Male  Other Topics Concern   Not on file  Social History Narrative   Not on file   Social Determinants of Health   Financial Resource Strain: Not on file  Food Insecurity: Food Insecurity Present (03/03/2023)   Hunger Vital Sign    Worried About Running Out of  Food in the Last Year: Sometimes true    Ran Out of Food in the Last Year: Sometimes true  Transportation Needs: Unmet Transportation Needs (03/03/2023)   PRAPARE - Administrator, Civil Service (Medical): Yes    Lack of Transportation (Non-Medical): Yes  Physical Activity: Not on file  Stress: Not on file  Social Connections: Not on file                            Sleep: Fair  Appetite:  Fair  Current Medications: Current Facility-Administered Medications  Medication Dose Route Frequency Provider Last Rate Last Admin   acetaminophen (TYLENOL) tablet 650 mg  650 mg Oral Q6H PRN Penn, Cranston Neighbor, NP   650 mg at 03/07/23 0626   albuterol  (VENTOLIN HFA) 108 (90 Base) MCG/ACT inhaler 2 puff  2 puff Inhalation Q6H PRN Lewanda Rife, MD   2 puff at 03/05/23 0942   alum & mag hydroxide-simeth (MAALOX/MYLANTA) 200-200-20 MG/5ML suspension 30 mL  30 mL Oral Q4H PRN Penn, Cranston Neighbor, NP       atorvastatin (LIPITOR) tablet 20 mg  20 mg Oral Daily Lewanda Rife, MD   20 mg at 03/07/23 0943   busPIRone (BUSPAR) tablet 5 mg  5 mg Oral BID Lewanda Rife, MD   5 mg at 03/07/23 0944   diphenhydrAMINE (BENADRYL) capsule 50 mg  50 mg Oral TID PRN Mcneil Sober, NP       Or   diphenhydrAMINE (BENADRYL) injection 50 mg  50 mg Intramuscular TID PRN Penn, Cranston Neighbor, NP       haloperidol (HALDOL) tablet 5 mg  5 mg Oral TID PRN Mcneil Sober, NP       Or   haloperidol lactate (HALDOL) injection 5 mg  5 mg Intramuscular TID PRN Mcneil Sober, NP       hydrOXYzine (ATARAX) tablet 25 mg  25 mg Oral Q6H PRN Lewanda Rife, MD   25 mg at 03/05/23 1610   levothyroxine (SYNTHROID) tablet 137 mcg  137 mcg Oral QAC breakfast Lewanda Rife, MD   137 mcg at 03/07/23 0615   LORazepam (ATIVAN) tablet 2 mg  2 mg Oral TID PRN Mcneil Sober, NP       Or   LORazepam (ATIVAN) injection 2 mg  2 mg Intramuscular TID PRN Penn, Cranston Neighbor, NP       magnesium hydroxide (MILK OF MAGNESIA) suspension 30 mL  30 mL Oral Daily PRN Penn, Cicely, NP       mirtazapine (REMERON) tablet 15 mg  15 mg Oral QHS Lewanda Rife, MD   15 mg at 03/06/23 2119   traZODone (DESYREL) tablet 50 mg  50 mg Oral QHS PRN Mcneil Sober, NP   50 mg at 03/06/23 2119    Lab Results:  No results found for this or any previous visit (from the past 48 hour(s)).   Blood Alcohol level:  Lab Results  Component Value Date   ETH <10 03/02/2023   ETH <5 07/07/2015    Metabolic Disorder Labs: Lab Results  Component Value Date   HGBA1C 5.9 (H) 09/15/2021   No results found for: "PROLACTIN" Lab Results  Component Value Date   CHOL 390 (H) 09/15/2021   TRIG 86 09/15/2021   HDL 106  09/15/2021   CHOLHDL 3.7 09/15/2021   VLDL 16 05/27/2015   LDLCALC 272 (H) 09/15/2021   LDLCALC 154 (H) 05/27/2015    Musculoskeletal: Strength & Muscle Tone: within  normal limits Gait & Station: Steady, patient walking on the unit without a walker Patient leans: N/A   Psychiatric Specialty Exam:   Presentation  General Appearance:  Appropriate for Environment   Eye Contact: Fair   Speech: Clear and Coherent   Speech Volume: Normal   Handedness:No data recorded   Mood and Affect  Mood: " Good"   Affect: Stable and more animated     Thought Process  Thought Processes: Coherent   Descriptions of Associations:Intact   Orientation:Full (Time, Place and Person)   Thought Content:Abstract Reasoning; Rumination   History of Schizophrenia/Schizoaffective disorder:No   Duration of Psychotic Symptoms:NA Hallucinations:Hallucinations: None   Ideas of Reference:None   Suicidal Thoughts: Denies today   Homicidal Thoughts: No   Sensorium  Memory: Immediate Fair; Recent Fair   Judgment: Improving   Insight: Fair     Chartered certified accountant: Fair   Attention Span: Fair   Recall: [employer] of Knowledge: Fair   Language: Fair     Psychomotor Activity  Psychomotor Activity: Psychomotor Activity: Normal   Assets  Assets: Manufacturing systems engineer; Desire for Improvement; Resilience     Sleep  Sleep: Sleep: Poor       Physical Exam: Physical Exam Constitutional:      Appearance: Normal appearance.  HENT:     Head: Normocephalic and atraumatic.     Nose: Nose normal.  Eyes:     Pupils: Pupils are equal, round, and reactive to light.  Cardiovascular:     Rate and Rhythm: Normal rate and regular rhythm.  Pulmonary:     Effort: Pulmonary effort is normal.  Neurological:     General: No focal deficit present.     Mental Status: She is alert and oriented to person, place, and time.      Review of Systems   Constitutional:  Negative for chills and fever.  HENT:  Negative for hearing loss, sinus pain and sore throat.   Eyes:  Negative for blurred vision and double vision.  Respiratory:  Negative for cough and shortness of breath.   Cardiovascular:  Negative for chest pain and palpitations.  Gastrointestinal:  Negative for nausea and vomiting.  Musculoskeletal:  Positive for joint pain.  Neurological:  Negative for dizziness and speech change.    Blood pressure 117/72, pulse 69, temperature 98.2 F (36.8 C), resp. rate 18, height 5\' 4"  (1.626 m), weight 71.4 kg, SpO2 100%. Body mass index is 27.03 kg/m.   Treatment Plan Summary: Daily contact with patient to assess and evaluate symptoms and progress in treatment and Medication management  Patient is admitted to locked unit under safety precautions Increase the dose of Remeron to 15 mg at bedtime to help with depression  Continue on BuSpar 5 mg by mouth twice daily to help with anxiety Will continue to monitor for PTSD related symptoms, will consider adding prazosin if patient continues to have nightmares and hyperarousal symptoms Patient was encouraged to attend group and work on coping strategies  Social worker consulted to get collateral and help with a safe discharge plan ELOS 1-2 days  Lewanda Rife, MD

## 2023-03-07 NOTE — Group Note (Signed)
Date:  03/07/2023 Time:  3:48 AM  Group Topic/Focus:  Recovery Goals:   The focus of this group is to identify appropriate goals for recovery and establish a plan to achieve them.    Participation Level:  Active  Participation Quality:  Appropriate  Affect:  Appropriate  Cognitive:  Appropriate  Insight: Appropriate  Engagement in Group:  Engaged  Modes of Intervention:  Discussion  Additional Comments:    Maeola Harman 03/07/2023, 3:48 AM

## 2023-03-07 NOTE — [facility] Counselor (Signed)
CSW contacted  Helen Reid, pt's brother at  [phone]   CSW inquired about pt's mental health history and pt's access to weapons.   Helen Reid was not able to answer CSW's questions in a straightforward manner.  Helen Reid reports,   "My sister is crazier than a bed bug, she is truly nuts, but one thing I can say about my sister she is not stupid"  When she CSW inquired what Helen Reid meant by that he states,    "Once you understand what nuts means, then my sister can fulfill her purpose"   CSW inquired about whether pt was a danger to herself or others.   Helen Reid states  "I know how she came in here, I know how she's living now, We pray that she don't trust ship"  CSW asked about pt's supports here in state, Helen Reid reports she has family, she just doesn't know them.   CSW asked if he was able to talk to pt. Helen Reid reports she has called and they have talked.   CSW thanked [facility] for his time.   Helen Reid responds, "Next time you call, it's Mr.Hammonds"   Reynaldo Minium, MSW, [place] 03/07/2023 1:55 PM

## 2023-03-07 NOTE — Plan of Care (Signed)

## 2023-03-07 NOTE — Progress Notes (Signed)
   03/07/23 0723  Psych Admission Type (Psych Patients Only)  Admission Status Involuntary  Psychosocial Assessment  Patient Complaints None  Eye Contact Fair  Facial Expression Animated  Affect Anxious;Irritable  Speech Loud;Logical/coherent  Interaction Childlike  Motor Activity Other (Comment) (wnl)  Appearance/Hygiene In scrubs  Behavior Characteristics Cooperative;Impulsive  Mood Pleasant  Thought Process  Coherency WDL  Content WDL  Delusions None reported or observed  Perception WDL  Hallucination None reported or observed  Judgment Impaired  Confusion None  Danger to Self  Current suicidal ideation? Denies  Danger to Others  Danger to Others None reported or observed

## 2023-03-07 NOTE — Group Note (Signed)
Date:  03/07/2023 Time:  10:45 PM  Group Topic/Focus:  Wrap-Up Group:   The focus of this group is to help patients review their daily goal of treatment and discuss progress on daily workbooks.    Participation Level:  Active  Participation Quality:  Appropriate, Intrusive, and Monopolizing  Affect:  Appropriate and Excited  Cognitive:  Alert  Insight: Appropriate  Engagement in Group:  Engaged and Monopolizing  Modes of Intervention:  Discussion and Support  Additional Comments:     Maglione,Reuel Lamadrid E 03/07/2023, 10:45 PM

## 2023-03-08 DIAGNOSIS — F332 Major depressive disorder, recurrent severe without psychotic features: Secondary | ICD-10-CM | POA: Diagnosis not present

## 2023-03-08 MED ORDER — METHOCARBAMOL 500 MG PO TABS
500.0000 mg | ORAL_TABLET | Freq: Four times a day (QID) | ORAL | Status: DC | PRN
  Administered 2023-03-08 – 2023-03-10 (×5): 500 mg via ORAL
  Filled 2023-03-08 (×5): qty 1

## 2023-03-08 NOTE — Group Note (Signed)
Date:  03/08/2023 Time:  6:34 PM  Group Topic/Focus:  Healthy Communication:   The focus of this group is to discuss communication, barriers to communication, as well as healthy ways to communicate with others.    Participation Level:  Active  Participation Quality:  Appropriate  Affect:  Appropriate  Cognitive:  Appropriate  Insight: Appropriate  Engagement in Group:  Developing/Improving and Engaged  Modes of Intervention:  Activity and Discussion  Additional Comments:    Rosaura Carpenter 03/08/2023, 6:34 PM

## 2023-03-08 NOTE — Progress Notes (Signed)
Patient reports of neck stiffness and pain 9/10. Patients reports not being able to turn her neck.

## 2023-03-08 NOTE — Group Note (Signed)
[facility] LCSW Group Therapy Note   Group Date: 03/08/2023 Start Time: 1315 End Time: 1400   Type of Therapy/Topic:  Group Therapy:  Emotion Regulation  Participation Level:  Active   Mood:  Description of Group:    The purpose of this group is to assist patients in learning to regulate negative emotions and experience positive emotions. Patients will be guided to discuss ways in which they have been vulnerable to their negative emotions. These vulnerabilities will be juxtaposed with experiences of positive emotions or situations, and patients challenged to use positive emotions to combat negative ones. Special emphasis will be placed on coping with negative emotions in conflict situations, and patients will process healthy conflict resolution skills.  Therapeutic Goals: Patient will identify two positive emotions or experiences to reflect on in order to balance out negative emotions:  Patient will label two or more emotions that they find the most difficult to experience:  Patient will be able to demonstrate positive conflict resolution skills through discussion or role plays:   Summary of Patient Progress:   Pt participated throughout group and was appropriate throughout. Pt was slightly agitated but easily redirectable.      Therapeutic Modalities:   Cognitive Behavioral Therapy Feelings Identification Dialectical Behavioral Therapy   Elza Rafter, LCSWA

## 2023-03-08 NOTE — Progress Notes (Signed)
   03/08/23 2000  Psych Admission Type (Psych Patients Only)  Admission Status Voluntary  Psychosocial Assessment  Patient Complaints None  Eye Contact Fair  Facial Expression Flat  Affect Anxious  Speech Logical/coherent  Interaction Childlike  Motor Activity Shuffling  Appearance/Hygiene Improved  Behavior Characteristics Appropriate to situation  Mood Pleasant  Thought Process  Coherency WDL  Content WDL  Delusions None reported or observed  Perception WDL  Hallucination None reported or observed  Judgment Impaired  Confusion None  Danger to Self  Current suicidal ideation? Denies  Agreement Not to Harm Self Yes  Description of Agreement self  Danger to Others  Danger to Others None reported or observed

## 2023-03-08 NOTE — Progress Notes (Signed)
   03/08/23 1300  Spiritual Encounters  Type of Visit Initial  Care provided to: Patient  Conversation partners present during encounter Nurse  Referral source Chaplain assessment  Reason for visit Routine spiritual support  OnCall Visit No  Spiritual Framework  Presenting Themes Impactful experiences and emotions  Intervention Outcomes  Outcomes Autonomy/agency   Chaplain met with patient in the dayroom. Patient asked chaplain if I could talk to medical staff to ask about a key to her home. Patient is worried that no one is going by the house to feed her cat. Nurse shared with the chaplain that the social worker is working on that and has contacted someone about feeding her cat. Patient was happy to hear that.

## 2023-03-08 NOTE — Progress Notes (Signed)
[facility] MD Progress Note  03/08/2023 1:21 PM Helen Reid  MRN:  [id number] Subjective: Helen Reid is seen on rounds.  She is very negative today.  Is my first meeting with her.  She resides at [facility] and does outpatient at [facility] and possibly has ACT team.  She has been compliant with medications.  Nurses report she has not been an issue with anything except for complaints in general.  No side effects from her medicine. Principal Problem: MDD (major depressive disorder) Diagnosis: Principal Problem:   MDD (major depressive disorder) Active Problems:   MDD (major depressive disorder), recurrent severe, without psychosis (HCC)   Autism spectrum disorder   Moderate tetrahydrocannabinol (THC) dependence (HCC)  Total Time spent with patient: 15 minutes  Past Psychiatric History: History of depression  Past Medical History:  Past Medical History:  Diagnosis Date   Anxiety    Cancer (HCC)    lymphoma, gallbladder, breast   Depression    Hypertension    IBS (irritable bowel syndrome)    PTSD (post-traumatic stress disorder)    Vitamin D deficiency 09/27/2015    Past Surgical History:  Procedure Laterality Date   BREAST SURGERY     CHOLECYSTECTOMY     Family History:  Family History  Problem Relation Age of Onset   Heart disease Mother    Hypertension Mother    Asthma Mother    Heart disease Father    Family Psychiatric  History: Unremarkable Social History:  Social History   Substance and Sexual Activity  Alcohol Use Not Currently   Alcohol/week: 1.0 standard drink of alcohol   Types: 1 Glasses of wine per week     Social History   Substance and Sexual Activity  Drug Use Yes   Types: Marijuana, "Crack" cocaine    Social History   Socioeconomic History   Marital status: Single    Spouse name: Not on file   Number of children: 4   Years of education: Not on file   Highest education level: Not on file  Occupational History   Not on file  Tobacco  Use   Smoking status: Some Days    Current packs/day: 0.50    Average packs/day: 0.5 packs/day for 58.8 years (29.4 ttl pk-yrs)    Types: Cigarettes    Start date: 1966   Smokeless tobacco: Never  Substance and Sexual Activity   Alcohol use: Not Currently    Alcohol/week: 1.0 standard drink of alcohol    Types: 1 Glasses of wine per week   Drug use: Yes    Types: Marijuana, "Crack" cocaine   Sexual activity: Not Currently    Partners: Male  Other Topics Concern   Not on file  Social History Narrative   Not on file   Social Determinants of Health   Financial Resource Strain: Not on file  Food Insecurity: Food Insecurity Present (03/03/2023)   Hunger Vital Sign    Worried About Running Out of Food in the Last Year: Sometimes true    Ran Out of Food in the Last Year: Sometimes true  Transportation Needs: Unmet Transportation Needs (03/03/2023)   PRAPARE - Administrator, Civil Service (Medical): Yes    Lack of Transportation (Non-Medical): Yes  Physical Activity: Not on file  Stress: Not on file  Social Connections: Not on file   Additional Social History:  Sleep: Good  Appetite:  Good  Current Medications: Current Facility-Administered Medications  Medication Dose Route Frequency Provider Last Rate Last Admin   acetaminophen (TYLENOL) tablet 650 mg  650 mg Oral Q6H PRN Penn, Cranston Neighbor, NP   650 mg at 03/07/23 2121   albuterol (VENTOLIN HFA) 108 (90 Base) MCG/ACT inhaler 2 puff  2 puff Inhalation Q6H PRN Lewanda Rife, MD   2 puff at 03/05/23 0942   alum & mag hydroxide-simeth (MAALOX/MYLANTA) 200-200-20 MG/5ML suspension 30 mL  30 mL Oral Q4H PRN Penn, Cranston Neighbor, NP       atorvastatin (LIPITOR) tablet 20 mg  20 mg Oral Daily Lewanda Rife, MD   20 mg at 03/08/23 0802   busPIRone (BUSPAR) tablet 5 mg  5 mg Oral BID Lewanda Rife, MD   5 mg at 03/08/23 6962   diphenhydrAMINE (BENADRYL) capsule 50 mg  50 mg Oral TID PRN  Mcneil Sober, NP       Or   diphenhydrAMINE (BENADRYL) injection 50 mg  50 mg Intramuscular TID PRN Penn, Cranston Neighbor, NP       haloperidol (HALDOL) tablet 5 mg  5 mg Oral TID PRN Mcneil Sober, NP       Or   haloperidol lactate (HALDOL) injection 5 mg  5 mg Intramuscular TID PRN Mcneil Sober, NP       hydrOXYzine (ATARAX) tablet 25 mg  25 mg Oral Q6H PRN Lewanda Rife, MD   25 mg at 03/05/23 9528   levothyroxine (SYNTHROID) tablet 137 mcg  137 mcg Oral QAC breakfast Lewanda Rife, MD   137 mcg at 03/08/23 4132   LORazepam (ATIVAN) tablet 2 mg  2 mg Oral TID PRN Mcneil Sober, NP       Or   LORazepam (ATIVAN) injection 2 mg  2 mg Intramuscular TID PRN Penn, Cranston Neighbor, NP       magnesium hydroxide (MILK OF MAGNESIA) suspension 30 mL  30 mL Oral Daily PRN Penn, Cicely, NP       methocarbamol (ROBAXIN) tablet 500 mg  500 mg Oral Q6H PRN Sarina Ill, DO   500 mg at 03/08/23 1033   mirtazapine (REMERON) tablet 15 mg  15 mg Oral QHS Lewanda Rife, MD   15 mg at 03/07/23 2121   traZODone (DESYREL) tablet 50 mg  50 mg Oral QHS PRN Mcneil Sober, NP   50 mg at 03/07/23 2121    Lab Results: No results found for this or any previous visit (from the past 48 hour(s)).  Blood Alcohol level:  Lab Results  Component Value Date   ETH <10 03/02/2023   ETH <5 07/07/2015    Metabolic Disorder Labs: Lab Results  Component Value Date   HGBA1C 5.9 (H) 09/15/2021   No results found for: "PROLACTIN" Lab Results  Component Value Date   CHOL 390 (H) 09/15/2021   TRIG 86 09/15/2021   HDL 106 09/15/2021   CHOLHDL 3.7 09/15/2021   VLDL 16 05/27/2015   LDLCALC 272 (H) 09/15/2021   LDLCALC 154 (H) 05/27/2015    Physical Findings: AIMS:  , ,  ,  ,    CIWA:    COWS:     Musculoskeletal: Strength & Muscle Tone: within normal limits Gait & Station: normal Patient leans: N/A  Psychiatric Specialty Exam:  Presentation  General Appearance:  Appropriate for Environment  Eye  Contact: Fair  Speech: Clear and Coherent  Speech Volume: Normal  Handedness:No data recorded  Mood and Affect  Mood: Depressed; Anxious; Irritable  Affect: Congruent; Restricted; Tearful   Thought Process  Thought Processes: Coherent  Descriptions of Associations:Intact  Orientation:Full (Time, Place and Person)  Thought Content:Abstract Reasoning; Rumination  History of Schizophrenia/Schizoaffective disorder:No  Duration of Psychotic Symptoms:No data recorded Hallucinations:No data recorded Ideas of Reference:None  Suicidal Thoughts:No data recorded Homicidal Thoughts:No data recorded  Sensorium  Memory: Immediate Fair; Recent Fair  Judgment: Impaired  Insight: Fair   Chartered certified accountant: Fair  Attention Span: Fair  Recall: [employer] of Knowledge: Fair  Language: Fair   Psychomotor Activity  Psychomotor Activity:No data recorded  Assets  Assets: Communication Skills; Desire for Improvement; Resilience   Sleep  Sleep:No data recorded   Blood pressure 134/68, pulse 63, temperature 98.2 F (36.8 C), resp. rate 19, height 5\' 4"  (1.626 m), weight 71.4 kg, SpO2 100%. Body mass index is 27.03 kg/m.   Treatment Plan Summary: Daily contact with patient to assess and evaluate symptoms and progress in treatment, Medication management, and Plan continue current medication.  Dominic Mahaney Tresea Mall, DO 03/08/2023, 1:21 PM

## 2023-03-08 NOTE — Plan of Care (Signed)

## 2023-03-08 NOTE — Plan of Care (Signed)

## 2023-03-08 NOTE — Progress Notes (Signed)
Patient ID: Helen Reid, female   DOB: Sep 25, 1960, 62 y.o.   MRN: [id number] Pt presents with irritable, depressed mood, affect congruent. Pt denies any SI HI or AV Hallucinations. She does report feeling a lot of neck stiffness and states '' my neck just feels so tight. I don't know why this is going on but I need something else for it ! '' Pt offered tylenol and states '' that fucking shit doesn't work. I want a muscle relaxer. '' Pt also frustrated about her am meal, stating '' they didn't send me nothing to eat but a cold english muffin and potatos. '' Informed dining service and new meal ordered. MD notified of pt complaints of neck pain and given robaxin with some improvements. Pt is able to make her needs known, she is mobilizing well on the unit and eating and drinking well. Will con't to monitor. Pt is safe.
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
## 2023-03-09 DIAGNOSIS — F332 Major depressive disorder, recurrent severe without psychotic features: Secondary | ICD-10-CM | POA: Diagnosis not present

## 2023-03-09 MED ORDER — OLANZAPINE 5 MG PO TABS: 10.0000 mg | ORAL_TABLET | Freq: Four times a day (QID) | ORAL | Status: DC | PRN

## 2023-03-09 MED ORDER — MELATONIN 5 MG PO TABS
5.0000 mg | ORAL_TABLET | Freq: Every evening | ORAL | Status: DC | PRN
  Filled 2023-03-09: qty 1

## 2023-03-09 MED ORDER — LORAZEPAM 1 MG PO TABS
1.0000 mg | ORAL_TABLET | ORAL | Status: DC | PRN
  Administered 2023-03-10: 1 mg via ORAL
  Filled 2023-03-09: qty 1

## 2023-03-09 NOTE — Group Note (Signed)
Recreation Therapy Group Note   Group Topic:General Recreation  Group Date: 03/09/2023 Start Time: 1400 End Time: 1455 Facilitators: Rosina Lowenstein, LRT, CTRS Location: Courtyard  Group Description: Outdoor Recreation. Patients had the option to play corn hole, ring toss, playing cards or listening to music while outside in the courtyard getting fresh air and sunlight. Marland Kitchen LRT and patients discussed things that they enjoy doing in their free time outside of the hospital. LRT encouraged patients to drink water after being active and getting their heart rate up.   Goal Area(s) Addressed: Patient will identify leisure interests.  Patient will practice healthy decision making. Patient will engage in recreation activity.   Affect/Mood: N/A   Participation Level: Did not attend    Clinical Observations/Individualized Feedback: Saadia did not attend group.  Plan: Continue to engage patient in RT group sessions 2-3x/week.   Rosina Lowenstein, LRT, CTRS 03/09/2023 3:36 PM

## 2023-03-09 NOTE — Progress Notes (Signed)
[facility] MD Progress Note  03/09/2023 1:33 PM Helen Reid  MRN:  [id number] Subjective: Helen Reid Reasoner is seen on rounds.  She would like the trazodone discontinued and have melatonin available.  I told her that is fine.  She resides in a group home locally.  She gets easily agitated but does not want Haldol.  She currently denies any suicidal ideation.  Nurses report that she has been in good controls.  She denies any side effects from her medication. Principal Problem: MDD (major depressive disorder) Diagnosis: Principal Problem:   MDD (major depressive disorder) Active Problems:   MDD (major depressive disorder), recurrent severe, without psychosis (HCC)   Autism spectrum disorder   Moderate tetrahydrocannabinol (THC) dependence (HCC)  Total Time spent with patient: 15 minutes  Past Psychiatric History: Depression and autism  Past Medical History:  Past Medical History:  Diagnosis Date   Anxiety    Cancer (HCC)    lymphoma, gallbladder, breast   Depression    Hypertension    IBS (irritable bowel syndrome)    PTSD (post-traumatic stress disorder)    Vitamin D deficiency 09/27/2015    Past Surgical History:  Procedure Laterality Date   BREAST SURGERY     CHOLECYSTECTOMY     Family History:  Family History  Problem Relation Age of Onset   Heart disease Mother    Hypertension Mother    Asthma Mother    Heart disease Father    Family Psychiatric  History: Unremarkable Social History:  Social History   Substance and Sexual Activity  Alcohol Use Not Currently   Alcohol/week: 1.0 standard drink of alcohol   Types: 1 Glasses of wine per week     Social History   Substance and Sexual Activity  Drug Use Yes   Types: Marijuana, "Crack" cocaine    Social History   Socioeconomic History   Marital status: Single    Spouse name: Not on file   Number of children: 4   Years of education: Not on file   Highest education level: Not on file  Occupational History   Not on file   Tobacco Use   Smoking status: Some Days    Current packs/day: 0.50    Average packs/day: 0.5 packs/day for 58.8 years (29.4 ttl pk-yrs)    Types: Cigarettes    Start date: 1966   Smokeless tobacco: Never  Substance and Sexual Activity   Alcohol use: Not Currently    Alcohol/week: 1.0 standard drink of alcohol    Types: 1 Glasses of wine per week   Drug use: Yes    Types: Marijuana, "Crack" cocaine   Sexual activity: Not Currently    Partners: Male  Other Topics Concern   Not on file  Social History Narrative   Not on file   Social Determinants of Health   Financial Resource Strain: Not on file  Food Insecurity: Food Insecurity Present (03/03/2023)   Hunger Vital Sign    Worried About Running Out of Food in the Last Year: Sometimes true    Ran Out of Food in the Last Year: Sometimes true  Transportation Needs: Unmet Transportation Needs (03/03/2023)   PRAPARE - Administrator, Civil Service (Medical): Yes    Lack of Transportation (Non-Medical): Yes  Physical Activity: Not on file  Stress: Not on file  Social Connections: Not on file   Additional Social History:  Sleep: Good  Appetite:  Good  Current Medications: Current Facility-Administered Medications  Medication Dose Route Frequency Provider Last Rate Last Admin   acetaminophen (TYLENOL) tablet 650 mg  650 mg Oral Q6H PRN Penn, Cranston Neighbor, NP   650 mg at 03/07/23 2121   albuterol (VENTOLIN HFA) 108 (90 Base) MCG/ACT inhaler 2 puff  2 puff Inhalation Q6H PRN Lewanda Rife, MD   2 puff at 03/05/23 0942   alum & mag hydroxide-simeth (MAALOX/MYLANTA) 200-200-20 MG/5ML suspension 30 mL  30 mL Oral Q4H PRN Penn, Cranston Neighbor, NP       atorvastatin (LIPITOR) tablet 20 mg  20 mg Oral Daily Lewanda Rife, MD   20 mg at 03/09/23 0943   busPIRone (BUSPAR) tablet 5 mg  5 mg Oral BID Lewanda Rife, MD   5 mg at 03/09/23 0944   levothyroxine (SYNTHROID) tablet 137 mcg  137 mcg  Oral QAC breakfast Lewanda Rife, MD   137 mcg at 03/09/23 3244   LORazepam (ATIVAN) tablet 1 mg  1 mg Oral Q4H PRN Sarina Ill, DO       magnesium hydroxide (MILK OF MAGNESIA) suspension 30 mL  30 mL Oral Daily PRN Penn, Cranston Neighbor, NP       melatonin tablet 5 mg  5 mg Oral QHS PRN Sarina Ill, DO       methocarbamol (ROBAXIN) tablet 500 mg  500 mg Oral Q6H PRN Sarina Ill, DO   500 mg at 03/09/23 1003   mirtazapine (REMERON) tablet 15 mg  15 mg Oral QHS Lewanda Rife, MD   15 mg at 03/08/23 2141   OLANZapine (ZYPREXA) tablet 10 mg  10 mg Oral Q6H PRN Sarina Ill, DO        Lab Results: No results found for this or any previous visit (from the past 48 hour(s)).  Blood Alcohol level:  Lab Results  Component Value Date   ETH <10 03/02/2023   ETH <5 07/07/2015    Metabolic Disorder Labs: Lab Results  Component Value Date   HGBA1C 5.9 (H) 09/15/2021   No results found for: "PROLACTIN" Lab Results  Component Value Date   CHOL 390 (H) 09/15/2021   TRIG 86 09/15/2021   HDL 106 09/15/2021   CHOLHDL 3.7 09/15/2021   VLDL 16 05/27/2015   LDLCALC 272 (H) 09/15/2021   LDLCALC 154 (H) 05/27/2015    Physical Findings: AIMS:  , ,  ,  ,    CIWA:    COWS:     Musculoskeletal: Strength & Muscle Tone: within normal limits Gait & Station: normal Patient leans: N/A  Psychiatric Specialty Exam:  Presentation  General Appearance:  Appropriate for Environment  Eye Contact: Fair  Speech: Clear and Coherent  Speech Volume: Normal  Handedness:No data recorded  Mood and Affect  Mood: Depressed; Anxious; Irritable  Affect: Congruent; Restricted; Tearful   Thought Process  Thought Processes: Coherent  Descriptions of Associations:Intact  Orientation:Full (Time, Place and Person)  Thought Content:Abstract Reasoning; Rumination  History of Schizophrenia/Schizoaffective disorder:No  Duration of Psychotic Symptoms:No  data recorded Hallucinations:No data recorded Ideas of Reference:None  Suicidal Thoughts:No data recorded Homicidal Thoughts:No data recorded  Sensorium  Memory: Immediate Fair; Recent Fair  Judgment: Impaired  Insight: Fair   Chartered certified accountant: Fair  Attention Span: Fair  Recall: [employer] of Knowledge: Fair  Language: Fair   Psychomotor Activity  Psychomotor Activity:No data recorded  Assets  Assets: Communication Skills; Desire for Improvement; Resilience   Sleep  Sleep:No data recorded   Blood pressure (!) 111/50, pulse 63, temperature (!) 97.2 F (36.2 C), resp. rate 18, height 5\' 4"  (1.626 m), weight 71.4 kg, SpO2 100%. Body mass index is 27.03 kg/m.   Treatment Plan Summary: Daily contact with patient to assess and evaluate symptoms and progress in treatment, Medication management, and Plan discontinue Haldol and use Zyprexa for agitation.  Ativan as needed for anxiety.  Discontinue trazodone and start melatonin 5 mg at bedtime as needed for sleep.  Sarina Ill, DO 03/09/2023, 1:33 PM
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
## 2023-03-09 NOTE — Group Note (Signed)
Date:  03/09/2023 Time:  10:05 PM  Group Topic/Focus:  Identifying Needs:   The focus of this group is to help patients identify their personal needs that have been historically problematic and identify healthy behaviors to address their needs.    Participation Level:  Active  Participation Quality:  Appropriate  Affect:  Appropriate  Cognitive:  Appropriate  Insight: Appropriate  Engagement in Group:  Engaged  Modes of Intervention:  Education  Additional Comments:    Garry Heater 03/09/2023, 10:05 PM
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
## 2023-03-09 NOTE — BH IP Treatment Plan (Signed)
Interdisciplinary Treatment and Diagnostic Plan Update  03/09/2023 Time of Session: 9:30 AM  Tampatha Banger MRN: [id number]  Principal Diagnosis: MDD (major depressive disorder)  Secondary Diagnoses: Principal Problem:   MDD (major depressive disorder) Active Problems:   MDD (major depressive disorder), recurrent severe, without psychosis (HCC)   Autism spectrum disorder   Moderate tetrahydrocannabinol (THC) dependence (HCC)   Current Medications:  Current Facility-Administered Medications  Medication Dose Route Frequency Provider Last Rate Last Admin   acetaminophen (TYLENOL) tablet 650 mg  650 mg Oral Q6H PRN Penn, Cranston Neighbor, NP   650 mg at 03/07/23 2121   albuterol (VENTOLIN HFA) 108 (90 Base) MCG/ACT inhaler 2 puff  2 puff Inhalation Q6H PRN Lewanda Rife, MD   2 puff at 03/05/23 0942   alum & mag hydroxide-simeth (MAALOX/MYLANTA) 200-200-20 MG/5ML suspension 30 mL  30 mL Oral Q4H PRN Penn, Cranston Neighbor, NP       atorvastatin (LIPITOR) tablet 20 mg  20 mg Oral Daily Lewanda Rife, MD   20 mg at 03/09/23 0943   busPIRone (BUSPAR) tablet 5 mg  5 mg Oral BID Lewanda Rife, MD   5 mg at 03/09/23 0944   levothyroxine (SYNTHROID) tablet 137 mcg  137 mcg Oral QAC breakfast Lewanda Rife, MD   137 mcg at 03/09/23 6644   LORazepam (ATIVAN) tablet 1 mg  1 mg Oral Q4H PRN Sarina Ill, DO       magnesium hydroxide (MILK OF MAGNESIA) suspension 30 mL  30 mL Oral Daily PRN Penn, Cranston Neighbor, NP       melatonin tablet 5 mg  5 mg Oral QHS PRN Sarina Ill, DO       methocarbamol (ROBAXIN) tablet 500 mg  500 mg Oral Q6H PRN Sarina Ill, DO   500 mg at 03/09/23 1003   mirtazapine (REMERON) tablet 15 mg  15 mg Oral QHS Lewanda Rife, MD   15 mg at 03/08/23 2141   OLANZapine (ZYPREXA) tablet 10 mg  10 mg Oral Q6H PRN Sarina Ill, DO       PTA Medications: Medications Prior to Admission  Medication Sig Dispense Refill Last Dose   albuterol  (VENTOLIN HFA) 108 (90 Base) MCG/ACT inhaler Inhale 2 puffs into the lungs every 6 (six) hours as needed for wheezing or shortness of breath. 6.7 g 2    atorvastatin (LIPITOR) 20 MG tablet Take 1 tablet by mouth daily.      citalopram (CELEXA) 10 MG tablet Take 10 mg by mouth daily. (Patient not taking: Reported on 02/25/2022)      EPINEPHrine (EPIPEN 2-PAK) 0.3 mg/0.3 mL IJ SOAJ injection Inject 0.3 mg into the muscle as needed for anaphylaxis. 2 each 2    fluticasone-salmeterol (ADVAIR) 100-50 MCG/ACT AEPB Inhale 1 puff into the lungs 2 (two) times daily. (Patient not taking: Reported on 03/02/2023) 60 each 3    levothyroxine (SYNTHROID) 100 MCG tablet Take 1 tablet (100 mcg total) by mouth daily. (Patient not taking: Reported on 03/02/2023) 30 tablet 2    levothyroxine (SYNTHROID) 137 MCG tablet Take 137 mcg by mouth daily before breakfast.      QUEtiapine (SEROQUEL) 100 MG tablet Take 100 mg by mouth at bedtime. (Patient not taking: Reported on 02/08/2022)       Patient Stressors:    Patient Strengths:    Treatment Modalities: Medication Management, Group therapy, Case management,  1 to 1 session with clinician, Psychoeducation, Recreational therapy.   Physician Treatment Plan for Primary Diagnosis: MDD (major depressive  disorder) Long Term Goal(s): Improvement in symptoms so as ready for discharge   Short Term Goals: Ability to identify changes in lifestyle to reduce recurrence of condition will improve Ability to verbalize feelings will improve Ability to disclose and discuss suicidal ideas Ability to demonstrate self-control will improve Ability to identify and develop effective coping behaviors will improve Ability to maintain clinical measurements within normal limits will improve Compliance with prescribed medications will improve Ability to identify triggers associated with substance abuse/mental health issues will improve  Medication Management: Evaluate patient's response,  side effects, and tolerance of medication regimen.  Therapeutic Interventions: 1 to 1 sessions, Unit Group sessions and Medication administration.  Evaluation of Outcomes: Adequate for Discharge  Physician Treatment Plan for Secondary Diagnosis: Principal Problem:   MDD (major depressive disorder) Active Problems:   MDD (major depressive disorder), recurrent severe, without psychosis (HCC)   Autism spectrum disorder   Moderate tetrahydrocannabinol (THC) dependence (HCC)  Long Term Goal(s): Improvement in symptoms so as ready for discharge   Short Term Goals: Ability to identify changes in lifestyle to reduce recurrence of condition will improve Ability to verbalize feelings will improve Ability to disclose and discuss suicidal ideas Ability to demonstrate self-control will improve Ability to identify and develop effective coping behaviors will improve Ability to maintain clinical measurements within normal limits will improve Compliance with prescribed medications will improve Ability to identify triggers associated with substance abuse/mental health issues will improve     Medication Management: Evaluate patient's response, side effects, and tolerance of medication regimen.  Therapeutic Interventions: 1 to 1 sessions, Unit Group sessions and Medication administration.  Evaluation of Outcomes: Adequate for Discharge   RN Treatment Plan for Primary Diagnosis: MDD (major depressive disorder) Long Term Goal(s): Knowledge of disease and therapeutic regimen to maintain health will improve  Short Term Goals: Ability to remain free from injury will improve, Ability to verbalize frustration and anger appropriately will improve, Ability to demonstrate self-control, Ability to participate in decision making will improve, Ability to verbalize feelings will improve, Ability to disclose and discuss suicidal ideas, Ability to identify and develop effective coping behaviors will improve, and  Compliance with prescribed medications will improve  Medication Management: RN will administer medications as ordered by provider, will assess and evaluate patient's response and provide education to patient for prescribed medication. RN will report any adverse and/or side effects to prescribing provider.  Therapeutic Interventions: 1 on 1 counseling sessions, Psychoeducation, Medication administration, Evaluate responses to treatment, Monitor vital signs and CBGs as ordered, Perform/monitor CIWA, COWS, AIMS and Fall Risk screenings as ordered, Perform wound care treatments as ordered.  Evaluation of Outcomes: Adequate for Discharge   LCSW Treatment Plan for Primary Diagnosis: MDD (major depressive disorder) Long Term Goal(s): Safe transition to appropriate next level of care at discharge, Engage patient in therapeutic group addressing interpersonal concerns.  Short Term Goals: Engage patient in aftercare planning with referrals and resources, Increase social support, Increase ability to appropriately verbalize feelings, Increase emotional regulation, Facilitate acceptance of mental health diagnosis and concerns, Facilitate patient progression through stages of change regarding substance use diagnoses and concerns, Identify triggers associated with mental health/substance abuse issues, and Increase skills for wellness and recovery  Therapeutic Interventions: Assess for all discharge needs, 1 to 1 time with Social worker, Explore available resources and support systems, Assess for adequacy in community support network, Educate family and significant other(s) on suicide prevention, Complete Psychosocial Assessment, Interpersonal group therapy.  Evaluation of Outcomes: Adequate for Discharge  Progress in Treatment: Attending groups: Yes. and No. Participating in groups: Yes. and No. Taking medication as prescribed: Yes. Toleration medication: Yes. Family/Significant other contact made: Yes,  individual(s) contacted:  Red Christians, brother Patient understands diagnosis: Yes. Discussing patient identified problems/goals with staff: Yes. Medical problems stabilized or resolved: Yes. Denies suicidal/homicidal ideation: Yes. Issues/concerns per patient self-inventory: No. Other: None   New problem(s) identified: No, Describe:  None identified Update 03/09/23: No changes at this time    New Short Term/Long Term Goal(s): elimination of symptoms of psychosis, medication management for mood stabilization; elimination of SI thoughts; development of comprehensive mental wellness plan. Update 03/09/23: No changes at this time    Patient Goals:  " I want to be stabilized, I want to get better, I want to feel better" Update 03/09/23: No changes at this time    Discharge Plan or Barriers: CSW will assist with appropriate discharge planning Update 03/09/23: Pt wants to change her outpatient provider for therapy and psychiatry    Reason for Continuation of Hospitalization: Anxiety Depression Homicidal ideation Medication stabilization Suicidal ideation   Estimated Length of Stay: 1 to 7 days Update 03/09/23: TBD  Last 3 [place] Suicide Severity Risk Score: Flowsheet Row Admission (Current) from 03/03/2023 in [facility] BEHAVIORAL [facility] ED from 03[facility]02/2023 in [facility] Emergency Department at [facility] ED from 01/21/2022 in [facility] Emergency Department at [facility]  C-SSRS RISK CATEGORY Moderate Risk Moderate Risk No Risk       Last PHQ 2/9 Scores:    09/15/2021    2:53 PM 09/22/2015    8:11 AM 09/02/2015    4:03 PM  Depression screen PHQ 2/9  Decreased Interest 3 0 0  Down, Depressed, Hopeless 3 0 0  PHQ - 2 Score 6 0 0  Altered sleeping 3    Tired, decreased energy 3    Change in appetite 3    Feeling bad or failure about yourself  2    Trouble concentrating 3    Moving slowly or fidgety/restless 3    Suicidal thoughts 3    PHQ-9 Score 26     Difficult doing work/chores Extremely dIfficult      Scribe for Treatment Team: Elza Rafter, LCSWA 03/09/2023 11:21 AM

## 2023-03-10 DIAGNOSIS — F332 Major depressive disorder, recurrent severe without psychotic features: Secondary | ICD-10-CM | POA: Diagnosis not present

## 2023-03-10 MED ORDER — LORAZEPAM 1 MG PO TABS: 1.0000 mg | ORAL_TABLET | ORAL | 0 refills | Status: DC | PRN

## 2023-03-10 MED ORDER — METHOCARBAMOL 500 MG PO TABS: 500.0000 mg | ORAL_TABLET | Freq: Four times a day (QID) | ORAL | 0 refills | Status: AC | PRN

## 2023-03-10 MED ORDER — MELATONIN 5 MG PO TABS: 5.0000 mg | ORAL_TABLET | Freq: Every evening | ORAL | 0 refills | Status: DC | PRN

## 2023-03-10 MED ORDER — BUSPIRONE HCL 5 MG PO TABS: 5.0000 mg | ORAL_TABLET | Freq: Two times a day (BID) | ORAL | 3 refills | Status: AC

## 2023-03-10 MED ORDER — METHOCARBAMOL 500 MG PO TABS: 500.0000 mg | ORAL_TABLET | Freq: Four times a day (QID) | ORAL | 0 refills | Status: DC | PRN

## 2023-03-10 MED ORDER — LORAZEPAM 1 MG PO TABS: 1.0000 mg | ORAL_TABLET | ORAL | 0 refills | Status: AC | PRN

## 2023-03-10 MED ORDER — MELATONIN 5 MG PO TABS: 5.0000 mg | ORAL_TABLET | Freq: Every evening | ORAL | 0 refills | Status: AC | PRN

## 2023-03-10 MED ORDER — MIRTAZAPINE 15 MG PO TABS: 15.0000 mg | ORAL_TABLET | Freq: Every day | ORAL | 3 refills | Status: AC

## 2023-03-10 MED ORDER — MIRTAZAPINE 15 MG PO TABS: 15.0000 mg | ORAL_TABLET | Freq: Every day | ORAL | 3 refills | Status: DC

## 2023-03-10 MED ORDER — BUSPIRONE HCL 5 MG PO TABS: 5.0000 mg | ORAL_TABLET | Freq: Two times a day (BID) | ORAL | 3 refills | Status: DC

## 2023-03-10 NOTE — Progress Notes (Signed)
   03/10/23 0630  15 Minute Checks  Location Bedroom  Visual Appearance Calm  Behavior Sleeping  Sleep (Behavioral Health Patients Only)  Calculate sleep? (Click Yes once per 24 hr at 0600 safety check) Yes  Documented sleep last 24 hours 10.75

## 2023-03-10 NOTE — Plan of Care (Signed)
D: Pt alert and oriented. Pt reports experiencing anxiety however denies experiencing any depression at this time. Pt reports experiencing 10/10 chx pain at this time, prn medication given. Pt denies experiencing any SI/HI, or AVH at this time.   A: Scheduled medications administered to pt, per MD orders. Support and encouragement provided. Frequent verbal contact made. Routine safety checks conducted q15 minutes.   R: No adverse drug reactions noted. Pt verbally contracts for safety at this time. Pt compliant with medications and treatment plan. Pt interacts well with others on the unit. Pt remains safe at this time. Plan of care ongoing.  Problem: Education: Goal: Knowledge of General Education information will improve Description: Including pain rating scale, medication(s)/side effects and non-pharmacologic comfort measures Outcome: Progressing   Problem: Coping: Goal: Level of anxiety will decrease Outcome: Not Progressing

## 2023-03-10 NOTE — Progress Notes (Signed)
D: Pt alert and oriented. Pt denies experiencing any pain, SI/HI, or AVH at this time. Pt reports she will be able to keep herself safe when she returns home. Pt has completed a suicide safety plan and was given a survey to fill out. Pt Transition Record, AVS, and SRA has been reviewed and given to pt upon discharge. Pt does not desire for floor manager to contact pt upon discharge.   A: Pt received discharge and medication education/information. Pt belongings were returned and signed for at this time.   R: Pt verbalized understanding of discharge and medication education/information.  Pt escorted by staff to medical mall front lobby where pt was picked up by [name] and transported home.

## 2023-03-10 NOTE — Progress Notes (Signed)
D- Patient alert and oriented. C/o neck pain. PRN medication given as ordered.. Denies SI, HI, AVH, and pain. Quotes. Goal. How did they do with achieving previous goal. A- Scheduled medications administered to patient, per MD orders. Support and encouragement provided.  Routine safety checks conducted every 15 minutes.  Patient informed to notify staff with problems or concerns. R- No adverse drug reactions noted. Patient contracts for safety at this time. Patient compliant with medications and treatment plan. Patient receptive, calm, and cooperative. Patient interacts well with others on the unit.Slept well throughout the night. Patient remains safe at this time.

## 2023-03-10 NOTE — Plan of Care (Signed)

## 2023-03-10 NOTE — Progress Notes (Signed)
   03/09/23 2000  Psych Admission Type (Psych Patients Only)  Admission Status Voluntary  Psychosocial Assessment  Patient Complaints Anxiety  Eye Contact Fair  Facial Expression Sullen  Affect Sullen  Speech Logical/coherent  Interaction Childlike;Assertive  Motor Activity Shuffling  Appearance/Hygiene Unremarkable  Behavior Characteristics Appropriate to situation  Mood Irritable  Thought Process  Coherency WDL  Content WDL  Delusions None reported or observed  Perception WDL  Hallucination None reported or observed  Judgment Impaired  Confusion None  Danger to Self  Current suicidal ideation? Denies  Danger to Others  Danger to Others None reported or observed
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
## 2023-03-10 NOTE — BHH Suicide Risk Assessment (Signed)
[facility] Discharge Suicide Risk Assessment   Principal Problem: MDD (major depressive disorder) Discharge Diagnoses: Principal Problem:   MDD (major depressive disorder) Active Problems:   MDD (major depressive disorder), recurrent severe, without psychosis (HCC)   Autism spectrum disorder   Moderate tetrahydrocannabinol (THC) dependence (HCC)   Total Time spent with patient: 1 hour  Musculoskeletal: Strength & Muscle Tone: within normal limits Gait & Station: normal Patient leans: N/A  Psychiatric Specialty Exam  Presentation  General Appearance:  Appropriate for Environment  Eye Contact: Fair  Speech: Clear and Coherent  Speech Volume: Normal  Handedness:No data recorded  Mood and Affect  Mood: Depressed; Anxious; Irritable  Duration of Depression Symptoms: Greater than two weeks  Affect: Congruent; Restricted; Tearful   Thought Process  Thought Processes: Coherent  Descriptions of Associations:Intact  Orientation:Full (Time, Place and Person)  Thought Content:Abstract Reasoning; Rumination  History of Schizophrenia/Schizoaffective disorder:No  Duration of Psychotic Symptoms:No data recorded Hallucinations:No data recorded Ideas of Reference:None  Suicidal Thoughts:No data recorded Homicidal Thoughts:No data recorded  Sensorium  Memory: Immediate Fair; Recent Fair  Judgment: Impaired  Insight: Fair   Chartered certified accountant: Fair  Attention Span: Fair  Recall: [employer] of Knowledge: Fair  Language: Fair   Psychomotor Activity  Psychomotor Activity:No data recorded  Assets  Assets: Communication Skills; Desire for Improvement; Resilience   Sleep  Sleep:No data recorded   Blood pressure 130/67, pulse (!) 59, temperature 97.6 F (36.4 C), resp. rate 18, height 5\' 4"  (1.626 m), weight 71.4 kg, SpO2 100%. Body mass index is 27.03 kg/m.  Mental Status Per Nursing Assessment::   On Admission:   NA  Demographic Factors:  NA  Loss Factors: NA  Historical Factors: NA  Risk Reduction Factors:   NA  Continued Clinical Symptoms:  Bipolar Disorder:   Bipolar II  Cognitive Features That Contribute To Risk:  None    Suicide Risk:  Minimal: No identifiable suicidal ideation.  Patients presenting with no risk factors but with morbid ruminations; may be classified as minimal risk based on the severity of the depressive symptoms   Follow-up Information     [facility] Follow up on 03/17/2023.   Why: Your appointment is scheduled for 11:30 AM. Please remember to bring your insurance card. Contact information: [address] [place] [address] [phone]                 Plan Of Care/Follow-up recommendations: [facility]   Sarina Ill, DO 03/10/2023, 11:24 AM

## 2023-03-10 NOTE — Progress Notes (Signed)
  [facility] Adult Case Management Discharge Plan :  Will you be returning to the same living situation after discharge:  Yes,  pt will return home  At discharge, do you have transportation home?: Yes,  pt's adoptive mother will take her home.  [facility] will be meeting pt at her home with her keys  Do you have the ability to pay for your medications: Yes,  [employer]  Release of information consent forms completed and in the chart;  Patient's signature needed at discharge.  Patient to Follow up at:  Follow-up Information     [facility] Follow up on 03/17/2023.   Why: Your appointment is scheduled for 11:30 AM. Please remember to bring your insurance card. Contact information: [address] [place] [address] [phone]                 Next level of care provider has access to [facility] Link:no  Safety Planning and Suicide Prevention discussed: Yes,  Reauna Lavey, brother      Has patient been referred to the Quitline?: Yes, faxed/e-referral on 03/10/23  Patient has been referred for addiction treatment: No known substance use disorder.  [address], LCSWA 03/10/2023, 9:37 AM

## 2023-04-27 ENCOUNTER — Emergency Department
Admission: EM | Admit: 2023-04-27 | Discharge: 2023-04-28 | Disposition: A | Discharge status: Other Acute Inpatient Hospital | Payer: MEDICAID | Attending: Emergency Medicine | Admitting: Emergency Medicine

## 2023-04-27 ENCOUNTER — Other Ambulatory Visit: Payer: Self-pay

## 2023-04-27 DIAGNOSIS — F29 Unspecified psychosis not due to a substance or known physiological condition: Secondary | ICD-10-CM | POA: Insufficient documentation

## 2023-04-27 DIAGNOSIS — F411 Generalized anxiety disorder: Secondary | ICD-10-CM | POA: Diagnosis present

## 2023-04-27 DIAGNOSIS — F913 Oppositional defiant disorder: Secondary | ICD-10-CM | POA: Insufficient documentation

## 2023-04-27 DIAGNOSIS — F6381 Intermittent explosive disorder: Secondary | ICD-10-CM | POA: Diagnosis not present

## 2023-04-27 DIAGNOSIS — R45850 Homicidal ideations: Secondary | ICD-10-CM | POA: Diagnosis not present

## 2023-04-27 LAB — COMPREHENSIVE METABOLIC PANEL
ALT: 20 U/L (ref 0–44)
AST: 23 U/L (ref 15–41)
Albumin: 4.2 g/dL (ref 3.5–5.0)
Alkaline Phosphatase: 65 U/L (ref 38–126)
Anion gap: 9 (ref 5–15)
BUN: 19 mg/dL (ref 8–23)
CO2: 25 mmol/L (ref 22–32)
Calcium: 9.1 mg/dL (ref 8.9–10.3)
Chloride: 105 mmol/L (ref 98–111)
Creatinine, Ser: 1.04 mg/dL — ABNORMAL HIGH (ref 0.44–1.00)
GFR, Estimated: >60 mL/min (ref >60)
Glucose, Bld: 86 mg/dL (ref 70–99)
Potassium: 3.5 mmol/L (ref 3.5–5.1)
Sodium: 139 mmol/L (ref 135–145)
Total Bilirubin: 0.7 mg/dL (ref <1.2)
Total Protein: 7.6 g/dL (ref 6.5–8.1)

## 2023-04-27 LAB — URINE DRUG SCREEN, QUALITATIVE (ARMC ONLY)
Amphetamines, Ur Screen: NOT DETECTED
Barbiturates, Ur Screen: NOT DETECTED
Benzodiazepine, Ur Scrn: NOT DETECTED
Cannabinoid 50 Ng, Ur ~~LOC~~: POSITIVE — AB
Cocaine Metabolite,Ur ~~LOC~~: NOT DETECTED
MDMA (Ecstasy)Ur Screen: NOT DETECTED
Methadone Scn, Ur: NOT DETECTED
Opiate, Ur Screen: NOT DETECTED
Phencyclidine (PCP) Ur S: NOT DETECTED
Tricyclic, Ur Screen: NOT DETECTED

## 2023-04-27 LAB — CBC
HCT: 37.7 % (ref 36.0–46.0)
Hemoglobin: 12 g/dL (ref 12.0–15.0)
MCH: 22.8 pg — ABNORMAL LOW (ref 26.0–34.0)
MCHC: 31.8 g/dL (ref 30.0–36.0)
MCV: 71.5 fL — ABNORMAL LOW (ref 80.0–100.0)
Platelets: 213 10*3/uL (ref 150–400)
RBC: 5.27 MIL/uL — ABNORMAL HIGH (ref 3.87–5.11)
RDW: 15.1 % (ref 11.5–15.5)
WBC: 4.3 10*3/uL (ref 4.0–10.5)
nRBC: 0 % (ref 0.0–0.2)

## 2023-04-27 LAB — ETHANOL: Alcohol, Ethyl (B): <10 mg/dL (ref <10)

## 2023-04-27 NOTE — ED Triage Notes (Signed)
Patient to ED with [facility] under IVC. Patient made comments to [facility] that she has multiple personalities, was throwing things and making threats to staff. Patient denies SI/HI.

## 2023-04-27 NOTE — ED Notes (Signed)
Pt moved to BHU1 from the quad area.  Pt irritable on arrival.   Pt informed of location of bathroom and cameras on the unit.  Pt reports she is going to sleep now.

## 2023-04-27 NOTE — ED Notes (Signed)
Pt wanded and roomed. Pt requested food. Sandwich tray and sprite provided. Diet placed and dietary contacted for a tray.

## 2023-04-27 NOTE — ED Notes (Signed)
IVC paper work in An[employer] and to e court

## 2023-04-27 NOTE — ED Notes (Addendum)
IVC patient has bed at [facility]  [address]  Lanae Boast Stephenson  12/12  no transport available today 12/11 Dr. Sherrian Divers accepting [phone]

## 2023-04-27 NOTE — ED Provider Notes (Signed)
[facility] Provider Note    Event Date/Time   First MD Initiated Contact with Patient 04/27/23 1630     (approximate)   History   Psychiatric Evaluation   HPI  Helen Reid is a 62 y.o. female   Past medical history of PTSD and anxiety/depression who presents to the emergency department under IVC due to multiple personalities and acute psychosis, stating that one of the personalities is dangerous and is going to hurt others.  She denies any suicidality.  She denies homicidality but states that one of her other personalities is homicidal.  She denies any drug or alcohol use.  She denies any ingestions.  She denies any self-harm today.  She denies any other acute medical complaints.   External Medical Documents Reviewed: IVC  paperwork that accompanies the patient documenting the above      Physical Exam   Triage Vital Signs: ED Triage Vitals  Encounter Vitals Group     BP 04/27/23 1614 (!) 147/74     Systolic BP Percentile --      Diastolic BP Percentile --      Pulse Rate 04/27/23 1614 61     Resp 04/27/23 1614 18     Temp 04/27/23 1614 98.1 F (36.7 C)     Temp Source 04/27/23 1614 Oral     SpO2 04/27/23 1614 100 %     Weight --      Height --      Head Circumference --      Peak Flow --      Pain Score 04/27/23 1613 0     Pain Loc --      Pain Education --      Exclude from Growth Chart --     Most recent vital signs: Vitals:   04/27/23 1614  BP: (!) 147/74  Pulse: 61  Resp: 18  Temp: 98.1 F (36.7 C)  SpO2: 100%    General: Awake, no distress.  CV:  Good peripheral perfusion.  Resp:  Normal effort.  Abd:  No distention.  Other:  Walking around the room with steady gait.  Moving all extremities.  No obvious signs of head trauma.   ED Results / Procedures / Treatments   Labs (all labs ordered are listed, but only abnormal results are displayed) Labs Reviewed  COMPREHENSIVE METABOLIC PANEL - Abnormal; Notable for the  following components:      Result Value   Creatinine, Ser 1.04 (*)    All other components within normal limits  CBC - Abnormal; Notable for the following components:   RBC 5.27 (*)    MCV 71.5 (*)    MCH 22.8 (*)    All other components within normal limits  ETHANOL  URINE DRUG SCREEN, QUALITATIVE ([facility] ONLY)     I ordered and reviewed the above labs they are notable for cell counts and electrolytes largely unremarkable   PROCEDURES:  Critical Care performed: No  Procedures   MEDICATIONS ORDERED IN ED: Medications - No data to display  External physician / consultants:  I spoke with psych consultant regarding care plan for this patient.   IMPRESSION / MDM / ASSESSMENT AND PLAN / ED COURSE  I reviewed the triage vital signs and the nursing notes.                                Patient's presentation is most consistent with acute  presentation with potential threat to life or bodily function.  Differential diagnosis includes, but is not limited to, acute decompensated psychiatric illness, substance-induced mood disorder   The patient is on the cardiac monitor to evaluate for evidence of arrhythmia and/or significant heart rate changes.  MDM:    With psychiatric history here with IVC for psychosis multiple personalities and homicidality from one of the personalities.  No acute medical complaints.  Basic labs ordered reviewed unremarkable.  Cleared for psychiatric evaluation.        FINAL CLINICAL IMPRESSION(S) / ED DIAGNOSES   Final diagnoses:  Psychosis, unspecified psychosis type (HCC)     Rx / DC Orders   ED Discharge Orders     None        Note:  This document was prepared using Dragon voice recognition software and may include unintentional dictation errors.    Pilar Jarvis, MD 04/27/23 [phone]

## 2023-04-27 NOTE — ED Triage Notes (Signed)
First nurse note: Pt to ED via [facility] under IVC. Pt was [facility]Cd by [facility]. [facility] reporting pt has bed available tomorrow at [facility]. Pt with hx DID.
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
## 2023-04-27 NOTE — ED Notes (Signed)
On person: Black sandals Tan pants Tan/white/Burgundy sweater Black robe with belt Blue panties Beige bra (2)  Zip lock bag: Phone Keys Earrings (4) Rings (3) Necklaces (2)  Pt dressed out by EDT's keke and caitlyn

## 2023-04-27 NOTE — Consult Note (Signed)
[facility] Face-to-Face Psychiatry Consult   Reason for Consult:  Admit Referring physician: Dr. 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 Pt presents to [facility] under IVC petition. IVC petitioner is [name], responding MH crisis counselor. Per IVC petition:  COUNSELOR RESPONDED TO THE 2ND CALL IN 2 DAYS FOR THIS RESPONDENT. AT HER DR OFFICE SHE WAS MAKING THREATS AND THROWING THINGS. THE RESPONDENT HAS 4 PERSONALITIES THAT ALL HAVE NAMES. TODAY "KP" WHO CANNOT SPEAK, COMMUNICATED BY PAPER THAT "INEZ" WAS WANTING TO HURT "[name]". INEZ ALSO WANTED TO GO AFTER PAYEE, INEZ IS KNOWN TO BE VIOLENT. THE RESPONDENT HAS BEEN TRYING TO FIGHT OFF INEZ FOR SEVERAL DAYS. SHE STATED THAT YESTERDAY INEZ WANTED TO HURT A FORMER CO-WORKER AND IF INEZ BECAME VIOLENT SHE WOULD GET OUT OF HER WAY BECAUSE SHE FEELS THAT INEZ ALWAYS HAS A PLAN. SHE STATED THAT YESTERDAY INEZ WANTED TO STEAL A CAR TO GO TO [place] TO GO AFTER THE PAYEE. THE RESPONDENT HAS ASKED FOR HELP SEVERAL TIMES TODAY FROM THE REPSONDING COUNSELOR FROM [employer] WHOM SHE KNOWS FROM HER HISTORY.   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 When asked which personality she is referring to, states "Inez". Reports she has several personalities including [place], [place], [place], [place], and [place]. Reports Helen Reid is very upset because of situation with her payee. States her payee is not paying her rent on time and that she was recently served an eviction notice. Now, Helen Reid is experiencing homicidal  ideation towards payee. Reports Helen Reid is also experiencing homicidal ideation towards "Ms. Annette". She does not divulge any type of plan at this time. She reports she is not experiencing homicidal ideation but that Helen Reid is. Denies that she or other personalities are experiencing suicidal ideations, auditory visual hallucinations. She does feel that Helen Reid is paranoid "all the time, trusts nobody". When asked about report about behavioral outburst from IVC, pt reports that [place] did yell. She reports she is not sleeping well and has poor appetite.  She reports she used to be seen at [facility] for outpatient behavioral health services. Is currently followed by [facility]. She is in the process of getting connected at [facility].  Attempted collateral with IVC petitioner, [name], [phone]. However unable to reach her.   Discussed with pt she is currently under involuntary commitment and recommended for inpatient psychiatric admission. Discussed she has been accepted to [facility] for inpatient psychiatric admission. She verbalized understanding of treatment plan.  Past Psychiatric History:  past psychiatric diagnoses have included tobacco use disorder, major depressive disorder, cannabis use disorder, post traumatic stress disorder, borderline personality disorder, anxiety, autism spectrum disorder (unclear if pt had neuropsych testing completed)   Risk to Self:  Denies suicidal ideations Risk to Others:  Denies that she is experiencing homicidal ideations although reports personality "Helen Reid" is experiencing homicidal ideation towards payee and "Ms. Drinda Butts" Prior Inpatient Therapy:  Yes Prior Outpatient Therapy:  Yes  Past Medical History:  Past Medical History:  Diagnosis Date   Anxiety    Cancer (HCC)    lymphoma, gallbladder, breast  Depression    Hypertension    IBS (irritable bowel syndrome)    PTSD (post-traumatic stress disorder)    Vitamin D deficiency 09/27/2015     Past Surgical History:  Procedure Laterality Date   BREAST SURGERY     CHOLECYSTECTOMY     Family History:  Family History  Problem Relation Age of Onset   Heart disease Mother    Hypertension Mother    Asthma Mother    Heart disease Father    Family Psychiatric  History: Reports mother diagnosed with schizophrenia Social History:  Social History   Substance and Sexual Activity  Alcohol Use Not Currently   Alcohol/week: 1.0 standard drink of alcohol   Types: 1 Glasses of wine per week     Social History   Substance and Sexual Activity  Drug Use Yes   Types: Marijuana, "Crack" cocaine    Social History   Socioeconomic History   Marital status: Single    Spouse name: Not on file   Number of children: 4   Years of education: Not on file   Highest education level: Not on file  Occupational History   Not on file  Tobacco Use   Smoking status: Some Days    Current packs/day: 0.50    Average packs/day: 0.5 packs/day for 58.9 years (29.5 ttl pk-yrs)    Types: Cigarettes    Start date: 1966   Smokeless tobacco: Never  Substance and Sexual Activity   Alcohol use: Not Currently    Alcohol/week: 1.0 standard drink of alcohol    Types: 1 Glasses of wine per week   Drug use: Yes    Types: Marijuana, "Crack" cocaine   Sexual activity: Not Currently    Partners: Male  Other Topics Concern   Not on file  Social History Narrative   Not on file   Social Determinants of Health   Financial Resource Strain: Not on file  Food Insecurity: Food Insecurity Present (03/03/2023)   Hunger Vital Sign    Worried About Running Out of Food in the Last Year: Sometimes true    Ran Out of Food in the Last Year: Sometimes true  Transportation Needs: Unmet Transportation Needs (03/03/2023)   PRAPARE - Administrator, Civil Service (Medical): Yes    Lack of Transportation (Non-Medical): Yes  Physical Activity: Not on file  Stress: Not on file  Social Connections: Not on  file   Allergies:   Allergies  Allergen Reactions   Peanut-Containing Drug Products Shortness Of Breath   Penicillins Anaphylaxis    Has patient had a PCN reaction causing immediate rash, facial/tongue/throat swelling, SOB or lightheadedness with hypotension: Yes Has patient had a PCN reaction causing severe rash involving mucus membranes or skin necrosis: No Has patient had a PCN reaction that required hospitalization Yes Has patient had a PCN reaction occurring within the last 10 years: No If all of the above answers are "NO", then may proceed with Cephalosporin use.   Aspirin Swelling   Dilantin  [Phenytoin] Nausea And Vomiting   Macrolides And Ketolides Swelling   Phenobarbital Nausea And Vomiting   Labs:  Results for orders placed or performed during the hospital encounter of 04/27/23 (from the past 48 hour(s))  Comprehensive metabolic panel     Status: Abnormal   Collection Time: 04/27/23  4:31 PM  Result Value Ref Range   Sodium 139 135 - 145 mmol/L   Potassium 3.5 3.5 - 5.1 mmol/L   Chloride 105 98 -  111 mmol/L   CO2 25 22 - 32 mmol/L   Glucose, Bld 86 70 - 99 mg/dL    Comment: Glucose reference range applies only to samples taken after fasting for at least 8 hours.   BUN 19 8 - 23 mg/dL   Creatinine, Ser 7.82 (H) 0.44 - 1.00 mg/dL   Calcium 9.1 8.9 - 95.6 mg/dL   Total Protein 7.6 6.5 - 8.1 g/dL   Albumin 4.2 3.5 - 5.0 g/dL   AST 23 15 - 41 U/L   ALT 20 0 - 44 U/L   Alkaline Phosphatase 65 38 - 126 U/L   Total Bilirubin 0.7 <1.2 mg/dL   GFR, Estimated >21 >30 mL/min    Comment: (NOTE) Calculated using the CKD-EPI Creatinine Equation (2021)    Anion gap 9 5 - 15    Comment: Performed at [facility], [address] [place], [place] [address]  cbc     Status: Abnormal   Collection Time: 04/27/23  4:31 PM  Result Value Ref Range   WBC 4.3 4.0 - 10.5 K/uL   RBC 5.27 (H) 3.87 - 5.11 MIL/uL   Hemoglobin 12.0 12.0 - 15.0 g/dL   HCT 46.9 62.9 - 52.8 %    MCV 71.5 (L) 80.0 - 100.0 fL   MCH 22.8 (L) 26.0 - 34.0 pg   MCHC 31.8 30.0 - 36.0 g/dL   RDW 41.3 24.4 - 01.0 %   Platelets 213 150 - 400 K/uL   nRBC 0.0 0.0 - 0.2 %    Comment: Performed at [facility], [address] [place], [place] [address]    No current facility-administered medications for this encounter.   Current Outpatient Medications  Medication Sig Dispense Refill   albuterol (VENTOLIN HFA) 108 (90 Base) MCG/ACT inhaler Inhale 2 puffs into the lungs every 6 (six) hours as needed for wheezing or shortness of breath. 6.7 g 2   atorvastatin (LIPITOR) 20 MG tablet Take 1 tablet by mouth daily.     busPIRone (BUSPAR) 5 MG tablet Take 1 tablet (5 mg total) by mouth 2 (two) times daily. 60 tablet 3   EPINEPHrine (EPIPEN 2-PAK) 0.3 mg/0.3 mL IJ SOAJ injection Inject 0.3 mg into the muscle as needed for anaphylaxis. 2 each 2   fluticasone-salmeterol (ADVAIR) 100-50 MCG/ACT AEPB Inhale 1 puff into the lungs 2 (two) times daily. (Patient not taking: Reported on 03/02/2023) 60 each 3   levothyroxine (SYNTHROID) 100 MCG tablet Take 1 tablet (100 mcg total) by mouth daily. (Patient not taking: Reported on 03/02/2023) 30 tablet 2   levothyroxine (SYNTHROID) 137 MCG tablet Take 137 mcg by mouth daily before breakfast.     LORazepam (ATIVAN) 1 MG tablet Take 1 tablet (1 mg total) by mouth every 4 (four) hours as needed for anxiety. 30 tablet 0   melatonin 5 MG TABS Take 1 tablet (5 mg total) by mouth at bedtime as needed. 30 tablet 0   methocarbamol (ROBAXIN) 500 MG tablet Take 1 tablet (500 mg total) by mouth every 6 (six) hours as needed for muscle spasms. 30 tablet 0   mirtazapine (REMERON) 15 MG tablet Take 1 tablet (15 mg total) by mouth at bedtime. 30 tablet 3   Musculoskeletal: Strength & Muscle Tone: within normal limits Gait & Station: normal Patient leans: N/A  Psychiatric Specialty Exam:  Presentation  General Appearance:  Appropriate for Environment  Eye  Contact: Minimal  Speech: Clear and Coherent  Speech Volume: Increased  Handedness: Right  Mood and Affect  Mood: -- ("angry")  Affect: Labile  Thought Process  Thought Processes: Coherent  Descriptions of Associations:Intact  Orientation:Full (Time, Place and Person)  Thought Content:Logical  History of Schizophrenia/Schizoaffective disorder:No  Duration of Psychotic Symptoms: Not applicable Hallucinations:Hallucinations: None  Ideas of Reference:None  Suicidal Thoughts:Suicidal Thoughts: No  Homicidal Thoughts:Homicidal Thoughts: -- (reports current personality is not experiencing HI but that "Helen Reid" is)  Sensorium  Memory: Immediate Fair  Judgment: Poor  Insight: Poor  Executive Functions  Concentration: Fair  Attention Span: Fair  Recall: [employer] of Knowledge: Fair  Language: Fair  Psychomotor Activity  Psychomotor Activity: Psychomotor Activity: Mannerisms  Assets  Assets: Communication Skills; Desire for Improvement; Resilience  Sleep  Sleep: Sleep: Poor  Physical Exam: Physical Exam Constitutional:      General: She is not in acute distress.    Appearance: She is not ill-appearing, toxic-appearing or diaphoretic.  Eyes:     General: No scleral icterus. Cardiovascular:     Rate and Rhythm: Normal rate.  Pulmonary:     Effort: Pulmonary effort is normal. No respiratory distress.  Neurological:     Mental Status: She is alert and oriented to person, place, and time.  Psychiatric:        Attention and Perception: Attention and perception normal.        Mood and Affect: Affect is labile and angry.        Behavior: Behavior is agitated. Behavior is cooperative.        Thought Content: Thought content is not paranoid or delusional. Thought content includes homicidal ideation. Thought content does not include suicidal ideation.    Review of Systems  Constitutional:  Negative for fever.  Respiratory:  Negative for  shortness of breath.   Cardiovascular:  Negative for chest pain.  Gastrointestinal:  Negative for abdominal pain.  Neurological:  Negative for headaches.  Psychiatric/Behavioral:  The patient has insomnia.    Blood pressure (!) 147/74, pulse 61, temperature 98.1 F (36.7 C), temperature source Oral, resp. rate 18, SpO2 100%. There is no height or weight on file to calculate BMI.  Treatment Plan Summary: Plan    62 y/o female admitted to [facility] under IVC petition. Pt recommended for inpatient psychiatric admission. Has bed at [facility] for tomorrow. Pharmacy reconciliation not yet completed. Will need home medications re-started.   Disposition: Recommend psychiatric Inpatient admission when medically cleared. Supportive therapy provided about ongoing stressors.  Lauree Chandler, NP 04/27/2023 5:57 PM

## 2023-04-28 MED ORDER — MELATONIN 5 MG PO TABS: 5.0000 mg | ORAL_TABLET | Freq: Every evening | ORAL | Status: DC | PRN

## 2023-04-28 MED ORDER — LEVOTHYROXINE SODIUM 137 MCG PO TABS
137.0000 ug | ORAL_TABLET | Freq: Every day | ORAL | Status: DC
Start: 2023-04-28 — End: 2023-04-28
  Administered 2023-04-28: 137 ug via ORAL
  Filled 2023-04-28: qty 1

## 2023-04-28 MED ORDER — MIRTAZAPINE 15 MG PO TABS: 15.0000 mg | ORAL_TABLET | Freq: Every day | ORAL | Status: DC

## 2023-04-28 MED ORDER — ATORVASTATIN CALCIUM 20 MG PO TABS
20.0000 mg | ORAL_TABLET | Freq: Every day | ORAL | Status: DC
  Administered 2023-04-28: 20 mg via ORAL
  Filled 2023-04-28: qty 1

## 2023-04-28 MED ORDER — METHOCARBAMOL 500 MG PO TABS
500.0000 mg | ORAL_TABLET | Freq: Four times a day (QID) | ORAL | Status: DC | PRN
Start: 2023-04-28 — End: 2023-04-28
  Administered 2023-04-28: 500 mg via ORAL
  Filled 2023-04-28: qty 1

## 2023-04-28 MED ORDER — BUSPIRONE HCL 10 MG PO TABS
5.0000 mg | ORAL_TABLET | Freq: Two times a day (BID) | ORAL | Status: DC
  Administered 2023-04-28: 5 mg via ORAL
  Filled 2023-04-28: qty 1

## 2023-04-28 MED ORDER — LORAZEPAM 1 MG PO TABS
1.0000 mg | ORAL_TABLET | ORAL | Status: DC | PRN
Start: 2023-04-28 — End: 2023-04-28
  Administered 2023-04-28: 1 mg via ORAL
  Filled 2023-04-28: qty 1

## 2023-04-28 MED ORDER — ALBUTEROL SULFATE HFA 108 (90 BASE) MCG/ACT IN AERS: 2.0000 | INHALATION_SPRAY | Freq: Four times a day (QID) | RESPIRATORY_TRACT | Status: DC | PRN

## 2023-04-28 NOTE — ED Notes (Signed)
Pt provided with breakfast tray and updated on plan of care. Pt denies any other needs. Pt verbalized understanding.

## 2023-04-28 NOTE — ED Notes (Signed)
Report given to Museum/gallery curator at [facility]

## 2023-04-28 NOTE — ED Notes (Signed)
[place]  [employer]  called  for  transport  to  [facility]

## 2023-04-28 NOTE — ED Notes (Signed)
Pt in bed .

## 2023-04-28 NOTE — ED Notes (Signed)
PT IVC/ PENDING PLACEMEN. PT HAS BEEN ACCEPTED AT [facility] ANYTIME TODAY AFTER 9:00 AM.

## 2023-04-28 NOTE — ED Provider Notes (Signed)
Emergency Medicine Observation Re-evaluation Note  Helen Reid is a 62 y.o. female, seen on rounds today.  Pt initially presented to the ED for complaints of Psychiatric Evaluation  Currently, the patient is no acute distress. Pt asleep in bed-no concerns per [facility] nurse.  Patient is planned to be transferred will this afternoon. Physical Exam  Blood pressure 119/60, pulse (!) 50, temperature 97.9 F (36.6 C), temperature source Oral, resp. rate 18, SpO2 96%.  Physical Exam General: No apparent distress Pulm: Normal WOB Psych: resting     ED Course / MDM     I have reviewed the labs performed to date as well as medications administered while in observation.  Recent changes in the last 24 hours include none   Plan   Current plan is to continue to wait for psych transfer  Patient is under full IVC at this time.   Concha Se, MD 04/28/23 [phone]

## 2023-04-28 NOTE — ED Notes (Signed)
Attempted to call report. Nurses dealing with situation. Name and number provided to [facility] -  who will relay message to nurses to give call back.

## 2023-04-28 NOTE — ED Notes (Signed)
Pt sleeping at this time. Environment secured.

## 2023-04-28 NOTE — ED Notes (Signed)
Pt walked to restroom 

## 2023-04-28 NOTE — ED Notes (Signed)
Patient came to door and asked this nurse for her psychiatric medications with raised voice and clear agitation. Pt informed that her regularly scheduled medications are not due until 10am but patient has Ativan available for agitation to which she requested to take. After taking Ativan, patient stated she was in pain(chronic) and her muscle relaxer was most effective. Given at this time. Patient TV turned on and informed of plan to go to [facility] today pending transport availability.

## 2023-07-01 ENCOUNTER — Other Ambulatory Visit: Payer: Self-pay | Admitting: Neurosurgery

## 2023-07-01 DIAGNOSIS — M5416 Radiculopathy, lumbar region: Secondary | ICD-10-CM

## 2023-07-12 ENCOUNTER — Ambulatory Visit: Admission: RE | Admit: 2023-07-12 | Payer: MEDICAID | Source: Ambulatory Visit
